# Patient Record
Sex: Female | Born: 1975 | Race: Black or African American | Hispanic: No | Marital: Single | Smoking: Never smoker
Health system: Southern US, Community
[De-identification: ages and names within clinical notes are randomized; demographics above are authoritative.]

## PROBLEM LIST (undated history)

## (undated) DIAGNOSIS — A6009 Herpesviral infection of other urogenital tract: Secondary | ICD-10-CM

## (undated) DIAGNOSIS — E785 Hyperlipidemia, unspecified: Secondary | ICD-10-CM

## (undated) DIAGNOSIS — J0100 Acute maxillary sinusitis, unspecified: Secondary | ICD-10-CM

## (undated) DIAGNOSIS — IMO0002 Reserved for concepts with insufficient information to code with codable children: Secondary | ICD-10-CM

## (undated) DIAGNOSIS — D649 Anemia, unspecified: Secondary | ICD-10-CM

## (undated) DIAGNOSIS — A5901 Trichomonal vulvovaginitis: Secondary | ICD-10-CM

## (undated) DIAGNOSIS — L309 Dermatitis, unspecified: Secondary | ICD-10-CM

## (undated) DIAGNOSIS — J209 Acute bronchitis, unspecified: Secondary | ICD-10-CM

## (undated) DIAGNOSIS — R112 Nausea with vomiting, unspecified: Secondary | ICD-10-CM

## (undated) DIAGNOSIS — R229 Localized swelling, mass and lump, unspecified: Secondary | ICD-10-CM

## (undated) DIAGNOSIS — Z9889 Other specified postprocedural states: Secondary | ICD-10-CM

## (undated) DIAGNOSIS — I10 Essential (primary) hypertension: Secondary | ICD-10-CM

## (undated) DIAGNOSIS — R7303 Prediabetes: Secondary | ICD-10-CM

## (undated) DIAGNOSIS — N390 Urinary tract infection, site not specified: Secondary | ICD-10-CM

## (undated) HISTORY — DX: Essential (primary) hypertension: I10

## (undated) HISTORY — DX: Reserved for concepts with insufficient information to code with codable children: IMO0002

## (undated) HISTORY — DX: Hyperlipidemia, unspecified: E78.5

## (undated) HISTORY — PX: BREAST SURGERY: SHX581

## (undated) HISTORY — DX: Localized swelling, mass and lump, unspecified: R22.9

## (undated) HISTORY — PX: WISDOM TOOTH EXTRACTION: SHX21

---

## 1998-12-24 ENCOUNTER — Encounter: Payer: Self-pay | Admitting: Emergency Medicine

## 1998-12-24 ENCOUNTER — Emergency Department (HOSPITAL_COMMUNITY): Admission: EM | Admit: 1998-12-24 | Discharge: 1998-12-24 | Payer: Self-pay | Admitting: Emergency Medicine

## 1999-03-02 ENCOUNTER — Inpatient Hospital Stay (HOSPITAL_COMMUNITY): Admission: AD | Admit: 1999-03-02 | Discharge: 1999-03-02 | Payer: Self-pay | Admitting: *Deleted

## 2000-04-02 ENCOUNTER — Emergency Department (HOSPITAL_COMMUNITY): Admission: EM | Admit: 2000-04-02 | Discharge: 2000-04-02 | Payer: Self-pay | Admitting: Emergency Medicine

## 2000-04-03 ENCOUNTER — Encounter: Payer: Self-pay | Admitting: Emergency Medicine

## 2000-12-05 ENCOUNTER — Inpatient Hospital Stay (HOSPITAL_COMMUNITY): Admission: AD | Admit: 2000-12-05 | Discharge: 2000-12-05 | Payer: Self-pay | Admitting: Obstetrics & Gynecology

## 2001-08-05 HISTORY — PX: BREAST EXCISIONAL BIOPSY: SUR124

## 2001-08-24 ENCOUNTER — Inpatient Hospital Stay (HOSPITAL_COMMUNITY): Admission: AD | Admit: 2001-08-24 | Discharge: 2001-08-24 | Payer: Self-pay | Admitting: Obstetrics & Gynecology

## 2002-05-18 ENCOUNTER — Inpatient Hospital Stay (HOSPITAL_COMMUNITY): Admission: AD | Admit: 2002-05-18 | Discharge: 2002-05-18 | Payer: Self-pay | Admitting: *Deleted

## 2002-05-20 ENCOUNTER — Encounter: Admission: RE | Admit: 2002-05-20 | Discharge: 2002-05-20 | Payer: Self-pay | Admitting: Obstetrics and Gynecology

## 2002-05-21 ENCOUNTER — Encounter: Admission: RE | Admit: 2002-05-21 | Discharge: 2002-05-21 | Payer: Self-pay | Admitting: Family Medicine

## 2002-05-21 ENCOUNTER — Encounter: Payer: Self-pay | Admitting: Family Medicine

## 2002-06-15 ENCOUNTER — Encounter (HOSPITAL_BASED_OUTPATIENT_CLINIC_OR_DEPARTMENT_OTHER): Payer: Self-pay | Admitting: General Surgery

## 2002-06-21 ENCOUNTER — Encounter (HOSPITAL_BASED_OUTPATIENT_CLINIC_OR_DEPARTMENT_OTHER): Payer: Self-pay | Admitting: General Surgery

## 2002-06-21 ENCOUNTER — Encounter (INDEPENDENT_AMBULATORY_CARE_PROVIDER_SITE_OTHER): Payer: Self-pay | Admitting: Specialist

## 2002-06-21 ENCOUNTER — Ambulatory Visit (HOSPITAL_COMMUNITY): Admission: RE | Admit: 2002-06-21 | Discharge: 2002-06-21 | Payer: Self-pay | Admitting: General Surgery

## 2002-06-21 ENCOUNTER — Encounter: Admission: RE | Admit: 2002-06-21 | Discharge: 2002-06-21 | Payer: Self-pay | Admitting: General Surgery

## 2002-11-26 ENCOUNTER — Emergency Department (HOSPITAL_COMMUNITY): Admission: EM | Admit: 2002-11-26 | Discharge: 2002-11-26 | Payer: Self-pay | Admitting: Emergency Medicine

## 2002-12-28 ENCOUNTER — Other Ambulatory Visit: Admission: RE | Admit: 2002-12-28 | Discharge: 2002-12-28 | Payer: Self-pay | Admitting: Obstetrics and Gynecology

## 2003-02-28 ENCOUNTER — Encounter: Payer: Self-pay | Admitting: Obstetrics and Gynecology

## 2003-02-28 ENCOUNTER — Ambulatory Visit (HOSPITAL_COMMUNITY): Admission: RE | Admit: 2003-02-28 | Discharge: 2003-02-28 | Payer: Self-pay | Admitting: Obstetrics and Gynecology

## 2003-03-24 ENCOUNTER — Emergency Department (HOSPITAL_COMMUNITY): Admission: EM | Admit: 2003-03-24 | Discharge: 2003-03-24 | Payer: Self-pay | Admitting: Emergency Medicine

## 2003-03-24 ENCOUNTER — Encounter: Payer: Self-pay | Admitting: General Surgery

## 2003-05-10 ENCOUNTER — Inpatient Hospital Stay (HOSPITAL_COMMUNITY): Admission: AD | Admit: 2003-05-10 | Discharge: 2003-05-10 | Payer: Self-pay | Admitting: Internal Medicine

## 2003-06-29 ENCOUNTER — Inpatient Hospital Stay (HOSPITAL_COMMUNITY): Admission: AD | Admit: 2003-06-29 | Discharge: 2003-06-29 | Payer: Self-pay | Admitting: Obstetrics and Gynecology

## 2003-07-09 ENCOUNTER — Inpatient Hospital Stay (HOSPITAL_COMMUNITY): Admission: AD | Admit: 2003-07-09 | Discharge: 2003-07-14 | Payer: Self-pay | Admitting: Obstetrics and Gynecology

## 2003-07-11 ENCOUNTER — Encounter (INDEPENDENT_AMBULATORY_CARE_PROVIDER_SITE_OTHER): Payer: Self-pay | Admitting: Specialist

## 2004-01-13 ENCOUNTER — Other Ambulatory Visit: Admission: RE | Admit: 2004-01-13 | Discharge: 2004-01-13 | Payer: Self-pay | Admitting: Obstetrics and Gynecology

## 2004-03-23 ENCOUNTER — Emergency Department (HOSPITAL_COMMUNITY): Admission: EM | Admit: 2004-03-23 | Discharge: 2004-03-23 | Payer: Self-pay | Admitting: Emergency Medicine

## 2004-03-24 ENCOUNTER — Emergency Department (HOSPITAL_COMMUNITY): Admission: EM | Admit: 2004-03-24 | Discharge: 2004-03-25 | Payer: Self-pay | Admitting: Emergency Medicine

## 2005-03-27 ENCOUNTER — Other Ambulatory Visit: Admission: RE | Admit: 2005-03-27 | Discharge: 2005-03-27 | Payer: Self-pay | Admitting: Obstetrics and Gynecology

## 2005-04-30 ENCOUNTER — Emergency Department (HOSPITAL_COMMUNITY): Admission: EM | Admit: 2005-04-30 | Discharge: 2005-04-30 | Payer: Self-pay | Admitting: Emergency Medicine

## 2005-05-21 ENCOUNTER — Ambulatory Visit (HOSPITAL_COMMUNITY): Admission: RE | Admit: 2005-05-21 | Discharge: 2005-05-21 | Payer: Self-pay | Admitting: General Surgery

## 2005-05-21 HISTORY — PX: MASS EXCISION: SHX2000

## 2005-05-22 ENCOUNTER — Ambulatory Visit (HOSPITAL_COMMUNITY): Admission: RE | Admit: 2005-05-22 | Discharge: 2005-05-22 | Payer: Self-pay | Admitting: General Surgery

## 2005-08-04 ENCOUNTER — Emergency Department (HOSPITAL_COMMUNITY): Admission: EM | Admit: 2005-08-04 | Discharge: 2005-08-04 | Payer: Self-pay | Admitting: Emergency Medicine

## 2005-08-05 ENCOUNTER — Emergency Department (HOSPITAL_COMMUNITY): Admission: EM | Admit: 2005-08-05 | Discharge: 2005-08-05 | Payer: Self-pay | Admitting: Emergency Medicine

## 2007-05-15 ENCOUNTER — Emergency Department (HOSPITAL_COMMUNITY): Admission: EM | Admit: 2007-05-15 | Discharge: 2007-05-16 | Payer: Self-pay | Admitting: Emergency Medicine

## 2008-03-17 ENCOUNTER — Emergency Department (HOSPITAL_COMMUNITY): Admission: EM | Admit: 2008-03-17 | Discharge: 2008-03-17 | Payer: Self-pay | Admitting: Emergency Medicine

## 2009-02-21 ENCOUNTER — Emergency Department (HOSPITAL_COMMUNITY): Admission: EM | Admit: 2009-02-21 | Discharge: 2009-02-21 | Payer: Self-pay | Admitting: Emergency Medicine

## 2009-07-16 ENCOUNTER — Inpatient Hospital Stay (HOSPITAL_COMMUNITY): Admission: AD | Admit: 2009-07-16 | Discharge: 2009-07-16 | Payer: Self-pay | Admitting: Obstetrics and Gynecology

## 2010-08-26 ENCOUNTER — Encounter: Payer: Self-pay | Admitting: Obstetrics and Gynecology

## 2010-11-06 LAB — CBC
HCT: 34.8 % — ABNORMAL LOW (ref 36.0–46.0)
Hemoglobin: 11.5 g/dL — ABNORMAL LOW (ref 12.0–15.0)
MCHC: 33 g/dL (ref 30.0–36.0)
RDW: 14.4 % (ref 11.5–15.5)

## 2010-11-06 LAB — URINE CULTURE
Colony Count: NO GROWTH
Culture: NO GROWTH

## 2010-11-06 LAB — URINE MICROSCOPIC-ADD ON

## 2010-11-06 LAB — WET PREP, GENITAL

## 2010-11-06 LAB — URINALYSIS, ROUTINE W REFLEX MICROSCOPIC
Nitrite: POSITIVE — AB
Protein, ur: 100 mg/dL — AB
Urobilinogen, UA: 0.2 mg/dL (ref 0.0–1.0)

## 2010-11-11 LAB — DIFFERENTIAL
Basophils Absolute: 0 10*3/uL (ref 0.0–0.1)
Basophils Relative: 0 % (ref 0–1)
Eosinophils Relative: 1 % (ref 0–5)
Lymphocytes Relative: 34 % (ref 12–46)

## 2010-11-11 LAB — BASIC METABOLIC PANEL
BUN: 12 mg/dL (ref 6–23)
CO2: 28 mEq/L (ref 19–32)
Calcium: 9.3 mg/dL (ref 8.4–10.5)
GFR calc non Af Amer: >60 mL/min (ref >60)
Glucose, Bld: 94 mg/dL (ref 70–99)
Potassium: 3.8 mEq/L (ref 3.5–5.1)

## 2010-11-11 LAB — CBC
HCT: 39.2 % (ref 36.0–46.0)
MCHC: 32.9 g/dL (ref 30.0–36.0)
Platelets: 266 10*3/uL (ref 150–400)
RDW: 14.5 % (ref 11.5–15.5)

## 2010-12-21 NOTE — Consult Note (Signed)
NAME:  [name], [name]                        ACCOUNT NO.:  [id number]   MEDICAL RECORD NO.:  [id number]                   PATIENT TYPE:  EMS   LOCATION:  ED                                   FACILITY:  [facility]   PHYSICIAN:  Angelia Mould. Derrell Lolling, M.D.             DATE OF BIRTH:  1975-11-28   DATE OF CONSULTATION:  03/24/2003  DATE OF DISCHARGE:                                   CONSULTATION   CHIEF COMPLAINT:  Right-sided abdominal pain.   HISTORY OF PRESENT ILLNESS:  This is a 35 year old black female who is  gravida 1, para 0, abortus 0.  She is [redacted] weeks gestation, and is being  followed by Dr. Tracey Harries.  She complains of a 48-hour history of right  lower quadrant abdominal pain.  She states the pain began somewhat suddenly  48 hours ago, and then later moved all around the abdomen.  The pain has  been progressive.  Denies vaginal bleeding.  Denies prior similar problems.  She has been a little bit constipated.  Denies any diarrhea or cramps.   Yesterday, she ate well, despite the pain.  She had hot dogs for supper, and  had no problem.  Late last night she awoke, felt like she was choking on  some of her saliva, and vomited one time.  She has been somewhat anorexic  since, but denies any nausea or vomiting since that time.  She denies fever  or chills.  She has not had any trouble urinating.  She saw Dr. Ambrose Mantle in  the office today.  He was concerned that she might have either a  cholecystitis or appendicitis.  He sent her to the [facility] ER and asked  me to evaluate her.   PAST HISTORY:  This is her first pregnancy.  She has had a left breast  biopsy for papilloma in the past.  She has had surgery on her right eye when  she was younger.   CURRENT MEDICATIONS:  1. She takes penicillin because of group B streptococcus on vaginal smear.  2. Prenatal vitamins.   DRUG ALLERGIES:  None.   SOCIAL HISTORY:  She is not married.  She lives in [place].  She denies  the use  of alcohol or tobacco.  She works in the call center for [employer].   FAMILY HISTORY:  Mother living and has hypertension.  Father living and  well.  One sister and two brothers living and well.   REVIEW OF SYSTEMS:  All systems are reviewed.  They are noncontributory,  except as described above.   PHYSICAL EXAMINATION:  GENERAL:  Pleasant, young, somewhat overweight black  female in minimal distress.  VITAL SIGNS:  Temperature 98.8, heart rate 120 and regular, blood pressure  144/86, respiratory rate 22.  HEENT:  Eyes - sclerae clear.  Extraocular movements intact.  Ears, nose,  mouth, and throat - the nose, lips,  tongue, and oropharynx are without gross  lesions.  NECK:  Supple, nontender.  No thyroid mass, no adenopathy.  Trachea midline.  RESPIRATORY:  Lungs clear to auscultation.  No CVA tenderness.  No chest  wall tenderness.  HEART:  Regular rate and rhythm.  Somewhat tachycardic.  No murmur.  BREASTS:  Not examined.  ABDOMEN:  Gravid.  Hypoactive bowel sounds.  She is tender everywhere on the  right side, and lesser on the epigastrium, and not at all on the left side.  I really cannot tell if she is more tender in the right lower quadrant than  the right upper quadrant, but she does guard.  GENITOURINARY:  There is no vaginal bleeding.  There is no inguinal hernia  or inguinal adenopathy.  EXTREMITIES:  She moves all four extremities well.  No deformity.  Peripheral pulses are intact.  No edema.  NEUROLOGIC:  No gross motor or sensory deficits.   DATA:  White blood cell count is 14,100 with a normal differential.  Urinalysis is unremarkable.  Complete metabolic panel is unremarkable.   Abdominal and pelvic ultrasound shows a viable pregnancy.  The gallbladder  looks normal.  There is a large right-sided uterine fibroid that is probably  at least 10 cm in size.   CT scan with contrast shows no evidence for acute inflammatory process, but  does also show the very  large right-sided uterine fibroid.  There is no free  fluid.  No evidence of any soft tissue stranding or thickening of small  bowel or large bowel.  The liver and gallbladder and stomach look fine.   ASSESSMENT:  1. Right-sided abdominal pain secondary to large uterine fibroid.  Question     whether this may be infarcting or having intra-fibroid hemorrhage.  2. Viable pregnancy.  3. No evidence for acute inflammatory process of the alimentary tract.   PLAN:  1. The patient's care was discussed with Dr. Tracey Harries.  2. She will be discharged home.  3. Prescription for Percocet given to patient.  4. She will follow up with Dr. Ambrose Mantle tomorrow.                                               Angelia Mould. Derrell Lolling, M.D.    HMI/MEDQ  D:  03/24/2003  T:  03/24/2003  Job:  [id number]   cc:   [name], M.D.  [address]  [address]  [place]  [place] [address]  Fax: [phone]

## 2010-12-21 NOTE — Discharge Summary (Signed)
[name], [name]                        ACCOUNT NO.:  [id number]   MEDICAL RECORD NO.:  [id number]                   PATIENT TYPE:  INP   LOCATION:  9136                                 FACILITY:  [facility]   PHYSICIAN:  Zenaida Niece, M.D.             DATE OF BIRTH:  [birth year]   DATE OF ADMISSION:  07/09/2003  DATE OF DISCHARGE:  07/14/2003                                 DISCHARGE SUMMARY   ADMISSION DIAGNOSES:  1. Intrauterine pregnancy at 37 weeks.  2. Premature rupture of membranes.  3. Group B Streptococcus carrier.   DISCHARGE DIAGNOSES:  1. Intrauterine pregnancy at 37 weeks.  2. Premature rupture of membranes.  3. Group B Streptococcus carrier.  4. Chorioamnionitis.  5. Arrest of descent.  6. Leiomyomatous uterus.   PROCEDURE:  On July 11, 2003, she had a primary low-transverse cesarean  section.   HISTORY:  This is a 35 year old black female, gravida 2, para 0-0-0-1, with  an EGA of 37+ weeks who presents with a complaint of a gush of fluid at  13:00 on December 4. She had a small leak on December 3 at 18:00, but that  was thicker and that fluid stopped. However, the leak on the day of  admission kept coming. Evaluation in triage revealed positive amniotic fluid  with positive nitrazine and the cervix was 1, thick, and high with a vertex  presentation by ultrasound.   Prenatal care complicated by group B Streptococcus in her urine and  abdominal pain at 22 weeks which was probably felt to be due to a  degenerating fibroid. She also had several yeast infections, but no  significant major complications.   PRENATAL LABORATORY:  Blood type is O positive with negative antibody  screen. Sickle screen is negative.  RPR nonreactive. Rubella immune.  Hepatitis B surface antigen negative. HIV negative. Gonorrhea and Chlamydia  negative. Triple screen normal. One hour Glucola 30 and group B  Streptococcus was positive.   PAST OBSTETRIC HISTORY:  One elective  termination.   GYNECOLOGIC HISTORY:  History of Chlamydia and history of cryotherapy in  college.   PAST MEDICAL HISTORY:  Negative.   PAST SURGICAL HISTORY:  Left breast tumor removed in November 2003.   ALLERGIES:  None known.   MEDICATIONS:  None.   FAMILY HISTORY:  Brother has sickle cell disease.   PHYSICAL EXAMINATION:  VITAL SIGNS:  She is afebrile with stable vital  signs. Fetal heart tracing reactive with occasional contractions.  ABDOMEN:  Gravid, nontender with fundal height of 39 cm and an estimated  fetal weight of 8 pounds, but this is difficult due to her uterine myomas.  PELVIC:  My vaginal examination was initially deferred.   HOSPITAL COURSE:  The patient was admitted and started on Pitocin for  induction due to rupture of membranes without contractions. She was also put  on penicillin for group B Streptococcus prophylaxis.  On the morning  of  December 5, I was able to perform amniotomy of a full bag and at that time  she was 2, complete, and -1 to -2.  She had a slightly protracted course,  but did progress throughout the day to complete and pushed for approximately  two hours, but was not able to bring the baby past a +1 station. She did  also develop a fever towards the end of labor and her penicillin was changed  to Unasyn.   Early on the morning of December 6 she underwent a primary low-transverse  cesarean section under epidural anesthesia with an estimated blood loss of  800 mL. There was a large right fundal pedunculated myoma and a smaller left  fundal myoma. She had bilateral adnexal adhesions and possible endometriosis  with normal ovaries. She delivered a viable female infant with APGARS 7/9  and weight 7 pounds 2 ounces.   Postoperatively she had no complications. She as continued on Unasyn for 24  hours, but remained afebrile. Preoperative hemoglobin was 11.0 and  postoperative was 10.5.  On the morning of postoperative day #3, the patient   requested discharge. At that time her incision was healing well, staples  were removed, and Steri-Strips applied.   DISCHARGE INSTRUCTIONS:  Regular diet, pelvic rest, no strenuous activity.  Followup is in 10-14 days for an incision check. Medications are Percocet  #30 1-2 p.o. q.4-6 h. p.r.n. pain, ibuprofen as needed for pain,  spironolactone 100 mg #10 one p.o. daily p.r.n. swelling, and she is given  our discharge pamphlet.                                               Zenaida Niece, M.D.    TDM/MEDQ  D:  07/14/2003  T:  07/14/2003  Job:  [id number]

## 2010-12-21 NOTE — Op Note (Signed)
NAME:  [name], [name]                        ACCOUNT NO.:  [id number]   MEDICAL RECORD NO.:  [id number]                   PATIENT TYPE:  INP   LOCATION:  9136                                 FACILITY:  [facility]   PHYSICIAN:  Zenaida Niece, M.D.             DATE OF BIRTH:  1975-09-17   DATE OF PROCEDURE:  07/11/2003  DATE OF DISCHARGE:                                 OPERATIVE REPORT   PREOPERATIVE DIAGNOSES:  1. Intrauterine pregnancy at 37+ weeks.  2. Arrest of descent.  3. Chorioamnionitis.  4. Leiomyomatous uterus.   POSTOPERATIVE DIAGNOSES:  1. Intrauterine pregnancy at 37+ weeks.  2. Arrest of descent.  3. Chorioamnionitis.  4. Leiomyomatous uterus.   PROCEDURE:  Primary low transverse cesarean section without extensions.   SURGEON:  Zenaida Niece, M.D.   ANESTHESIA:  Epidural.   ESTIMATED BLOOD LOSS:  800 mL.   FINDINGS:  The patient had a large right fundal pedunculated myoma and a  smaller left fundal myoma.  There were bilateral adnexal adhesions and  lesions on the uterus consistent with endometriosis.  She had normal-  appearing tubes and ovaries except for the adhesions.  She delivered a  viable female infant with Apgars of 7 and 9 and weight 7 pounds 2 ounces,  and there was a nuchal cord x1.   PROCEDURE IN DETAIL:  The patient was taken to the operating room and placed  in the dorsal supine position.  Her previously-placed epidural was dosed  appropriately.  Abdomen was prepped and draped in the usual sterile fashion.  The level of her anesthesia was found to be adequate and her abdomen was  entered via standard Pfannenstiel incision.  The bladder blade was placed  and a transverse incision was made in the lower uterine segment.  Once the  uterine cavity was entered, the incision was extended bilaterally digitally.  The fetal vertex was then grasped and delivered through the incision  atraumatically.  Mouth and nares were suctioned and the remainder of  the  infant delivered atraumatically after a loose nuchal cord x1 was reduced.  Cord was doubly clamped and cut and the infant handed to the awaiting  pediatric team.  Cord blood and a segment of cord for gas were obtained and  the placenta delivered spontaneously.  The uterus was wiped dry with a clean  lap pad and all clots and debris removed.  The uterine incision was  inspected and found to be free of extension.  The uterine incision was then  closed in one layer, being a running locking layer, with #1 chromic, with  adequate hemostasis.  Bleeding from serosal edges and several friable  lesions on the anterior uterine surface were controlled with electrocautery.  A large myoma was palpated right fundus and a smaller one left fundus.  The  uterine incision was again inspected and found to be hemostatic.  Both tubes  and ovaries were  inspected and found to be normal except for some filmy  adhesions.  The subfascial space was then irrigated and made hemostatic with  electrocautery.  The fascia was closed in a running fashion starting at both  ends and meeting in the middle with 0 Vicryl.  The subcutaneous tissue was  then irrigated and made hemostatic with electrocautery.  Subcutaneous tissue  was closed with running 3-0 plain gut, which did break at the end of the  suture.  The skin incision was then closed with staples and a sterile  dressing.  The patient tolerated the procedure well and was taken to the  recovery room in stable condition.  Counts were correct x2, and she received  Unasyn as a scheduled dose approximately three hours postop and will be  continued on this postoperatively.                                               Zenaida Niece, M.D.    TDM/MEDQ  D:  07/11/2003  T:  07/11/2003  Job:  [id number]

## 2010-12-21 NOTE — Op Note (Signed)
   NAME:  [name], [name]                        ACCOUNT NO.:  [id number]   MEDICAL RECORD NO.:  [id number]                   PATIENT TYPE:  OIB   LOCATION:  2899                                 FACILITY:  [facility]   PHYSICIAN:  Leonie Man, M.D.                DATE OF BIRTH:  August 28, 1975   DATE OF PROCEDURE:  06/21/2002  DATE OF DISCHARGE:                                 OPERATIVE REPORT   PREOPERATIVE DIAGNOSIS:  Duct papilloma of left breast.   POSTOPERATIVE DIAGNOSIS:  Duct papilloma of left breast, pathology pending.   PROCEDURE:  Duct excision of left breast.   SURGEON:  Leonie Man, M.D.   ASSISTANT:  Nurse.   ANESTHESIA:  General.   INDICATIONS FOR PROCEDURE:  The patient is a 35 year old who presents with a  bloody nipple discharge, who on ductography is noted to have a papilloma  within the duct, from which the discharge is coming.  She comes to the  operating room now after the risks and potential benefits of surgery have  been fully discussed and a consent obtained.  She also had installation of  methylene blue dye into the effected duct, so as to mark the duct's course.   DESCRIPTION OF PROCEDURE:  Following the induction of satisfactory general  anesthesia with the patient positioned supinely, the left breast is prepped  and draped to be included in the sterile operative field.  An elliptical  incision is made around the region of the effected duct, after a lacrimal  duct probe was placed down into the duct itself.  This incision was taken  through the skin and the subcutaneous tissue, deepening it into the  substance of the nipple and the subareolar tissues, and dissecting free the  entire effected duct as well as surrounding tissues, those inspissated with  blue dye.  This entire specimen was removed and forwarded for pathologic  evaluation.  Hemostasis was obtained with electrocautery.  The sponge,  instrument and sharps were verified.  The breast tissues  were reapproximated  with interrupted #3-0 Vicryl sutures, and the skin and nipple reconstructed  with running #5-0 Monocryl suture.  Sterile dressings were then applied.  The anesthetic was reversed.  The patient was removed from the operating  room to the recovery room in stable condition.  She tolerated the procedure  well.                                                Leonie Man, M.D.    PB/MEDQ  D:  06/21/2002  T:  06/21/2002  Job:  [id number]

## 2010-12-21 NOTE — Op Note (Signed)
NAMEEVER, HALBERG              ACCOUNT NO.:  [id number]   MEDICAL RECORD NO.:  [id number]          PATIENT TYPE:  OIB   LOCATION:  0098                         FACILITY:  [facility]   PHYSICIAN:  Sharlet Salina T. Hoxworth, M.D.DATE OF BIRTH:  [birth year]   DATE OF PROCEDURE:  05/22/2005  DATE OF DISCHARGE:                                 OPERATIVE REPORT   PREOPERATIVE DIAGNOSIS:  Abscess, left thigh.   POSTOPERATIVE DIAGNOSIS:  Abscess, left thigh.   SURGICAL PROCEDURE:  Irrigation and drainage of abscess, left thigh.   HISTORY OF PRESENT ILLNESS:  [name] is a 35 year old black female  who presented with a 2-3 day history of increasing pain, swelling and  tenderness in the medial upper left thigh. Examination reveals about a 5-6  cm area of induration, erythema and tenderness consistent with a deep  subcutaneous abscess.  I&D has been recommended.  She is taken to the  operating room for this procedure.  She also has a tiny pustule in a mirror  image site on the right thigh that we will unroof.   DESCRIPTION OF PROCEDURE:  The patient was brought to the operating room and  placed supine on the operating table.  General endotracheal anesthesia was  induced.  She received broad-spectrum antibiotics preoperatively. She was  positioned supine frog leg and both inner thighs sterilely prepped and  draped.  The incision was made into the deep subcu directly over the area of  induration the right thigh.  A moderate amount of purulent material drained.  This was cultured.  A few loculations were broken up and the area completely  opened to drain with the abscess cavity measuring about 3 cm.  Hemostasis  was obtained with cautery.  Soft tissue was infiltrated with Marcaine.  The  wound was packed with moist saline gauze. The small pustule on the right  thigh was unroofed.  Dry sterile dressings were applied. Taken recovery room  in good condition.      Lorne Skeens. Hoxworth, M.D.  Electronically Signed     BTH/MEDQ  D:  05/22/2005  T:  05/22/2005  Job:  [id number]

## 2011-05-03 LAB — URINALYSIS, ROUTINE W REFLEX MICROSCOPIC
Ketones, ur: NEGATIVE
Nitrite: NEGATIVE
Protein, ur: NEGATIVE
Urobilinogen, UA: 1
pH: 7

## 2011-05-03 LAB — POCT I-STAT, CHEM 8
BUN: 7
Calcium, Ion: 1.19
Creatinine, Ser: 0.9
Glucose, Bld: 120 — ABNORMAL HIGH
Hemoglobin: 12.9
Sodium: 141
TCO2: 25

## 2011-05-09 ENCOUNTER — Encounter (INDEPENDENT_AMBULATORY_CARE_PROVIDER_SITE_OTHER): Payer: Self-pay | Admitting: General Surgery

## 2011-05-09 ENCOUNTER — Ambulatory Visit (INDEPENDENT_AMBULATORY_CARE_PROVIDER_SITE_OTHER): Payer: 59 | Admitting: General Surgery

## 2011-05-09 VITALS — BP 136/88 | HR 64 | Temp 97.7°F | Resp 16 | Ht 64.5 in | Wt 238.6 lb

## 2011-05-09 DIAGNOSIS — D1779 Benign lipomatous neoplasm of other sites: Secondary | ICD-10-CM

## 2011-05-09 DIAGNOSIS — D1720 Benign lipomatous neoplasm of skin and subcutaneous tissue of unspecified limb: Secondary | ICD-10-CM

## 2011-05-09 NOTE — Progress Notes (Signed)
Subjective:   Lump right thigh  Patient ID: [name], female   DOB: [birth year], 35 y.o.   MRN: [id number]  HPI Patient is a 35 year old female who for several years has had a gradually enlarging visible and palpable lump in her medial right thigh. It gives her some mild discomfort rubbing against her upper leg.   She also has a history of an abscess drained from a similar area on the left leg by me in 2006  Past Medical History  Diagnosis Date  . Hyperlipidemia   . Hypertension   . Mass     on rt inner thigh   Past Surgical History  Procedure Date  . Mass excision 05/21/05    inner lt thigh  . Cesarean section 07/11/03  . Breast surgery     left breast papilloma   Current Outpatient Prescriptions  Medication Sig Dispense Refill  . olmesartan (BENICAR) 40 MG tablet Take 40 mg by mouth daily.         No Known Allergies History  Substance Use Topics  . Smoking status: Never Smoker   . Smokeless tobacco: Not on file  . Alcohol Use: No    lReview of Systems  Respiratory: Negative.   Cardiovascular: Negative.        Objective:   Physical Exam General: Morbidly obese African female in no distress Skin: Warm and dry no rash infection Lungs: Clear without wheezing or increased work of breathing Cardiovascular: Regular rate and rhythm without murmur Extremities: There is a healed incision in the medial left thigh. In the proximal medial right thigh is an approximately 5 cm subcutaneous soft tissue mass fleshy and freely movable with some stretching of the overlying skin.   Assessment:     Enlarging symptomatic subcutaneous mass of the right thigh consistent with a lipoma. I think this should be removed due to enlargement symptoms. This was discussed with the patient including risks of bleeding and infection. She is in agreement. We'll plan general anesthesia.    Plan:     Excision of lipoma of the right thigh under general anesthesia. Due to stretching of the skin  will plan a limited skin excision as well.

## 2011-05-09 NOTE — Patient Instructions (Signed)

## 2011-08-07 ENCOUNTER — Telehealth (INDEPENDENT_AMBULATORY_CARE_PROVIDER_SITE_OTHER): Payer: Self-pay | Admitting: General Surgery

## 2011-08-08 ENCOUNTER — Other Ambulatory Visit (INDEPENDENT_AMBULATORY_CARE_PROVIDER_SITE_OTHER): Payer: Self-pay | Admitting: General Surgery

## 2011-08-14 ENCOUNTER — Telehealth (INDEPENDENT_AMBULATORY_CARE_PROVIDER_SITE_OTHER): Payer: Self-pay

## 2011-08-14 NOTE — Telephone Encounter (Signed)
Left voice message to call our office, patient has been scheduled for pre-op consult/follow up on 08/22/11 @ 10:00 w/Dr. Johna Sheriff.

## 2011-08-15 ENCOUNTER — Telehealth (INDEPENDENT_AMBULATORY_CARE_PROVIDER_SITE_OTHER): Payer: Self-pay

## 2011-08-15 NOTE — Telephone Encounter (Signed)
Called both home and mobile phone numbers on file to discuss scheduling surgery, patient not available, left voicemail on both numbers.  Patient scheduled for appointment w/Dr. Johna Sheriff on 08/22/11.

## 2011-08-15 NOTE — Telephone Encounter (Signed)
Called Ms. Rayson at both phone numbers we have on file, left voice message for her to give [place] a call back.  I have her scheduled to discuss surgery/pre-op appt w/Dr. Johna Sheriff on 08/22/11.

## 2011-08-22 ENCOUNTER — Encounter (INDEPENDENT_AMBULATORY_CARE_PROVIDER_SITE_OTHER): Payer: 59 | Admitting: General Surgery

## 2011-09-20 ENCOUNTER — Telehealth (INDEPENDENT_AMBULATORY_CARE_PROVIDER_SITE_OTHER): Payer: Self-pay | Admitting: General Surgery

## 2011-11-18 ENCOUNTER — Telehealth (INDEPENDENT_AMBULATORY_CARE_PROVIDER_SITE_OTHER): Payer: Self-pay | Admitting: General Surgery

## 2011-11-21 ENCOUNTER — Telehealth (INDEPENDENT_AMBULATORY_CARE_PROVIDER_SITE_OTHER): Payer: Self-pay

## 2011-11-21 NOTE — Telephone Encounter (Signed)
Numerous attempts to contact patient for pre-op appointment since January 2013.  Patient spoke to our surgery scheduling stating she did not feel she needed a pre-op appointment.  Patient was scheduled for an appointment which she later cxl'd.

## 2011-11-21 NOTE — Telephone Encounter (Signed)
Return [name] call -- called [name], no answer, left message to call our office.  [name] would like appointment w/Dr. Johna Sheriff to discuss scheduling her for surgery.  Scheduled for 01/03/12 1st available.  (please see previous telephone encounters)

## 2011-11-21 NOTE — Telephone Encounter (Signed)
Called patient, no answer, left voice message to call our office.

## 2012-01-03 ENCOUNTER — Encounter (INDEPENDENT_AMBULATORY_CARE_PROVIDER_SITE_OTHER): Payer: Self-pay | Admitting: General Surgery

## 2012-01-03 ENCOUNTER — Other Ambulatory Visit: Payer: Self-pay | Admitting: Obstetrics and Gynecology

## 2012-01-03 ENCOUNTER — Ambulatory Visit (INDEPENDENT_AMBULATORY_CARE_PROVIDER_SITE_OTHER): Payer: 59 | Admitting: General Surgery

## 2012-01-03 VITALS — BP 130/80 | HR 80 | Temp 98.1°F | Ht 64.5 in | Wt 214.8 lb

## 2012-01-03 DIAGNOSIS — Z1231 Encounter for screening mammogram for malignant neoplasm of breast: Secondary | ICD-10-CM

## 2012-01-03 DIAGNOSIS — D1779 Benign lipomatous neoplasm of other sites: Secondary | ICD-10-CM

## 2012-01-03 DIAGNOSIS — D1720 Benign lipomatous neoplasm of skin and subcutaneous tissue of unspecified limb: Secondary | ICD-10-CM

## 2012-01-03 NOTE — Progress Notes (Signed)
Chief complaint: Lipoma right thigh  History: Patient returns with a history of a symptomatic lipoma on her right thigh. We had previously planned surgery but she did not follow through with this. She states she's having some increasing irritation and discomfort with it rubbing against her other leg.  Exam: BP 130/80  Pulse 80  Temp(Src) 98.1 F (36.7 C) (Temporal)  Ht 5' 4.5" (1.638 m)  Wt 214 lb 12.8 oz (97.433 kg)  BMI 36.30 kg/m2  SpO2 98%  Gen.: Significant only for moderate obesity Extremities: In the medial right thigh is again noted an approximate 5 cm fleshy subcutaneous mass with some overlying skin stretching and enlargement consistent with a lipoma.  Assessment and plan: Symptomatic subcutaneous mass right thigh consistent with a lipoma. This needs to be removed to relieve symptoms and confirm the diagnosis. I discussed the procedure in detail including his bleeding and infection and wound healing problems. We discussed the length of the incision the fact I would remove some of the redundant skin. All her questions were answered is reflected up as scheduled.

## 2012-03-19 ENCOUNTER — Ambulatory Visit
Admission: RE | Admit: 2012-03-19 | Discharge: 2012-03-19 | Disposition: A | Discharge status: Home / Self Care | Payer: 59 | Source: Ambulatory Visit | Attending: Obstetrics and Gynecology | Admitting: Obstetrics and Gynecology

## 2012-03-19 ENCOUNTER — Encounter (INDEPENDENT_AMBULATORY_CARE_PROVIDER_SITE_OTHER): Payer: 59 | Admitting: General Surgery

## 2012-03-19 DIAGNOSIS — Z1231 Encounter for screening mammogram for malignant neoplasm of breast: Secondary | ICD-10-CM

## 2012-03-20 ENCOUNTER — Other Ambulatory Visit: Payer: Self-pay | Admitting: Obstetrics and Gynecology

## 2012-03-20 DIAGNOSIS — R928 Other abnormal and inconclusive findings on diagnostic imaging of breast: Secondary | ICD-10-CM

## 2012-03-27 ENCOUNTER — Ambulatory Visit
Admission: RE | Admit: 2012-03-27 | Discharge: 2012-03-27 | Disposition: A | Discharge status: Home / Self Care | Payer: 59 | Source: Ambulatory Visit | Attending: Obstetrics and Gynecology | Admitting: Obstetrics and Gynecology

## 2012-03-27 ENCOUNTER — Other Ambulatory Visit: Payer: Self-pay | Admitting: Obstetrics and Gynecology

## 2012-03-27 DIAGNOSIS — R928 Other abnormal and inconclusive findings on diagnostic imaging of breast: Secondary | ICD-10-CM

## 2012-11-25 ENCOUNTER — Other Ambulatory Visit: Payer: Self-pay | Admitting: Obstetrics and Gynecology

## 2012-11-25 DIAGNOSIS — D241 Benign neoplasm of right breast: Secondary | ICD-10-CM

## 2012-12-01 ENCOUNTER — Ambulatory Visit
Admission: RE | Admit: 2012-12-01 | Discharge: 2012-12-01 | Disposition: A | Discharge status: Home / Self Care | Payer: 59 | Source: Ambulatory Visit | Attending: Obstetrics and Gynecology | Admitting: Obstetrics and Gynecology

## 2012-12-01 DIAGNOSIS — D241 Benign neoplasm of right breast: Secondary | ICD-10-CM

## 2013-06-24 ENCOUNTER — Other Ambulatory Visit: Payer: Self-pay | Admitting: Obstetrics and Gynecology

## 2013-06-24 DIAGNOSIS — D241 Benign neoplasm of right breast: Secondary | ICD-10-CM

## 2013-07-09 ENCOUNTER — Ambulatory Visit
Admission: RE | Admit: 2013-07-09 | Discharge: 2013-07-09 | Disposition: A | Discharge status: Home / Self Care | Payer: 59 | Source: Ambulatory Visit | Attending: Obstetrics and Gynecology | Admitting: Obstetrics and Gynecology

## 2013-07-09 DIAGNOSIS — D241 Benign neoplasm of right breast: Secondary | ICD-10-CM

## 2014-02-16 ENCOUNTER — Other Ambulatory Visit: Payer: Self-pay | Admitting: Obstetrics and Gynecology

## 2014-02-16 DIAGNOSIS — N6310 Unspecified lump in the right breast, unspecified quadrant: Secondary | ICD-10-CM

## 2014-03-11 ENCOUNTER — Encounter (INDEPENDENT_AMBULATORY_CARE_PROVIDER_SITE_OTHER): Payer: Self-pay

## 2014-03-11 ENCOUNTER — Ambulatory Visit
Admission: RE | Admit: 2014-03-11 | Discharge: 2014-03-11 | Disposition: A | Discharge status: Home / Self Care | Payer: 59 | Source: Ambulatory Visit | Attending: Obstetrics and Gynecology | Admitting: Obstetrics and Gynecology

## 2014-03-11 DIAGNOSIS — N6310 Unspecified lump in the right breast, unspecified quadrant: Secondary | ICD-10-CM

## 2014-10-17 ENCOUNTER — Other Ambulatory Visit: Payer: Self-pay | Admitting: Obstetrics and Gynecology

## 2014-10-18 LAB — CYTOLOGY - PAP

## 2015-03-02 ENCOUNTER — Emergency Department (HOSPITAL_COMMUNITY)
Admission: EM | Admit: 2015-03-02 | Discharge: 2015-03-02 | Disposition: A | Discharge status: Home / Self Care | Payer: 59 | Attending: Emergency Medicine | Admitting: Emergency Medicine

## 2015-03-02 ENCOUNTER — Encounter (HOSPITAL_COMMUNITY): Payer: Self-pay | Admitting: *Deleted

## 2015-03-02 ENCOUNTER — Emergency Department (HOSPITAL_COMMUNITY): Payer: 59

## 2015-03-02 DIAGNOSIS — Z3202 Encounter for pregnancy test, result negative: Secondary | ICD-10-CM | POA: Insufficient documentation

## 2015-03-02 DIAGNOSIS — R079 Chest pain, unspecified: Secondary | ICD-10-CM | POA: Diagnosis present

## 2015-03-02 DIAGNOSIS — Z79899 Other long term (current) drug therapy: Secondary | ICD-10-CM | POA: Insufficient documentation

## 2015-03-02 DIAGNOSIS — I10 Essential (primary) hypertension: Secondary | ICD-10-CM | POA: Diagnosis not present

## 2015-03-02 DIAGNOSIS — Z8639 Personal history of other endocrine, nutritional and metabolic disease: Secondary | ICD-10-CM | POA: Diagnosis not present

## 2015-03-02 DIAGNOSIS — M940 Chondrocostal junction syndrome [Tietze]: Secondary | ICD-10-CM | POA: Insufficient documentation

## 2015-03-02 LAB — I-STAT TROPONIN, ED: TROPONIN I, POC: 0 ng/mL (ref 0.00–0.08)

## 2015-03-02 LAB — CBC WITH DIFFERENTIAL/PLATELET
BASOS ABS: 0 10*3/uL (ref 0.0–0.1)
BASOS PCT: 0 % (ref 0–1)
Eosinophils Absolute: 0 10*3/uL (ref 0.0–0.7)
Eosinophils Relative: 0 % (ref 0–5)
HEMATOCRIT: 35.2 % — AB (ref 36.0–46.0)
HEMOGLOBIN: 11.8 g/dL — AB (ref 12.0–15.0)
LYMPHS ABS: 2.3 10*3/uL (ref 0.7–4.0)
LYMPHS PCT: 31 % (ref 12–46)
MCH: 27.1 pg (ref 26.0–34.0)
MCHC: 33.5 g/dL (ref 30.0–36.0)
MCV: 80.9 fL (ref 78.0–100.0)
MONOS PCT: 5 % (ref 3–12)
Monocytes Absolute: 0.4 10*3/uL (ref 0.1–1.0)
Neutro Abs: 4.9 10*3/uL (ref 1.7–7.7)
Neutrophils Relative %: 64 % (ref 43–77)
Platelets: 275 10*3/uL (ref 150–400)
RBC: 4.35 MIL/uL (ref 3.87–5.11)
RDW: 13.7 % (ref 11.5–15.5)
WBC: 7.6 10*3/uL (ref 4.0–10.5)

## 2015-03-02 LAB — COMPREHENSIVE METABOLIC PANEL
ALBUMIN: 3.1 g/dL — AB (ref 3.5–5.0)
ALK PHOS: 74 U/L (ref 38–126)
ALT: 14 U/L (ref 14–54)
AST: 15 U/L (ref 15–41)
Anion gap: 5 (ref 5–15)
CO2: 28 mmol/L (ref 22–32)
CREATININE: 0.69 mg/dL (ref 0.44–1.00)
Calcium: 9 mg/dL (ref 8.9–10.3)
Chloride: 108 mmol/L (ref 101–111)
GFR calc Af Amer: >60 mL/min (ref >60)
GFR calc non Af Amer: >60 mL/min (ref >60)
GLUCOSE: 119 mg/dL — AB (ref 65–99)
POTASSIUM: 3.5 mmol/L (ref 3.5–5.1)
SODIUM: 141 mmol/L (ref 135–145)
TOTAL PROTEIN: 6.3 g/dL — AB (ref 6.5–8.1)
Total Bilirubin: 0.3 mg/dL (ref 0.3–1.2)

## 2015-03-02 LAB — I-STAT BETA HCG BLOOD, ED (MC, WL, AP ONLY)

## 2015-03-02 LAB — LIPASE, BLOOD: Lipase: 21 U/L — ABNORMAL LOW (ref 22–51)

## 2015-03-02 MED ORDER — KETOROLAC TROMETHAMINE 30 MG/ML IJ SOLN
15.0000 mg | Freq: Once | INTRAMUSCULAR | Status: AC
  Administered 2015-03-02: 15 mg via INTRAVENOUS
  Filled 2015-03-02: qty 1

## 2015-03-02 MED ORDER — IBUPROFEN 600 MG PO TABS: 600.0000 mg | ORAL_TABLET | Freq: Four times a day (QID) | ORAL | Status: DC | PRN

## 2015-03-02 MED ORDER — FENTANYL CITRATE (PF) 100 MCG/2ML IJ SOLN
50.0000 ug | Freq: Once | INTRAMUSCULAR | Status: AC
  Administered 2015-03-02: 50 ug via INTRAVENOUS
  Filled 2015-03-02: qty 2

## 2015-03-02 MED ORDER — FLUCONAZOLE 100 MG PO TABS
150.0000 mg | ORAL_TABLET | Freq: Once | ORAL | Status: AC
  Administered 2015-03-02: 150 mg via ORAL
  Filled 2015-03-02: qty 2

## 2015-03-02 MED ORDER — ONDANSETRON HCL 4 MG/2ML IJ SOLN
4.0000 mg | Freq: Once | INTRAMUSCULAR | Status: AC
  Administered 2015-03-02: 4 mg via INTRAVENOUS
  Filled 2015-03-02: qty 2

## 2015-03-02 MED ORDER — OXYCODONE-ACETAMINOPHEN 5-325 MG PO TABS: 2.0000 | ORAL_TABLET | ORAL | Status: DC | PRN

## 2015-03-02 NOTE — Discharge Instructions (Signed)

## 2015-03-02 NOTE — ED Notes (Addendum)
Pt with acute onset sternal chest pressure that woke her up out of her sleep. Pt tried tums and baking soda with no relief.  Pt has been taking flagyl for bacterial vaginosis.

## 2015-03-02 NOTE — ED Provider Notes (Signed)
CSN: [id number]     Arrival date & time 03/02/15  4098 History   First MD Initiated Contact with Patient 03/02/15 [phone]     Chief Complaint  Patient presents with  . Chest Pain      HPI  Expand All Collapse All   Pt with acute onset sternal chest pressure that woke her up out of her sleep at around 11 PM last night.  Pain is been constant since that time.. Pt tried tums and baking soda with no relief. Pt has been taking flagyl for bacterial vaginosis.        Past Medical History  Diagnosis Date  . Hyperlipidemia   . Hypertension   . Mass     on rt inner thigh   Past Surgical History  Procedure Laterality Date  . Mass excision  05/21/05    inner lt thigh  . Cesarean section  07/11/03  . Breast surgery      left breast papilloma   No family history on file. History  Substance Use Topics  . Smoking status: Never Smoker   . Smokeless tobacco: Not on file  . Alcohol Use: No   OB History    No data available     Review of Systems  All other systems reviewed and are negative  Allergies  Review of patient's allergies indicates no known allergies.  Home Medications   Prior to Admission medications   Medication Sig Start Date End Date Taking? Authorizing Provider  calcium carbonate (TUMS - DOSED IN MG ELEMENTAL CALCIUM) 500 MG chewable tablet Chew 1 tablet by mouth daily as needed for indigestion or heartburn.   Yes Historical Provider, MD  NUVARING 0.12-0.015 MG/24HR vaginal ring Place 1 each vaginally every 28 (twenty-eight) days.  12/27/11  Yes Historical Provider, MD  olmesartan (BENICAR) 40 MG tablet Take 40 mg by mouth daily.     Yes Historical Provider, MD   BP 126/76 mmHg  Pulse 71  Temp(Src) 98.5 F (36.9 C) (Oral)  Resp 20  Ht 5' 4.5" (1.638 m)  Wt 215 lb (97.523 kg)  BMI 36.35 kg/m2  SpO2 100%  LMP 02/17/2015 Physical Exam  Constitutional: She is oriented to person, place, and time. She appears well-developed and well-nourished. No distress.  HENT:    Head: Normocephalic and atraumatic.  Eyes: Pupils are equal, round, and reactive to light.  Neck: Normal range of motion.  Cardiovascular: Normal rate and intact distal pulses.   Pulmonary/Chest: No respiratory distress.    Pain reproducible palpation of the sternum and costochondral margins  Abdominal: Normal appearance. She exhibits no distension.  Musculoskeletal: Normal range of motion.  Neurological: She is alert and oriented to person, place, and time. No cranial nerve deficit.  Skin: Skin is warm and dry. No rash noted.  Psychiatric: She has a normal mood and affect. Her behavior is normal.  Nursing note and vitals reviewed.   ED Course  Procedures (including critical care time) Labs Review Labs Reviewed  CBC WITH DIFFERENTIAL/PLATELET - Abnormal; Notable for the following:    Hemoglobin 11.8 (*)    HCT 35.2 (*)    All other components within normal limits  COMPREHENSIVE METABOLIC PANEL - Abnormal; Notable for the following:    Glucose, Bld 119 (*)    BUN <5 (*)    Total Protein 6.3 (*)    Albumin 3.1 (*)    All other components within normal limits  LIPASE, BLOOD - Abnormal; Notable for the following:    Lipase  21 (*)    All other components within normal limits  CBC  I-STAT TROPOININ, ED    Imaging Review Dg Chest 2 View  03/02/2015   CLINICAL DATA:  Mid chest pain for 1 day  EXAM: CHEST - 2 VIEW  COMPARISON:  02/21/2009  FINDINGS: The heart size and mediastinal contours are within normal limits. Both lungs are clear. The visualized skeletal structures are unremarkable.  IMPRESSION: No active disease.   Electronically Signed   By: Inez Catalina M.D.   On: 03/02/2015 09:22     EKG Interpretation   Date/Time:  Thursday March 02 2015 08:42:21 EDT Ventricular Rate:  70 PR Interval:  165 QRS Duration: 87 QT Interval:  401 QTC Calculation: 433 R Axis:   5 Text Interpretation:  Sinus rhythm Atrial premature complexes Baseline  wander in lead(s) V6 ECG OTHERWISE  WITHIN NORMAL LIMITS Confirmed by  Joshuajames Moehring  MD, Jailene Cupit ([id number]) on 03/02/2015 9:03:13 AM     Symptoms and exam most consistent with costochondritis.  Will treat accordingly.  Patient stable for discharge.  HEART SCORE=1 MDM   Final diagnoses:  Chest pain        Leonard Schwartz, MD 03/02/15 [phone]

## 2015-07-07 NOTE — H&P (Signed)
  Patient name  [name], [name] DICTATION# [id number] CSN# [id number]  [facility], MD 07/07/2015 11:02 AM

## 2015-07-07 NOTE — H&P (Signed)
[name], [name]              ACCOUNT NO.:  [id number]  MEDICAL RECORD NO.:  [id number]  LOCATION:                                 FACILITY:  PHYSICIAN:  Darlyn Chamber, M.D.   DATE OF BIRTH:  [birth year]  DATE OF ADMISSION: DATE OF DISCHARGE:                             HISTORY & PHYSICAL   DATE OF SURGERY:  December 15 at [place]:  The patient is a 39 year old, gravida 1, para 1 female, comes in for myomectomy. The patient has enlarging uterine fibroids.  On an ultrasound, she had multiple intrauterine fibroids and 2 large pedunculated fibroids. Ovaries were unremarkable.  It appears that it become more prolonged and heavier.  She is not really having pain, has been using the NuvaRing for controlling of the abnormal bleeding.  We had discussed various options, including the __________ birth control pills, Depo-Provera, or Mirena IUD.  Because of the pedunculated fibroid, we did not think radiological embolization would be indicated.  The patient did want to proceed, but pretends as conception, therefore now presents for myomectomy.  ALLERGIES:  She is allergic to Vicodin and Percocet.  MEDICATIONS:  At present time are Benicar 40/25 and NuvaRing.  PAST MEDICAL HISTORY:  She has been managed for hypertension, past history of abnormal Pap smears.  PAST SURGICAL HISTORY:  Cesarean section and removal of a papilloma from the breast.  SOCIAL HISTORY:  Reveals no tobacco or alcohol use.  FAMILY HISTORY:  Noncontributory.  REVIEW OF SYSTEMS:  Noncontributory.  PHYSICAL EXAMINATION:  VITAL SIGNS:  The patient is afebrile, stable vital signs. HEENT:  The patient is normocephalic.  Pupils are equal, round, and reactive to light and accommodation.  Oropharynx is clear. NECK: Clear. BREASTS:  Not examined. LUNGS:  Clear. CARDIOVASCULAR SYSTEM:  Regular rhythm and rate.  There are no murmurs or gallops. ABDOMEN:  Benign.  No masses  organomegaly or tenderness.  Does have uterine fibroids above the symphysis. PELVIC:  Normal external genitalia.  Vaginal mucosa is clear.  Cervix unremarkable.  Uterus approximately 12 weeks in size.  Adnexa unremarkable. EXTREMITIES:  Trace edema. NEUROLOGIC:  Grossly within normal limits.  IMPRESSION: 1. Large uterine fibroids with associated menorrhagia. 2. Hypertension.  PLAN:  The patient undergo myomectomy.  The nature of the procedure have been discussed.  The risk had been explained.  This includes the risk of infection.  The risk of hemorrhage that could require transfusion with the risks of AIDS or hepatitis.  There is a risk of injury to adjacent organs such as bowel, bladder, or ureters that could require further exploratory surgery.  Excessive bleeding or complications could require hysterectomy that eliminate future fertility.  There is a risk of deep venous thrombosis and pulmonary embolus.  There is also the risk of developing scars that could lead to infertility as she is also.  The patient does understand the risks, indications, and alternatives.     Darlyn Chamber, M.D.     JSM/MEDQ  D:  07/07/2015  T:  07/07/2015  Job:  [id number]

## 2015-07-12 ENCOUNTER — Encounter (HOSPITAL_COMMUNITY)
Admission: RE | Admit: 2015-07-12 | Discharge: 2015-07-12 | Disposition: A | Discharge status: Home / Self Care | Payer: 59 | Source: Ambulatory Visit | Attending: Obstetrics and Gynecology | Admitting: Obstetrics and Gynecology

## 2015-07-12 ENCOUNTER — Encounter (HOSPITAL_COMMUNITY): Payer: Self-pay

## 2015-07-12 DIAGNOSIS — Z01818 Encounter for other preprocedural examination: Secondary | ICD-10-CM | POA: Insufficient documentation

## 2015-07-12 DIAGNOSIS — D259 Leiomyoma of uterus, unspecified: Secondary | ICD-10-CM | POA: Insufficient documentation

## 2015-07-12 HISTORY — DX: Anemia, unspecified: D64.9

## 2015-07-12 LAB — CBC
HEMATOCRIT: 35 % — AB (ref 36.0–46.0)
HEMOGLOBIN: 11.7 g/dL — AB (ref 12.0–15.0)
MCH: 26.6 pg (ref 26.0–34.0)
MCHC: 33.4 g/dL (ref 30.0–36.0)
MCV: 79.5 fL (ref 78.0–100.0)
Platelets: 325 10*3/uL (ref 150–400)
RBC: 4.4 MIL/uL (ref 3.87–5.11)
RDW: 14.3 % (ref 11.5–15.5)
WBC: 8.9 10*3/uL (ref 4.0–10.5)

## 2015-07-12 LAB — BASIC METABOLIC PANEL
Anion gap: 8 (ref 5–15)
BUN: 17 mg/dL (ref 6–20)
CALCIUM: 9.4 mg/dL (ref 8.9–10.3)
CHLORIDE: 97 mmol/L — AB (ref 101–111)
CO2: 31 mmol/L (ref 22–32)
CREATININE: 0.72 mg/dL (ref 0.44–1.00)
GFR calc non Af Amer: >60 mL/min (ref >60)
Glucose, Bld: 126 mg/dL — ABNORMAL HIGH (ref 65–99)
Potassium: 3.4 mmol/L — ABNORMAL LOW (ref 3.5–5.1)
SODIUM: 136 mmol/L (ref 135–145)

## 2015-07-12 LAB — TYPE AND SCREEN
ABO/RH(D): O POS
ANTIBODY SCREEN: NEGATIVE

## 2015-07-12 LAB — ABO/RH: ABO/RH(D): O POS

## 2015-07-12 NOTE — Patient Instructions (Addendum)
Your procedure is scheduled on:  Thursday, Dec. 15, 2016  Enter through the [employer] of [facility] at:  6:00 A.M.  Pick up the phone at the desk and dial [phone].  Call this number if you have problems the morning of surgery: [phone].  Remember: Do NOT eat food or drink after:  Midnight Wednesday, Dec. 14, 2016 Take these medicines the morning of surgery with a SIP OF WATER:  Edarbyclor  Do NOT wear jewelry (body piercing), metal hair clips/bobby pins, make-up, or nail polish. Do NOT wear lotions, powders, or perfumes.  You may wear deoderant. Do NOT shave for 48 hours prior to surgery. Do NOT bring valuables to the hospital. Contacts, dentures, or bridgework may not be worn into surgery. Leave suitcase in car.  After surgery it may be brought to your room.  For patients admitted to the hospital, checkout time is 11:00 AM the day of discharge.

## 2015-07-14 ENCOUNTER — Other Ambulatory Visit (HOSPITAL_COMMUNITY): Payer: 59

## 2015-07-19 ENCOUNTER — Encounter (HOSPITAL_COMMUNITY): Payer: Self-pay | Admitting: Anesthesiology

## 2015-07-19 NOTE — Anesthesia Preprocedure Evaluation (Addendum)
Anesthesia Evaluation  Patient identified by MRN, date of birth, ID band Patient awake    Reviewed: Allergy & Precautions, NPO status , Patient's Chart, lab work & pertinent test results, reviewed documented beta blocker date and time   Airway Mallampati: III       Dental  (+) Partial Upper, Caps   Pulmonary neg pulmonary ROS,    Pulmonary exam normal breath sounds clear to auscultation       Cardiovascular hypertension, Pt. on medications Normal cardiovascular exam Rhythm:Regular Rate:Normal     Neuro/Psych negative neurological ROS  negative psych ROS   GI/Hepatic negative GI ROS, Neg liver ROS,   Endo/Other  Hyperlipidemia Obesity  Renal/GU negative Renal ROS  negative genitourinary   Musculoskeletal negative musculoskeletal ROS (+)   Abdominal (+) + obese,   Peds  Hematology  (+) anemia ,   Anesthesia Other Findings   Reproductive/Obstetrics Uterine fibroid                            Anesthesia Physical Anesthesia Plan  ASA: II  Anesthesia Plan: General   Post-op Pain Management:    Induction: Intravenous  Airway Management Planned: Oral ETT  Additional Equipment:   Intra-op Plan:   Post-operative Plan: Extubation in OR  Informed Consent: I have reviewed the patients History and Physical, chart, labs and discussed the procedure including the risks, benefits and alternatives for the proposed anesthesia with the patient or authorized representative who has indicated his/her understanding and acceptance.   Dental advisory given  Plan Discussed with: Anesthesiologist, CRNA and Surgeon  Anesthesia Plan Comments:         Anesthesia Quick Evaluation

## 2015-07-20 ENCOUNTER — Encounter (HOSPITAL_COMMUNITY): Payer: Self-pay | Admitting: *Deleted

## 2015-07-20 ENCOUNTER — Inpatient Hospital Stay (HOSPITAL_COMMUNITY): Payer: 59 | Admitting: Anesthesiology

## 2015-07-20 ENCOUNTER — Encounter (HOSPITAL_COMMUNITY)
Admission: RE | Disposition: A | Discharge status: Home / Self Care | Payer: Self-pay | Source: Ambulatory Visit | Attending: Obstetrics and Gynecology

## 2015-07-20 ENCOUNTER — Observation Stay (HOSPITAL_COMMUNITY)
Admission: RE | Admit: 2015-07-20 | Discharge: 2015-07-22 | Disposition: A | Discharge status: Home / Self Care | Payer: 59 | Source: Ambulatory Visit | Attending: Obstetrics and Gynecology | Admitting: Obstetrics and Gynecology

## 2015-07-20 DIAGNOSIS — Z79899 Other long term (current) drug therapy: Secondary | ICD-10-CM | POA: Insufficient documentation

## 2015-07-20 DIAGNOSIS — D259 Leiomyoma of uterus, unspecified: Secondary | ICD-10-CM | POA: Diagnosis not present

## 2015-07-20 DIAGNOSIS — Z885 Allergy status to narcotic agent status: Secondary | ICD-10-CM | POA: Insufficient documentation

## 2015-07-20 DIAGNOSIS — I10 Essential (primary) hypertension: Secondary | ICD-10-CM | POA: Diagnosis not present

## 2015-07-20 DIAGNOSIS — Z9889 Other specified postprocedural states: Secondary | ICD-10-CM

## 2015-07-20 HISTORY — PX: MYOMECTOMY: SHX85

## 2015-07-20 HISTORY — DX: Other specified postprocedural states: Z98.890

## 2015-07-20 LAB — HCG, SERUM, QUALITATIVE: Preg, Serum: NEGATIVE

## 2015-07-20 SURGERY — MYOMECTOMY, ABDOMINAL APPROACH
Anesthesia: General

## 2015-07-20 MED ORDER — GLYCOPYRROLATE 0.2 MG/ML IJ SOLN
INTRAMUSCULAR | Status: AC
  Filled 2015-07-20: qty 3

## 2015-07-20 MED ORDER — CEFAZOLIN SODIUM-DEXTROSE 2-3 GM-% IV SOLR
2.0000 g | INTRAVENOUS | Status: AC
  Administered 2015-07-20: 2 g via INTRAVENOUS

## 2015-07-20 MED ORDER — ROCURONIUM BROMIDE 100 MG/10ML IV SOLN
INTRAVENOUS | Status: AC
  Filled 2015-07-20: qty 1

## 2015-07-20 MED ORDER — MENTHOL 3 MG MT LOZG: 1.0000 | LOZENGE | OROMUCOSAL | Status: DC | PRN

## 2015-07-20 MED ORDER — EPHEDRINE 5 MG/ML INJ
INTRAVENOUS | Status: AC
  Filled 2015-07-20: qty 20

## 2015-07-20 MED ORDER — KETOROLAC TROMETHAMINE 30 MG/ML IJ SOLN
INTRAMUSCULAR | Status: AC
  Filled 2015-07-20: qty 1

## 2015-07-20 MED ORDER — ROCURONIUM BROMIDE 100 MG/10ML IV SOLN
INTRAVENOUS | Status: DC | PRN
  Administered 2015-07-20: 60 mg via INTRAVENOUS

## 2015-07-20 MED ORDER — FENTANYL CITRATE (PF) 100 MCG/2ML IJ SOLN
INTRAMUSCULAR | Status: DC | PRN
  Administered 2015-07-20: 150 ug via INTRAVENOUS
  Administered 2015-07-20: 50 ug via INTRAVENOUS

## 2015-07-20 MED ORDER — VASOPRESSIN 20 UNIT/ML IV SOLN
INTRAVENOUS | Status: AC
  Filled 2015-07-20: qty 1

## 2015-07-20 MED ORDER — LACTATED RINGERS IV SOLN
INTRAVENOUS | Status: DC
  Administered 2015-07-20 – 2015-07-21 (×2): via INTRAVENOUS

## 2015-07-20 MED ORDER — METOCLOPRAMIDE HCL 5 MG/ML IJ SOLN
INTRAMUSCULAR | Status: AC
  Filled 2015-07-20: qty 2

## 2015-07-20 MED ORDER — HYDROMORPHONE HCL 1 MG/ML IJ SOLN
INTRAMUSCULAR | Status: AC
  Filled 2015-07-20: qty 1

## 2015-07-20 MED ORDER — HYDROMORPHONE 1 MG/ML IV SOLN
INTRAVENOUS | Status: DC
  Administered 2015-07-20: 11:00:00 via INTRAVENOUS
  Administered 2015-07-20: 0 mg via INTRAVENOUS
  Administered 2015-07-20: 0.4 mg via INTRAVENOUS
  Administered 2015-07-20: 0.6 mg via INTRAVENOUS
  Administered 2015-07-21: 0.4 mg via INTRAVENOUS
  Filled 2015-07-20: qty 25

## 2015-07-20 MED ORDER — METOCLOPRAMIDE HCL 5 MG/ML IJ SOLN
10.0000 mg | Freq: Once | INTRAMUSCULAR | Status: AC | PRN
  Administered 2015-07-20: 10 mg via INTRAVENOUS

## 2015-07-20 MED ORDER — PHENYLEPHRINE 40 MCG/ML (10ML) SYRINGE FOR IV PUSH (FOR BLOOD PRESSURE SUPPORT)
PREFILLED_SYRINGE | INTRAVENOUS | Status: AC
  Filled 2015-07-20: qty 20

## 2015-07-20 MED ORDER — CEFAZOLIN SODIUM-DEXTROSE 2-3 GM-% IV SOLR
INTRAVENOUS | Status: AC
  Filled 2015-07-20: qty 50

## 2015-07-20 MED ORDER — SODIUM CHLORIDE 0.9 % IJ SOLN
INTRAMUSCULAR | Status: AC
Start: 2015-07-20 — End: 2015-07-20
  Filled 2015-07-20: qty 100

## 2015-07-20 MED ORDER — ONDANSETRON HCL 4 MG/2ML IJ SOLN
INTRAMUSCULAR | Status: AC
  Filled 2015-07-20: qty 2

## 2015-07-20 MED ORDER — ONDANSETRON HCL 4 MG/2ML IJ SOLN: 4.0000 mg | Freq: Four times a day (QID) | INTRAMUSCULAR | Status: DC | PRN

## 2015-07-20 MED ORDER — KETOROLAC TROMETHAMINE 30 MG/ML IJ SOLN
INTRAMUSCULAR | Status: DC | PRN
  Administered 2015-07-20: 30 mg via INTRAVENOUS

## 2015-07-20 MED ORDER — ONDANSETRON HCL 4 MG PO TABS: 4.0000 mg | ORAL_TABLET | Freq: Four times a day (QID) | ORAL | Status: DC | PRN

## 2015-07-20 MED ORDER — HYDROMORPHONE HCL 1 MG/ML IJ SOLN
INTRAMUSCULAR | Status: AC
  Administered 2015-07-20: 0.25 mg via INTRAVENOUS
  Filled 2015-07-20: qty 1

## 2015-07-20 MED ORDER — PROPOFOL 10 MG/ML IV BOLUS
INTRAVENOUS | Status: AC
  Filled 2015-07-20: qty 20

## 2015-07-20 MED ORDER — LIDOCAINE HCL (CARDIAC) 20 MG/ML IV SOLN
INTRAVENOUS | Status: AC
  Filled 2015-07-20: qty 5

## 2015-07-20 MED ORDER — ACETAMINOPHEN 325 MG PO TABS: 650.0000 mg | ORAL_TABLET | ORAL | Status: DC | PRN

## 2015-07-20 MED ORDER — LACTATED RINGERS IV SOLN
INTRAVENOUS | Status: DC
  Administered 2015-07-20 (×3): via INTRAVENOUS

## 2015-07-20 MED ORDER — MIDAZOLAM HCL 5 MG/5ML IJ SOLN
INTRAMUSCULAR | Status: DC | PRN
  Administered 2015-07-20: 2 mg via INTRAVENOUS

## 2015-07-20 MED ORDER — GLYCOPYRROLATE 0.2 MG/ML IJ SOLN
INTRAMUSCULAR | Status: DC | PRN
  Administered 2015-07-20: .8 mg via INTRAVENOUS

## 2015-07-20 MED ORDER — SCOPOLAMINE 1 MG/3DAYS TD PT72
MEDICATED_PATCH | TRANSDERMAL | Status: AC
  Administered 2015-07-20: 1.5 mg via TRANSDERMAL
  Filled 2015-07-20: qty 1

## 2015-07-20 MED ORDER — HYDROMORPHONE HCL 1 MG/ML IJ SOLN
0.2500 mg | INTRAMUSCULAR | Status: DC | PRN
  Administered 2015-07-20 (×3): 0.5 mg via INTRAVENOUS
  Administered 2015-07-20 (×2): 0.25 mg via INTRAVENOUS

## 2015-07-20 MED ORDER — SCOPOLAMINE 1 MG/3DAYS TD PT72
1.0000 | MEDICATED_PATCH | Freq: Once | TRANSDERMAL | Status: DC
  Administered 2015-07-20: 1.5 mg via TRANSDERMAL

## 2015-07-20 MED ORDER — HYDROMORPHONE HCL 1 MG/ML IJ SOLN
INTRAMUSCULAR | Status: DC | PRN
  Administered 2015-07-20: 1 mg via INTRAVENOUS

## 2015-07-20 MED ORDER — DEXAMETHASONE SODIUM PHOSPHATE 4 MG/ML IJ SOLN
INTRAMUSCULAR | Status: AC
  Filled 2015-07-20: qty 1

## 2015-07-20 MED ORDER — NEOSTIGMINE METHYLSULFATE 10 MG/10ML IV SOLN
INTRAVENOUS | Status: AC
  Filled 2015-07-20: qty 1

## 2015-07-20 MED ORDER — SODIUM CHLORIDE 0.9 % IJ SOLN
INTRAMUSCULAR | Status: DC | PRN
  Administered 2015-07-20: 50 mL via INTRAVENOUS

## 2015-07-20 MED ORDER — ONDANSETRON HCL 4 MG/2ML IJ SOLN
INTRAMUSCULAR | Status: DC | PRN
  Administered 2015-07-20: 4 mg via INTRAVENOUS

## 2015-07-20 MED ORDER — LIDOCAINE HCL (CARDIAC) 20 MG/ML IV SOLN
INTRAVENOUS | Status: DC | PRN
  Administered 2015-07-20: 100 mg via INTRAVENOUS

## 2015-07-20 MED ORDER — SODIUM CHLORIDE 0.9 % IJ SOLN: 9.0000 mL | INTRAMUSCULAR | Status: DC | PRN

## 2015-07-20 MED ORDER — MIDAZOLAM HCL 2 MG/2ML IJ SOLN
INTRAMUSCULAR | Status: AC
  Filled 2015-07-20: qty 2

## 2015-07-20 MED ORDER — DIPHENHYDRAMINE HCL 12.5 MG/5ML PO ELIX: 12.5000 mg | ORAL_SOLUTION | Freq: Four times a day (QID) | ORAL | Status: DC | PRN

## 2015-07-20 MED ORDER — MEPERIDINE HCL 25 MG/ML IJ SOLN: 6.2500 mg | INTRAMUSCULAR | Status: DC | PRN

## 2015-07-20 MED ORDER — PROPOFOL 10 MG/ML IV BOLUS
INTRAVENOUS | Status: DC | PRN
  Administered 2015-07-20: 200 mg via INTRAVENOUS

## 2015-07-20 MED ORDER — OXYCODONE-ACETAMINOPHEN 5-325 MG PO TABS
1.0000 | ORAL_TABLET | ORAL | Status: DC | PRN
  Administered 2015-07-21 (×3): 1 via ORAL
  Filled 2015-07-20 (×4): qty 1

## 2015-07-20 MED ORDER — DEXAMETHASONE SODIUM PHOSPHATE 4 MG/ML IJ SOLN
INTRAMUSCULAR | Status: DC | PRN
  Administered 2015-07-20: 4 mg via INTRAVENOUS

## 2015-07-20 MED ORDER — FENTANYL CITRATE (PF) 250 MCG/5ML IJ SOLN
INTRAMUSCULAR | Status: AC
  Filled 2015-07-20: qty 5

## 2015-07-20 MED ORDER — SODIUM CHLORIDE 0.9 % IJ SOLN
INTRAMUSCULAR | Status: AC
  Filled 2015-07-20: qty 100

## 2015-07-20 MED ORDER — VASOPRESSIN 20 UNIT/ML IV SOLN
INTRAVENOUS | Status: DC | PRN
  Administered 2015-07-20: 5 mL via INTRAMUSCULAR
  Administered 2015-07-20: 4 mL via INTRAMUSCULAR
  Administered 2015-07-20: 6 mL via INTRAMUSCULAR
  Administered 2015-07-20 (×2): 4 mL via INTRAMUSCULAR

## 2015-07-20 MED ORDER — HYDROMORPHONE HCL 1 MG/ML IJ SOLN
INTRAMUSCULAR | Status: AC
  Administered 2015-07-20: 0.5 mg via INTRAVENOUS
  Filled 2015-07-20: qty 1

## 2015-07-20 MED ORDER — NALOXONE HCL 0.4 MG/ML IJ SOLN: 0.4000 mg | INTRAMUSCULAR | Status: DC | PRN

## 2015-07-20 MED ORDER — DIPHENHYDRAMINE HCL 50 MG/ML IJ SOLN: 12.5000 mg | Freq: Four times a day (QID) | INTRAMUSCULAR | Status: DC | PRN

## 2015-07-20 MED ORDER — NEOSTIGMINE METHYLSULFATE 10 MG/10ML IV SOLN
INTRAVENOUS | Status: DC | PRN
  Administered 2015-07-20: 5 mg via INTRAVENOUS

## 2015-07-20 MED ORDER — BUPIVACAINE LIPOSOME 1.3 % IJ SUSP
20.0000 mL | Freq: Once | INTRAMUSCULAR | Status: AC
  Administered 2015-07-20: 20 mL
  Filled 2015-07-20: qty 20

## 2015-07-20 SURGICAL SUPPLY — 36 items
BARRIER ADHS 3X4 INTERCEED (GAUZE/BANDAGES/DRESSINGS) ×4 IMPLANT
BENZOIN TINCTURE PRP APPL 2/3 (GAUZE/BANDAGES/DRESSINGS) ×2 IMPLANT
CANISTER SUCT 3000ML (MISCELLANEOUS) ×2 IMPLANT
CLOTH BEACON ORANGE TIMEOUT ST (SAFETY) ×2 IMPLANT
CONT PATH 16OZ SNAP LID 3702 (MISCELLANEOUS) ×2 IMPLANT
DECANTER SPIKE VIAL GLASS SM (MISCELLANEOUS) ×8 IMPLANT
DRAPE CESAREAN BIRTH W POUCH (DRAPES) ×2 IMPLANT
DRAPE WARM FLUID 44X44 (DRAPE) ×2 IMPLANT
DRSG OPSITE POSTOP 4X10 (GAUZE/BANDAGES/DRESSINGS) ×2 IMPLANT
GAUZE SPONGE 4X4 16PLY XRAY LF (GAUZE/BANDAGES/DRESSINGS) ×2 IMPLANT
GLOVE BIO SURGEON STRL SZ7 (GLOVE) ×2 IMPLANT
GLOVE BIOGEL PI IND STRL 7.0 (GLOVE) ×1 IMPLANT
GLOVE BIOGEL PI INDICATOR 7.0 (GLOVE) ×1
GOWN STRL REUS W/TWL LRG LVL3 (GOWN DISPOSABLE) ×4 IMPLANT
NEEDLE HYPO 22GX1.5 SAFETY (NEEDLE) ×4 IMPLANT
NS IRRIG 1000ML POUR BTL (IV SOLUTION) ×4 IMPLANT
PACK ABDOMINAL GYN (CUSTOM PROCEDURE TRAY) ×2 IMPLANT
PAD ABD 8X7 1/2 STERILE (GAUZE/BANDAGES/DRESSINGS) ×2 IMPLANT
PAD OB MATERNITY 4.3X12.25 (PERSONAL CARE ITEMS) ×2 IMPLANT
PENCIL SMOKE EVAC W/HOLSTER (ELECTROSURGICAL) ×2 IMPLANT
RINGERS IRRIG 1000ML POUR BTL (IV SOLUTION) IMPLANT
SPONGE LAP 18X18 X RAY DECT (DISPOSABLE) ×2 IMPLANT
STAPLER VISISTAT 35W (STAPLE) ×2 IMPLANT
STRIP CLOSURE SKIN 1/2X4 (GAUZE/BANDAGES/DRESSINGS) ×2 IMPLANT
SUT CHROMIC 0 CTX 36 (SUTURE) ×8 IMPLANT
SUT MNCRL AB 3-0 PS2 27 (SUTURE) ×2 IMPLANT
SUT MNCRL AB 4-0 PS2 18 (SUTURE) ×2 IMPLANT
SUT PDS AB 0 CT 36 (SUTURE) ×4 IMPLANT
SUT PDS AB 2-0 CT1 27 (SUTURE) ×10 IMPLANT
SUT PLAIN 2 0 XLH (SUTURE) ×2 IMPLANT
SUT VIC AB 2-0 CT1 27 (SUTURE) ×1
SUT VIC AB 2-0 CT1 TAPERPNT 27 (SUTURE) ×1 IMPLANT
SYR CONTROL 10ML LL (SYRINGE) ×4 IMPLANT
TOWEL OR 17X24 6PK STRL BLUE (TOWEL DISPOSABLE) ×4 IMPLANT
TRAY FOLEY CATH SILVER 14FR (SET/KITS/TRAYS/PACK) ×2 IMPLANT
WATER STERILE IRR 1000ML POUR (IV SOLUTION) IMPLANT

## 2015-07-20 NOTE — Anesthesia Procedure Notes (Signed)
Procedure Name: Intubation Date/Time: 07/20/2015 7:24 AM Performed by: Talbot Grumbling Pre-anesthesia Checklist: Patient identified, Emergency Drugs available, Suction available and Patient being monitored Patient Re-evaluated:Patient Re-evaluated prior to inductionOxygen Delivery Method: Circle system utilized Preoxygenation: Pre-oxygenation with 100% oxygen Intubation Type: IV induction Ventilation: Mask ventilation without difficulty Laryngoscope Size: Miller and 2 Grade View: Grade I Tube type: Oral Tube size: 7.0 mm Number of attempts: 1 Airway Equipment and Method: Stylet Placement Confirmation: ETT inserted through vocal cords under direct vision,  positive ETCO2 and breath sounds checked- equal and bilateral Secured at: 21 cm Tube secured with: Tape Dental Injury: Teeth and Oropharynx as per pre-operative assessment

## 2015-07-20 NOTE — Anesthesia Postprocedure Evaluation (Signed)
Anesthesia Post Note  Patient: [name]  Procedure(s) Performed: Procedure(s) (LRB): MYOMECTOMY, ABDOMINAL (N/A)  Patient location during evaluation: [facility] Anesthesia Type: General Level of consciousness: awake Pain management: pain level controlled Vital Signs Assessment: post-procedure vital signs reviewed and stable Respiratory status: spontaneous breathing Cardiovascular status: stable Postop Assessment: no signs of nausea or vomiting and adequate PO intake Anesthetic complications: no    Last Vitals:  Filed Vitals:   07/20/15 1250 07/20/15 1350  BP: 111/60 108/52  Pulse: 91 77  Temp: 36.5 C 36.4 C  Resp: 21 21    Last Pain:  Filed Vitals:   07/20/15 1410  PainSc: 4                  Darlisa Spruiell

## 2015-07-20 NOTE — Op Note (Signed)
[name], [name]              ACCOUNT NO.:  [id number]  MEDICAL RECORD NO.:  [id number]  LOCATION:  [id number]                          FACILITY:  [facility]  PHYSICIAN:  Darlyn Chamber, M.D.   DATE OF BIRTH:  1976-01-28  DATE OF PROCEDURE:  07/20/2015 DATE OF DISCHARGE:                              OPERATIVE REPORT   PREOPERATIVE DIAGNOSIS:  Uterine fibroids.  POSTOPERATIVE DIAGNOSIS:  Uterine fibroids.  OPERATIVE PROCEDURE:  Multiple myomectomies.  SURGEON:  Darlyn Chamber, MD  ASSISTANT:  Ralene Bathe. Matthew Saras, MD  ANESTHESIA:  General endotracheal.  ESTIMATED BLOOD LOSS:  400 mL.  PACKS:  None.  DRAINS:  Included urethral Foley.  INTRAOPERATIVE BLOOD PLACED:  None.  COMPLICATIONS:  None.  INDICATION:  Dictated in history and physical.  PROCEDURE IN DETAIL:  The patient was taken to the OR, placed in supine position.  After satisfactory level of general endotracheal anesthesia obtained, the abdomen was prepped out with Betadine and draped as sterile field.  A prior low transverse skin incision was identified and excised.  The incision was extended through the subcutaneous tissue. Anterior rectus fascia was entered sharply.  Incision was fashioned laterally.  The fascia was taken off the muscle superiorly inferiorly. Rectus muscles were separated in the midline.  Perineum was entered sharply.  Incision of perineum extended both superiorly and inferiorly. We were able to deliver the uterus through the incision.  She had multiple pedunculated fibroids and multiple intramural fibroids.  She had some adhesions to 1 fibroid.  There was an omental adhesion also appendix, but appendix was really not involved.  We were able to take this down bluntly.  The appendix was not damaged.  Both ovaries appeared to be normal.  We started with some of the larger fibroids in front.  We injected with Pitressin diluted in saline.  We made an incision in the uterus over the fibroids, multiple  fibroids removed.  Deep tissue was closed with figure-of-eight of 0 Vicryl.  The peritoneum was closed with running suture of 2-0 PDS.  We then went to several fibroids in the back.  The area was injected with Pitressin and saline.  Incision was made.  Two large fibroids removed through this incision.  Deep tissue was closed with figure-of-eights of 0 Vicryl.  The serosa was closed in running suture of 2-0 PDS.  She had several large pedunculated fibroids anteriorly and cluster of smaller ones.  We removed on the right side, 2 large pedunculated fibroids.  We then went deeper and found more fibroids deeper, these were removed through the same incision.  Total of 2 more were removed.  Deep myometrium closed with figure-of-eights of 0 chromic and the serosa closed with a running suture of 2-0 PDS.  Then on the left side, one large pedunculated, several small pedunculated fibroids were removed.  We were able to close that incision with a running serosal suture of 2-0 PDS.  At this point in time, all fibroids had been adequately removed.  We had good hemostasis.  We irrigated the pelvis.  No active bleeding was noted.  Urine output was clear and adequate.  EnSeal was placed over the uterus and returned to  the abdominal cavity.  We closed the peritoneum with a running suture of 2-0 Vicryl.  Fascia closed with running suture of 0 PDS.  We injected Exparel into the fascia and into the subcutaneous tissue.  Subcutaneous tissue was then closed with a running suture of 0 plain catgut.  Skin was closed in running subcuticular of 3-0 Monocryl and Steri-Strips. Sponge, instrument, needle count was correct by circulating nurse x2. Foley catheter remained clear at the time of closure.  Patient tolerated procedure well, was returned to recovery room in good condition.     Darlyn Chamber, M.D.     JSM/MEDQ  D:  07/20/2015  T:  07/20/2015  Job:  [id number]

## 2015-07-20 NOTE — Addendum Note (Signed)
Addendum  created 07/20/15 1530 by Ignacia Bayley, CRNA   Modules edited: Clinical Notes   Clinical Notes:  File: [id number]

## 2015-07-20 NOTE — Anesthesia Postprocedure Evaluation (Signed)
Anesthesia Post Note  Patient: [name]  Procedure(s) Performed: Procedure(s) (LRB): MYOMECTOMY, ABDOMINAL (N/A)  Patient location during evaluation: PACU Anesthesia Type: General Level of consciousness: awake and alert and oriented Pain management: pain level controlled Vital Signs Assessment: post-procedure vital signs reviewed and stable Respiratory status: spontaneous breathing, nonlabored ventilation, respiratory function stable and patient connected to nasal cannula oxygen Cardiovascular status: blood pressure returned to baseline and stable Postop Assessment: no signs of nausea or vomiting Anesthetic complications: no    Last Vitals:  Filed Vitals:   07/20/15 0915 07/20/15 0930  BP: 120/75 117/75  Pulse: 78 79  Temp:    Resp: 20 20    Last Pain:  Filed Vitals:   07/20/15 0942  PainSc: 8                  Jenisha Faison A.

## 2015-07-20 NOTE — Transfer of Care (Signed)
Immediate Anesthesia Transfer of Care Note  Patient: [name]  Procedure(s) Performed: Procedure(s): MYOMECTOMY, ABDOMINAL (N/A)  Patient Location: PACU  Anesthesia Type:General  Level of Consciousness: sedated  Airway & Oxygen Therapy: Patient Spontanous Breathing and Patient connected to nasal cannula oxygen  Post-op Assessment: Report given to RN  Post vital signs: Reviewed  Last Vitals:  Filed Vitals:   07/20/15 0603  BP: 124/70  Pulse: 85  Temp: 37.1 C  Resp: 18    Complications: No apparent anesthesia complications

## 2015-07-20 NOTE — Progress Notes (Signed)
Patient ID: [name], female   DOB: 15-Aug-1975, 39 y.o.   MRN: [id number] AF VSS INC DRY GOOD UO MINIMAL SPOTTING

## 2015-07-20 NOTE — Brief Op Note (Signed)
07/20/2015  8:39 AM  PATIENT:  [name]  39 y.o. female  PRE-OPERATIVE DIAGNOSIS:  FIBROIDS  POST-OPERATIVE DIAGNOSIS:  FIBROIDS  PROCEDURE:  Procedure(s): MYOMECTOMY, ABDOMINAL (N/A)  SURGEON:  Surgeon(s) and Role:    * Arvella Nigh, MD - Primary    * Molli Posey, MD - Assisting  PHYSICIAN ASSISTANT:   ASSISTANTS: holland   ANESTHESIA:   local and general  EBL:  Total I/O In: 1700 [I.V.:1700] Out: 300 [Urine:100; Blood:200]  BLOOD ADMINISTERED:none  DRAINS: Urinary Catheter (Foley)   LOCAL MEDICATIONS USED:  OTHER exprell  SPECIMEN:uterine fibroids  DISPOSITION OF SPECIMEN:  PATHOLOGY  COUNTS:  YES  TOURNIQUET:  * No tourniquets in log *  DICTATION: .Other Dictation: Dictation Number [id number]  PLAN OF CARE: Admit for overnight observation  PATIENT DISPOSITION:  PACU - hemodynamically stable.   Delay start of Pharmacological VTE agent (>24hrs) due to surgical blood loss or risk of bleeding: no

## 2015-07-20 NOTE — H&P (Signed)
  History and physical exam unchanged 

## 2015-07-21 ENCOUNTER — Encounter (HOSPITAL_COMMUNITY): Payer: Self-pay | Admitting: Obstetrics and Gynecology

## 2015-07-21 DIAGNOSIS — D259 Leiomyoma of uterus, unspecified: Secondary | ICD-10-CM | POA: Diagnosis not present

## 2015-07-21 LAB — CBC
HCT: 25.5 % — ABNORMAL LOW (ref 36.0–46.0)
Hemoglobin: 8.3 g/dL — ABNORMAL LOW (ref 12.0–15.0)
MCH: 26.2 pg (ref 26.0–34.0)
MCHC: 32.5 g/dL (ref 30.0–36.0)
MCV: 80.4 fL (ref 78.0–100.0)
PLATELETS: 287 10*3/uL (ref 150–400)
RBC: 3.17 MIL/uL — AB (ref 3.87–5.11)
RDW: 14.4 % (ref 11.5–15.5)
WBC: 12.1 10*3/uL — ABNORMAL HIGH (ref 4.0–10.5)

## 2015-07-21 MED ORDER — IRBESARTAN 300 MG PO TABS
300.0000 mg | ORAL_TABLET | Freq: Every day | ORAL | Status: DC
  Filled 2015-07-21: qty 1

## 2015-07-21 MED ORDER — IBUPROFEN 800 MG PO TABS
800.0000 mg | ORAL_TABLET | Freq: Three times a day (TID) | ORAL | Status: DC | PRN
  Administered 2015-07-21 – 2015-07-22 (×2): 800 mg via ORAL
  Filled 2015-07-21 (×2): qty 1

## 2015-07-21 MED ORDER — HYDROCHLOROTHIAZIDE 25 MG PO TABS: 25.0000 mg | ORAL_TABLET | Freq: Every day | ORAL | Status: DC

## 2015-07-21 NOTE — Progress Notes (Signed)
1 Day Post-Op Procedure(s) (LRB): MYOMECTOMY, ABDOMINAL (N/A)  Subjective: Patient reports tolerating PO.    Objective: I have reviewed patient's vital signs and labs.  General: alert GI: soft, non-tender; bowel sounds normal; no masses,  no organomegaly and incision: dry Vaginal Bleeding: minimal  Assessment: s/p Procedure(s): MYOMECTOMY, ABDOMINAL (N/A): stable  Plan: Advance diet  LOS: 1 day    Miryah Ralls S 07/21/2015, 8:20 AM

## 2015-07-22 DIAGNOSIS — D259 Leiomyoma of uterus, unspecified: Secondary | ICD-10-CM | POA: Diagnosis not present

## 2015-07-22 LAB — CBC
HCT: 23.2 % — ABNORMAL LOW (ref 36.0–46.0)
Hemoglobin: 7.7 g/dL — ABNORMAL LOW (ref 12.0–15.0)
MCH: 27 pg (ref 26.0–34.0)
MCHC: 33.2 g/dL (ref 30.0–36.0)
MCV: 81.4 fL (ref 78.0–100.0)
PLATELETS: 258 10*3/uL (ref 150–400)
RBC: 2.85 MIL/uL — AB (ref 3.87–5.11)
RDW: 14.5 % (ref 11.5–15.5)
WBC: 8.3 10*3/uL (ref 4.0–10.5)

## 2015-07-22 MED ORDER — IBUPROFEN 800 MG PO TABS: 800.0000 mg | ORAL_TABLET | Freq: Three times a day (TID) | ORAL | Status: DC | PRN

## 2015-07-22 MED ORDER — OXYCODONE-ACETAMINOPHEN 7.5-325 MG PO TABS: 1.0000 | ORAL_TABLET | ORAL | Status: DC | PRN

## 2015-07-22 NOTE — Progress Notes (Signed)
D/c instructions and prescriptions reviewed with patient. Pt has a f/u on 12/20 with MD. D/c stable with family via wc to private car.

## 2015-07-22 NOTE — Discharge Summary (Signed)
NAMEMIRIAM, [name]              ACCOUNT NO.:  [id number]  MEDICAL RECORD NO.:  [id number]  LOCATION:  [id number]                          [facility]  PHYSICIAN:  Darlyn Chamber, M.D.   DATE OF BIRTH:  08-29-1975  DATE OF ADMISSION:  07/20/2015 DATE OF DISCHARGE:  07/22/2015                              DISCHARGE SUMMARY   ADMITTING DIAGNOSIS:  Uterine fibroids.  DISCHARGE DIAGNOSIS:  Uterine fibroids.  OPERATIVE PROCEDURE:  Myomectomy.  For complete history and physical, please see dictated note.  COURSE IN THE HOSPITAL:  The patient underwent above-noted surgery. Postop bleed, she did extremely well.  Her postop hemoglobin was initially 8.3, the following day 7.7.  She was very stable with this. She was at that point, tolerating regular diet, voiding without difficulty, ambulating without difficulty.  On exam, she was afebrile, stable vital signs.  All incisions were clear.  She was having minimal vaginal spotting.  In terms of complications, none were encountered during her stay in the hospital.  The patient is discharged home in stable condition.  DISPOSITION:  The patient is to avoid heavy lifting or driving of car. She was instructed of signs and symptoms to watch for this could include fever, which would be a sign of infection.  She should call for nausea, vomiting, or excessive pain.  Instructed of signs and symptoms of deep venous thrombosis and pulmonary embolus.  The patient expressed understanding of potential risks and complications.     Darlyn Chamber, M.D.     JSM/MEDQ  D:  07/22/2015  T:  07/22/2015  Job:  [id number]

## 2015-07-22 NOTE — Progress Notes (Signed)
2 Days Post-Op Procedure(s) (LRB): MYOMECTOMY, ABDOMINAL (N/A)  Subjective: Patient reports tolerating PO and no problems voiding.    Objective: I have reviewed patient's vital signs.  General: alert GI: soft, non-tender; bowel sounds normal; no masses,  no organomegaly  Assessment: s/p Procedure(s): MYOMECTOMY, ABDOMINAL (N/A): stable  Plan: Discharge home  LOS: 2 days    [name] S 07/22/2015, 7:54 AM

## 2015-07-22 NOTE — Discharge Summary (Signed)
  Patient name [name], [name] DICTATION# [id number] CSN# [id number]  [facility], MD 07/22/2015 7:55 AM

## 2017-04-16 ENCOUNTER — Other Ambulatory Visit: Payer: Self-pay | Admitting: Obstetrics and Gynecology

## 2017-04-16 DIAGNOSIS — N6310 Unspecified lump in the right breast, unspecified quadrant: Secondary | ICD-10-CM

## 2017-04-18 ENCOUNTER — Ambulatory Visit
Admission: RE | Admit: 2017-04-18 | Discharge: 2017-04-18 | Disposition: A | Discharge status: Home / Self Care | Payer: 59 | Source: Ambulatory Visit | Attending: Obstetrics and Gynecology | Admitting: Obstetrics and Gynecology

## 2017-04-18 ENCOUNTER — Other Ambulatory Visit: Payer: 59

## 2017-04-18 ENCOUNTER — Inpatient Hospital Stay
Admission: RE | Admit: 2017-04-18 | Discharge: 2017-04-18 | Disposition: A | Discharge status: Home / Self Care | Payer: 59 | Source: Ambulatory Visit | Attending: Obstetrics and Gynecology | Admitting: Obstetrics and Gynecology

## 2017-04-18 DIAGNOSIS — N6310 Unspecified lump in the right breast, unspecified quadrant: Secondary | ICD-10-CM

## 2017-08-13 ENCOUNTER — Other Ambulatory Visit: Payer: Self-pay | Admitting: Nurse Practitioner

## 2017-08-13 DIAGNOSIS — R42 Dizziness and giddiness: Secondary | ICD-10-CM

## 2018-03-24 ENCOUNTER — Other Ambulatory Visit: Payer: Self-pay | Admitting: Obstetrics and Gynecology

## 2018-03-24 DIAGNOSIS — Z1231 Encounter for screening mammogram for malignant neoplasm of breast: Secondary | ICD-10-CM

## 2018-04-21 ENCOUNTER — Ambulatory Visit: Payer: 59

## 2018-05-06 ENCOUNTER — Other Ambulatory Visit: Payer: Self-pay | Admitting: Nurse Practitioner

## 2018-05-14 ENCOUNTER — Emergency Department (HOSPITAL_COMMUNITY)
Admission: EM | Admit: 2018-05-14 | Discharge: 2018-05-14 | Disposition: A | Discharge status: Home / Self Care | Payer: 59 | Attending: Emergency Medicine | Admitting: Emergency Medicine

## 2018-05-14 ENCOUNTER — Encounter (HOSPITAL_COMMUNITY): Payer: Self-pay | Admitting: Emergency Medicine

## 2018-05-14 DIAGNOSIS — E860 Dehydration: Secondary | ICD-10-CM | POA: Diagnosis not present

## 2018-05-14 DIAGNOSIS — I10 Essential (primary) hypertension: Secondary | ICD-10-CM | POA: Diagnosis not present

## 2018-05-14 DIAGNOSIS — Z79899 Other long term (current) drug therapy: Secondary | ICD-10-CM | POA: Diagnosis not present

## 2018-05-14 DIAGNOSIS — R42 Dizziness and giddiness: Secondary | ICD-10-CM | POA: Diagnosis not present

## 2018-05-14 LAB — I-STAT BETA HCG BLOOD, ED (MC, WL, AP ONLY)

## 2018-05-14 LAB — URINALYSIS, ROUTINE W REFLEX MICROSCOPIC
Bilirubin Urine: NEGATIVE
Glucose, UA: NEGATIVE mg/dL
Ketones, ur: NEGATIVE mg/dL
Leukocytes, UA: NEGATIVE
NITRITE: NEGATIVE
PROTEIN: NEGATIVE mg/dL
SPECIFIC GRAVITY, URINE: 1.025 (ref 1.005–1.030)
pH: 7 (ref 5.0–8.0)

## 2018-05-14 LAB — BASIC METABOLIC PANEL
Anion gap: 6 (ref 5–15)
BUN: 12 mg/dL (ref 6–20)
CO2: 30 mmol/L (ref 22–32)
CREATININE: 0.76 mg/dL (ref 0.44–1.00)
Calcium: 9.2 mg/dL (ref 8.9–10.3)
Chloride: 105 mmol/L (ref 98–111)
GFR calc Af Amer: >60 mL/min (ref >60)
GLUCOSE: 94 mg/dL (ref 70–99)
Potassium: 3.5 mmol/L (ref 3.5–5.1)
SODIUM: 141 mmol/L (ref 135–145)

## 2018-05-14 LAB — CBC
HEMATOCRIT: 34.9 % — AB (ref 36.0–46.0)
Hemoglobin: 11 g/dL — ABNORMAL LOW (ref 12.0–15.0)
MCH: 25.7 pg — AB (ref 26.0–34.0)
MCHC: 31.5 g/dL (ref 30.0–36.0)
MCV: 81.5 fL (ref 80.0–100.0)
Platelets: 298 10*3/uL (ref 150–400)
RBC: 4.28 MIL/uL (ref 3.87–5.11)
RDW: 14.3 % (ref 11.5–15.5)
WBC: 5.8 10*3/uL (ref 4.0–10.5)
nRBC: 0 % (ref 0.0–0.2)

## 2018-05-14 MED ORDER — SODIUM CHLORIDE 0.9 % IV BOLUS: 1000.0000 mL | Freq: Once | INTRAVENOUS | Status: DC

## 2018-05-14 MED ORDER — SODIUM CHLORIDE 0.9 % IV BOLUS
1000.0000 mL | Freq: Once | INTRAVENOUS | Status: AC
  Administered 2018-05-14: 1000 mL via INTRAVENOUS

## 2018-05-14 NOTE — ED Triage Notes (Signed)
Pt reports that her menstrual cycle has been very heavy and had lots of bleeding.

## 2018-05-14 NOTE — ED Notes (Signed)
Patient given discharge teaching and verbalized understanding. Patient ambulated out of ED with a steady gait. 

## 2018-05-14 NOTE — ED Notes (Signed)
ED Provider at bedside. 

## 2018-05-14 NOTE — ED Triage Notes (Signed)
Pt reports since her menstrual cycle started Saturday night she has been dizzy, fatigued, nauseated at work. Reports at mall last night felt SOB and heart racing when walked from one end to the other.

## 2018-05-14 NOTE — Discharge Instructions (Addendum)
Return here as needed.  Increase your fluid intake.  Rest as much as possible.  Follow-up with your primary doctor soon as possible.

## 2018-05-14 NOTE — ED Triage Notes (Signed)
Pt reports that since December last year had ear feeling of being clogged.

## 2018-05-14 NOTE — ED Provider Notes (Signed)
[facility] DEPT Provider Note   CSN: [id number] Arrival date & time: 05/14/18  1032     History   Chief Complaint Chief Complaint  Patient presents with  . Dizziness  . Fatigue    HPI [name] is a 42 y.o. female.  HPI Patient presents to the emergency department with generalized weakness and lightheadedness is been ongoing for quite a while but seems to get worse over the last few months.  The patient states that she was seen by her doctor recently who referred her to ENT because of chronic left ear fullness.  The patient states that she has had vertigo type symptoms intermittently since 2018.  Patient states that she is not taking any medication for this.  The patient denies chest pain, shortness of breath, headache,blurred vision, neck pain, fever, cough, numbness,  anorexia, edema, abdominal pain, nausea, vomiting, diarrhea, rash, back pain, dysuria, hematemesis, bloody stool, near syncope, or syncope. Past Medical History:  Diagnosis Date  . Anemia   . Hyperlipidemia    patient denies  . Hypertension   . Mass    on rt inner thigh    Patient Active Problem List   Diagnosis Date Noted  . S/P myomectomy 07/20/2015    Class: Status post    Past Surgical History:  Procedure Laterality Date  . BREAST EXCISIONAL BIOPSY Left 2003   papiloma removed  . BREAST SURGERY     left breast papilloma  . CESAREAN SECTION  07/11/03  . MASS EXCISION  05/21/05   inner lt thigh  . MYOMECTOMY N/A 07/20/2015   Procedure: MYOMECTOMY, ABDOMINAL;  Surgeon: Arvella Nigh, MD;  Location: [facility];  Service: Gynecology;  Laterality: N/A;  . WISDOM TOOTH EXTRACTION       OB History   None      Home Medications    Prior to Admission medications   Medication Sig Start Date End Date Taking? Authorizing Provider  [place] 0.12-0.015 MG/24HR vaginal ring Place 1 application vaginally every 30 (thirty) days. 03/23/18  Yes [provider]    olmesartan-hydrochlorothiazide (BENICAR HCT) 40-25 MG tablet Take 1 tablet by mouth daily. 03/31/18  Yes [provider]  phentermine (ADIPEX-P) 37.5 MG tablet Take 37.5 mg by mouth daily. 03/26/18  Yes [provider]  oxyCODONE-acetaminophen (PERCOCET/ROXICET) 5-325 MG per tablet Take 2 tablets by mouth every 4 (four) hours as needed for severe pain. Patient not taking: Reported on 07/07/2015 03/02/15   Leonard Schwartz, MD    Family History No family history on file.  Social History Social History   Tobacco Use  . Smoking status: Never Smoker  . Smokeless tobacco: Never Used  Substance Use Topics  . Alcohol use: No  . Drug use: No     Allergies   Hydrocodone-acetaminophen   Review of Systems Review of Systems All other systems negative except as documented in the HPI. All pertinent positives and negatives as reviewed in the HPI.  Physical Exam Updated Vital Signs BP 137/84   Pulse 92   Temp 97.9 F (36.6 C) (Oral)   Resp 18   LMP 05/09/2018   SpO2 100%   Physical Exam  Constitutional: She is oriented to person, place, and time. She appears well-developed and well-nourished. No distress.  HENT:  Head: Normocephalic and atraumatic.  Mouth/Throat: Oropharynx is clear and moist.  Eyes: Pupils are equal, round, and reactive to light.  Neck: Normal range of motion. Neck supple.  Cardiovascular: Normal rate, regular rhythm and  normal heart sounds. Exam reveals no gallop and no friction rub.  No murmur heard. Pulmonary/Chest: Effort normal and breath sounds normal. No respiratory distress. She has no wheezes.  Abdominal: Soft. Bowel sounds are normal. She exhibits no distension. There is no tenderness.  Neurological: She is alert and oriented to person, place, and time. She exhibits normal muscle tone. Coordination normal.  Skin: Skin is warm and dry. Capillary refill takes less than 2 seconds. No rash noted. No erythema.  Psychiatric: She has a normal  mood and affect. Her behavior is normal.  Nursing note and vitals reviewed.    ED Treatments / Results  Labs (all labs ordered are listed, but only abnormal results are displayed) Labs Reviewed  CBC - Abnormal; Notable for the following components:      Result Value   Hemoglobin 11.0 (*)    HCT 34.9 (*)    MCH 25.7 (*)    All other components within normal limits  URINALYSIS, ROUTINE W REFLEX MICROSCOPIC - Abnormal; Notable for the following components:   Hgb urine dipstick MODERATE (*)    Bacteria, UA RARE (*)    All other components within normal limits  BASIC METABOLIC PANEL  CBG MONITORING, ED  I-STAT BETA HCG BLOOD, ED ([facility], [facility], [facility] ONLY)    EKG None  Radiology No results found.  Procedures Procedures (including critical care time)  Medications Ordered in ED Medications  sodium chloride 0.9 % bolus 1,000 mL (has no administration in time range)  sodium chloride 0.9 % bolus 1,000 mL (1,000 mLs Intravenous New Bag/Given 05/14/18 1354)     Initial Impression / Assessment and Plan / ED Course  I have reviewed the triage vital signs and the nursing notes.  Pertinent labs & imaging results that were available during my care of the patient were reviewed by me and considered in my medical decision making (see chart for details).     Patient also states that the symptoms seem to worsen around her menstrual cycle.  The patient states that she had anemia in the past and was checked by her doctor several days ago and the 11.1.  The patient states that she is supposed to be taking iron but does not do so.  Patient will be given IV fluids here in the emergency department.  She was rechecked and she is had 500 mL's of the liter bolus and she is feeling better at this time.  Patient will most likely will be discharged home and told to return here as needed.  I have advised her to follow-up with her primary care doctor.  Feel that this is a combination effect of her.  Along with some  mild dehydration as the patient states she does not drink a lot of water.  Final Clinical Impressions(s) / ED Diagnoses   Final diagnoses:  None    ED Discharge Orders    None       Dalia Heading, PA-C 05/14/18 1528    Davonna Belling, MD 05/14/18 1530

## 2018-05-19 DIAGNOSIS — H9192 Unspecified hearing loss, left ear: Secondary | ICD-10-CM | POA: Insufficient documentation

## 2018-05-19 DIAGNOSIS — R42 Dizziness and giddiness: Secondary | ICD-10-CM | POA: Insufficient documentation

## 2018-05-21 ENCOUNTER — Telehealth: Payer: Self-pay

## 2018-05-21 NOTE — Telephone Encounter (Signed)
Left pt v/m to call office back Anita Brine FNP-BC wanted to see how she was doing and if she has seen ENT for her Vertigo. YRL,RMA

## 2018-06-01 ENCOUNTER — Other Ambulatory Visit: Payer: Self-pay | Admitting: Otolaryngology

## 2018-06-01 ENCOUNTER — Ambulatory Visit
Admission: RE | Admit: 2018-06-01 | Discharge: 2018-06-01 | Disposition: A | Discharge status: Home / Self Care | Payer: 59 | Source: Ambulatory Visit | Attending: Obstetrics and Gynecology | Admitting: Obstetrics and Gynecology

## 2018-06-01 DIAGNOSIS — Z1231 Encounter for screening mammogram for malignant neoplasm of breast: Secondary | ICD-10-CM

## 2018-06-01 DIAGNOSIS — H918X2 Other specified hearing loss, left ear: Secondary | ICD-10-CM

## 2018-06-01 DIAGNOSIS — IMO0001 Reserved for inherently not codable concepts without codable children: Secondary | ICD-10-CM

## 2018-06-02 ENCOUNTER — Other Ambulatory Visit: Payer: Self-pay | Admitting: Otolaryngology

## 2018-06-07 ENCOUNTER — Ambulatory Visit
Admission: RE | Admit: 2018-06-07 | Discharge: 2018-06-07 | Disposition: A | Discharge status: Home / Self Care | Payer: 59 | Source: Ambulatory Visit | Attending: Otolaryngology | Admitting: Otolaryngology

## 2018-06-07 DIAGNOSIS — IMO0001 Reserved for inherently not codable concepts without codable children: Secondary | ICD-10-CM

## 2018-06-07 DIAGNOSIS — H918X2 Other specified hearing loss, left ear: Secondary | ICD-10-CM

## 2018-06-07 MED ORDER — GADOBENATE DIMEGLUMINE 529 MG/ML IV SOLN
20.0000 mL | Freq: Once | INTRAVENOUS | Status: AC | PRN
  Administered 2018-06-07: 20 mL via INTRAVENOUS

## 2018-07-23 ENCOUNTER — Ambulatory Visit: Payer: Self-pay | Admitting: Nurse Practitioner

## 2018-08-21 ENCOUNTER — Encounter: Payer: Self-pay | Admitting: Nurse Practitioner

## 2018-08-21 ENCOUNTER — Ambulatory Visit (INDEPENDENT_AMBULATORY_CARE_PROVIDER_SITE_OTHER): Payer: 59 | Admitting: Nurse Practitioner

## 2018-08-21 ENCOUNTER — Other Ambulatory Visit: Payer: Self-pay

## 2018-08-21 VITALS — BP 132/72 | HR 75 | Temp 98.6°F | Ht 64.6 in | Wt 230.8 lb

## 2018-08-21 DIAGNOSIS — Z6835 Body mass index (BMI) 35.0-35.9, adult: Secondary | ICD-10-CM | POA: Diagnosis not present

## 2018-08-21 DIAGNOSIS — E6609 Other obesity due to excess calories: Secondary | ICD-10-CM | POA: Diagnosis not present

## 2018-08-21 DIAGNOSIS — E66812 Obesity, class 2: Secondary | ICD-10-CM | POA: Insufficient documentation

## 2018-08-21 DIAGNOSIS — I10 Essential (primary) hypertension: Secondary | ICD-10-CM | POA: Diagnosis not present

## 2018-08-21 NOTE — Progress Notes (Signed)
Subjective:     Patient ID: [name] , female    DOB: [birth year] , 43 y.o.   MRN: [id number]   Chief Complaint  Patient presents with  . Hypertension    HPI  Hypertension  This is a chronic problem. The current episode started more than 1 year ago. The problem is controlled. Pertinent negatives include no anxiety, chest pain, malaise/fatigue or palpitations. There are no associated agents to hypertension. Risk factors for coronary artery disease include sedentary lifestyle and obesity. Past treatments include diuretics. The current treatment provides no improvement. There are no compliance problems.  There is no history of angina. There is no history of chronic renal disease.     Past Medical History:  Diagnosis Date  . Anemia   . Hyperlipidemia    patient denies  . Hypertension   . Mass    on rt inner thigh     Family History  Problem Relation Age of Onset  . COPD Mother      Current Outpatient Medications:  .  [place] 0.12-0.015 MG/24HR vaginal ring, Place 1 application vaginally every 30 (thirty) days., Disp: , Rfl: 11 .  olmesartan-hydrochlorothiazide (BENICAR HCT) 40-25 MG tablet, Take 1 tablet by mouth daily., Disp: , Rfl: 1 .  oxyCODONE-acetaminophen (PERCOCET/ROXICET) 5-325 MG per tablet, Take 2 tablets by mouth every 4 (four) hours as needed for severe pain. (Patient not taking: Reported on 07/07/2015), Disp: 10 tablet, Rfl: 0 .  phentermine (ADIPEX-P) 37.5 MG tablet, Take 37.5 mg by mouth daily., Disp: , Rfl: 0   Allergies  Allergen Reactions  . Hydrocodone-Acetaminophen Nausea And Vomiting     Review of Systems  Constitutional: Negative for fatigue and malaise/fatigue.  Respiratory: Negative.  Negative for cough.   Cardiovascular: Negative.  Negative for chest pain, palpitations and leg swelling.  Endocrine: Negative for polydipsia, polyphagia and polyuria.     Today's Vitals   08/21/18 0909  BP: 132/72  Pulse: 75  Temp: 98.6 F (37 C)   TempSrc: Oral  SpO2: 97%  Weight: 230 lb 12.8 oz (104.7 kg)  Height: 5' 4.6" (1.641 m)   Body mass index is 38.88 kg/m.   Objective:  Physical Exam Vitals signs reviewed.  Constitutional:      Appearance: She is well-developed.  HENT:     Head: Normocephalic and atraumatic.  Eyes:     Pupils: Pupils are equal, round, and reactive to light.  Cardiovascular:     Rate and Rhythm: Normal rate and regular rhythm.     Pulses: Normal pulses.     Heart sounds: Normal heart sounds. No murmur.  Pulmonary:     Effort: Pulmonary effort is normal.     Breath sounds: Normal breath sounds.  Musculoskeletal:        General: Tenderness: cervical and low back.  Skin:    General: Skin is warm and dry.     Capillary Refill: Capillary refill takes less than 2 seconds.  Neurological:     General: No focal deficit present.     Mental Status: She is alert and oriented to person, place, and time.     Cranial Nerves: No cranial nerve deficit.  Psychiatric:        Mood and Affect: Mood normal.        Behavior: Behavior normal.        Thought Content: Thought content normal.        Judgment: Judgment normal.         Assessment And  Plan:     1. Essential hypertension . B/P is controlled.  . CMP ordered to check renal function.  . The importance of regular exercise and dietary modification was stressed to the patient.  . Stressed importance of losing ten percent of her body weight to help with B/P control.  . The weight loss would help with decreasing cardiac and cancer risk as well.  - BMP8+eGFR - Hemoglobin A1c  2. Class 2 obesity due to excess calories without serious comorbidity with body mass index (BMI) of 35.0 to 35.9 in adult  Chronic  Discussed healthy diet and regular exercise options   Encouraged to exercise at least 150 minutes per week with 2 days of strength training  Given handout for http://[email or web address]-mendoza.[email or web address] 10 tips, CDC exercise for adults and CDC Eat More Weight  Less  Discussed the importance to increase her physical.   - BMP8+eGFR - Hemoglobin A1c     [name], FNP

## 2018-08-27 ENCOUNTER — Ambulatory Visit (INDEPENDENT_AMBULATORY_CARE_PROVIDER_SITE_OTHER): Payer: 59 | Admitting: Nurse Practitioner

## 2018-08-27 ENCOUNTER — Encounter: Payer: Self-pay | Admitting: Nurse Practitioner

## 2018-08-27 VITALS — BP 112/80 | HR 88 | Temp 98.7°F | Ht 64.6 in | Wt 230.0 lb

## 2018-08-27 DIAGNOSIS — R591 Generalized enlarged lymph nodes: Secondary | ICD-10-CM | POA: Diagnosis not present

## 2018-08-27 MED ORDER — FLUCONAZOLE 150 MG PO TABS: ORAL_TABLET | ORAL | 0 refills | Status: DC

## 2018-08-27 MED ORDER — AMOXICILLIN 875 MG PO TABS: 875.0000 mg | ORAL_TABLET | Freq: Two times a day (BID) | ORAL | 0 refills | Status: DC

## 2018-08-27 NOTE — Progress Notes (Signed)
  Subjective:     Patient ID: [name] , female    DOB: [birth year] , 43 y.o.   MRN: [id number]   Chief Complaint  Patient presents with  . Mass    Theres a knot in the side of her neck and it is painful    HPI  Knot on left side of neck - went to dentist and needs her wisdom teeth out.  She felt a knot to the back of her left side of neck.  Drank hot tea and robitussin.  Soreness to left side of neck. Painful when touch.  Around lunch time she felt feverish.      Past Medical History:  Diagnosis Date  . Anemia   . Hyperlipidemia    patient denies  . Hypertension   . Mass    on rt inner thigh     Family History  Problem Relation Age of Onset  . COPD Mother      Current Outpatient Medications:  .  [place] 0.12-0.015 MG/24HR vaginal ring, Place 1 application vaginally every 30 (thirty) days., Disp: , Rfl: 11 .  olmesartan-hydrochlorothiazide (BENICAR HCT) 40-25 MG tablet, Take 1 tablet by mouth daily., Disp: , Rfl: 1   Allergies  Allergen Reactions  . Hydrocodone-Acetaminophen Nausea And Vomiting     Review of Systems  Constitutional: Negative.  Negative for fatigue and fever.  HENT: Negative.  Negative for congestion, sinus pressure and sore throat.   Eyes: Negative.   Respiratory: Negative for cough.   Cardiovascular: Negative for chest pain, palpitations and leg swelling.  Neurological: Negative for dizziness and headaches.  Hematological: Positive for adenopathy (left side).     Today's Vitals   08/27/18 1518  BP: 112/80  Pulse: 88  Temp: 98.7 F (37.1 C)  TempSrc: Oral  SpO2: 97%  Weight: 230 lb (104.3 kg)  Height: 5' 4.6" (1.641 m)  PainSc: 0-No pain   Body mass index is 38.75 kg/m.   Objective:  Physical Exam Neck:     Musculoskeletal: Muscular tenderness (left posterior chain at shoulder ) present.  Cardiovascular:     Rate and Rhythm: Normal rate and regular rhythm.     Pulses: Normal pulses.     Heart sounds: Normal heart sounds.  No murmur.  Pulmonary:     Effort: Pulmonary effort is normal. No respiratory distress.     Breath sounds: Normal breath sounds.  Musculoskeletal: Normal range of motion.        General: No swelling.  Skin:    General: Skin is warm and dry.     Capillary Refill: Capillary refill takes less than 2 seconds.  Neurological:     General: No focal deficit present.         Assessment And Plan:     1. Lymphadenopathy  Tender posterior cervical chain  - CBC with Diff - amoxicillin (AMOXIL) 875 MG tablet; Take 1 tablet (875 mg total) by mouth 2 (two) times daily.  Dispense: 14 tablet; Refill: 0       [name], FNP

## 2018-08-28 LAB — CBC WITH DIFFERENTIAL/PLATELET

## 2018-08-28 LAB — HIV ANTIBODY (ROUTINE TESTING W REFLEX): HIV SCREEN 4TH GENERATION: NONREACTIVE

## 2018-09-01 ENCOUNTER — Encounter: Payer: Self-pay | Admitting: Nurse Practitioner

## 2018-09-11 ENCOUNTER — Observation Stay (HOSPITAL_COMMUNITY): Payer: 59

## 2018-09-11 ENCOUNTER — Observation Stay (HOSPITAL_COMMUNITY): Payer: 59 | Admitting: Certified Registered Nurse Anesthetist

## 2018-09-11 ENCOUNTER — Encounter (HOSPITAL_COMMUNITY): Payer: Self-pay

## 2018-09-11 ENCOUNTER — Observation Stay (HOSPITAL_COMMUNITY)
Admission: EM | Admit: 2018-09-11 | Discharge: 2018-09-12 | Disposition: A | Discharge status: Home / Self Care | Payer: 59 | Attending: Surgery | Admitting: Surgery

## 2018-09-11 ENCOUNTER — Emergency Department (HOSPITAL_COMMUNITY): Payer: 59

## 2018-09-11 ENCOUNTER — Encounter (HOSPITAL_COMMUNITY)
Admission: EM | Disposition: A | Discharge status: Home / Self Care | Payer: Self-pay | Source: Home / Self Care | Attending: Emergency Medicine

## 2018-09-11 ENCOUNTER — Other Ambulatory Visit: Payer: Self-pay

## 2018-09-11 DIAGNOSIS — L309 Dermatitis, unspecified: Secondary | ICD-10-CM

## 2018-09-11 DIAGNOSIS — K8012 Calculus of gallbladder with acute and chronic cholecystitis without obstruction: Principal | ICD-10-CM | POA: Insufficient documentation

## 2018-09-11 DIAGNOSIS — Z79899 Other long term (current) drug therapy: Secondary | ICD-10-CM | POA: Insufficient documentation

## 2018-09-11 DIAGNOSIS — I10 Essential (primary) hypertension: Secondary | ICD-10-CM | POA: Insufficient documentation

## 2018-09-11 DIAGNOSIS — A6009 Herpesviral infection of other urogenital tract: Secondary | ICD-10-CM | POA: Insufficient documentation

## 2018-09-11 DIAGNOSIS — A5901 Trichomonal vulvovaginitis: Secondary | ICD-10-CM

## 2018-09-11 DIAGNOSIS — K8000 Calculus of gallbladder with acute cholecystitis without obstruction: Secondary | ICD-10-CM

## 2018-09-11 DIAGNOSIS — J0100 Acute maxillary sinusitis, unspecified: Secondary | ICD-10-CM

## 2018-09-11 DIAGNOSIS — R1013 Epigastric pain: Secondary | ICD-10-CM

## 2018-09-11 DIAGNOSIS — Z793 Long term (current) use of hormonal contraceptives: Secondary | ICD-10-CM | POA: Diagnosis not present

## 2018-09-11 DIAGNOSIS — K810 Acute cholecystitis: Secondary | ICD-10-CM | POA: Diagnosis present

## 2018-09-11 DIAGNOSIS — B3731 Acute candidiasis of vulva and vagina: Secondary | ICD-10-CM | POA: Insufficient documentation

## 2018-09-11 DIAGNOSIS — Z885 Allergy status to narcotic agent status: Secondary | ICD-10-CM | POA: Insufficient documentation

## 2018-09-11 DIAGNOSIS — L280 Lichen simplex chronicus: Secondary | ICD-10-CM | POA: Insufficient documentation

## 2018-09-11 DIAGNOSIS — J209 Acute bronchitis, unspecified: Secondary | ICD-10-CM

## 2018-09-11 DIAGNOSIS — N390 Urinary tract infection, site not specified: Secondary | ICD-10-CM

## 2018-09-11 DIAGNOSIS — Z6836 Body mass index (BMI) 36.0-36.9, adult: Secondary | ICD-10-CM | POA: Diagnosis not present

## 2018-09-11 DIAGNOSIS — E6609 Other obesity due to excess calories: Secondary | ICD-10-CM | POA: Insufficient documentation

## 2018-09-11 DIAGNOSIS — Z419 Encounter for procedure for purposes other than remedying health state, unspecified: Secondary | ICD-10-CM

## 2018-09-11 DIAGNOSIS — Z6835 Body mass index (BMI) 35.0-35.9, adult: Secondary | ICD-10-CM

## 2018-09-11 DIAGNOSIS — N898 Other specified noninflammatory disorders of vagina: Secondary | ICD-10-CM | POA: Insufficient documentation

## 2018-09-11 DIAGNOSIS — B373 Candidiasis of vulva and vagina: Secondary | ICD-10-CM | POA: Insufficient documentation

## 2018-09-11 HISTORY — DX: Acute bronchitis, unspecified: J20.9

## 2018-09-11 HISTORY — DX: Urinary tract infection, site not specified: N39.0

## 2018-09-11 HISTORY — PX: LAPAROSCOPIC CHOLECYSTECTOMY SINGLE SITE WITH INTRAOPERATIVE CHOLANGIOGRAM: SHX6538

## 2018-09-11 HISTORY — DX: Herpesviral infection of other urogenital tract: A60.09

## 2018-09-11 HISTORY — DX: Calculus of gallbladder with acute cholecystitis without obstruction: K80.00

## 2018-09-11 HISTORY — DX: Acute maxillary sinusitis, unspecified: J01.00

## 2018-09-11 HISTORY — DX: Trichomonal vulvovaginitis: A59.01

## 2018-09-11 HISTORY — DX: Dermatitis, unspecified: L30.9

## 2018-09-11 LAB — CBC WITH DIFFERENTIAL/PLATELET
ABS IMMATURE GRANULOCYTES: 0.03 10*3/uL (ref 0.00–0.07)
Basophils Absolute: 0 10*3/uL (ref 0.0–0.1)
Basophils Relative: 0 %
EOS PCT: 1 %
Eosinophils Absolute: 0.1 10*3/uL (ref 0.0–0.5)
HCT: 36.2 % (ref 36.0–46.0)
HEMOGLOBIN: 11.6 g/dL — AB (ref 12.0–15.0)
IMMATURE GRANULOCYTES: 1 %
LYMPHS ABS: 2.8 10*3/uL (ref 0.7–4.0)
LYMPHS PCT: 46 %
MCH: 26.9 pg (ref 26.0–34.0)
MCHC: 32 g/dL (ref 30.0–36.0)
MCV: 84 fL (ref 80.0–100.0)
MONO ABS: 0.5 10*3/uL (ref 0.1–1.0)
Monocytes Relative: 9 %
NEUTROS ABS: 2.6 10*3/uL (ref 1.7–7.7)
NRBC: 0 % (ref 0.0–0.2)
Neutrophils Relative %: 43 %
PLATELETS: 277 10*3/uL (ref 150–400)
RBC: 4.31 MIL/uL (ref 3.87–5.11)
RDW: 13.6 % (ref 11.5–15.5)
WBC: 6.1 10*3/uL (ref 4.0–10.5)

## 2018-09-11 LAB — URINALYSIS, ROUTINE W REFLEX MICROSCOPIC
Bilirubin Urine: NEGATIVE
GLUCOSE, UA: NEGATIVE mg/dL
HGB URINE DIPSTICK: NEGATIVE
KETONES UR: NEGATIVE mg/dL
NITRITE: NEGATIVE
PROTEIN: 30 mg/dL — AB
Specific Gravity, Urine: 1.027 (ref 1.005–1.030)
pH: 7 (ref 5.0–8.0)

## 2018-09-11 LAB — COMPREHENSIVE METABOLIC PANEL
ALK PHOS: 67 U/L (ref 38–126)
ALT: 25 U/L (ref 0–44)
AST: 18 U/L (ref 15–41)
Albumin: 3.7 g/dL (ref 3.5–5.0)
Anion gap: 5 (ref 5–15)
BUN: 10 mg/dL (ref 6–20)
CHLORIDE: 105 mmol/L (ref 98–111)
CO2: 28 mmol/L (ref 22–32)
Calcium: 8.6 mg/dL — ABNORMAL LOW (ref 8.9–10.3)
Creatinine, Ser: 0.74 mg/dL (ref 0.44–1.00)
GFR calc Af Amer: >60 mL/min (ref >60)
GFR calc non Af Amer: >60 mL/min (ref >60)
Glucose, Bld: 104 mg/dL — ABNORMAL HIGH (ref 70–99)
Potassium: 3.4 mmol/L — ABNORMAL LOW (ref 3.5–5.1)
SODIUM: 138 mmol/L (ref 135–145)
Total Bilirubin: 0.1 mg/dL — ABNORMAL LOW (ref 0.3–1.2)
Total Protein: 7.5 g/dL (ref 6.5–8.1)

## 2018-09-11 LAB — HCG, SERUM, QUALITATIVE: Preg, Serum: NEGATIVE

## 2018-09-11 LAB — SURGICAL PCR SCREEN
MRSA, PCR: NEGATIVE
Staphylococcus aureus: NEGATIVE

## 2018-09-11 LAB — LIPASE, BLOOD: Lipase: 34 U/L (ref 11–51)

## 2018-09-11 SURGERY — LAPAROSCOPIC CHOLECYSTECTOMY SINGLE SITE WITH INTRAOPERATIVE CHOLANGIOGRAM
Anesthesia: General

## 2018-09-11 MED ORDER — PROPOFOL 10 MG/ML IV BOLUS
INTRAVENOUS | Status: AC
  Filled 2018-09-11: qty 60

## 2018-09-11 MED ORDER — HYDROCORTISONE 1 % EX CREA: 1.0000 "application " | TOPICAL_CREAM | Freq: Three times a day (TID) | CUTANEOUS | Status: DC | PRN

## 2018-09-11 MED ORDER — HYDROCORTISONE 2.5 % RE CREA: 1.0000 "application " | TOPICAL_CREAM | Freq: Four times a day (QID) | RECTAL | Status: DC | PRN

## 2018-09-11 MED ORDER — ONDANSETRON HCL 4 MG PO TABS: 4.0000 mg | ORAL_TABLET | Freq: Three times a day (TID) | ORAL | 2 refills | Status: DC | PRN

## 2018-09-11 MED ORDER — ONDANSETRON HCL 4 MG/2ML IJ SOLN
4.0000 mg | Freq: Once | INTRAMUSCULAR | Status: AC
  Administered 2018-09-11: 4 mg via INTRAVENOUS
  Filled 2018-09-11: qty 2

## 2018-09-11 MED ORDER — PROCHLORPERAZINE EDISYLATE 10 MG/2ML IJ SOLN: 5.0000 mg | INTRAMUSCULAR | Status: DC | PRN

## 2018-09-11 MED ORDER — POLYETHYLENE GLYCOL 3350 17 G PO PACK: 17.0000 g | PACK | Freq: Every day | ORAL | Status: DC | PRN

## 2018-09-11 MED ORDER — DIPHENHYDRAMINE HCL 50 MG/ML IJ SOLN: 12.5000 mg | Freq: Four times a day (QID) | INTRAMUSCULAR | Status: DC | PRN

## 2018-09-11 MED ORDER — BUPIVACAINE-EPINEPHRINE 0.25% -1:200000 IJ SOLN
INTRAMUSCULAR | Status: DC | PRN
  Administered 2018-09-11: 60 mL

## 2018-09-11 MED ORDER — ONDANSETRON HCL 4 MG/2ML IJ SOLN: 4.0000 mg | Freq: Four times a day (QID) | INTRAMUSCULAR | Status: DC | PRN

## 2018-09-11 MED ORDER — ROCURONIUM BROMIDE 100 MG/10ML IV SOLN
INTRAVENOUS | Status: AC
  Filled 2018-09-11: qty 1

## 2018-09-11 MED ORDER — BISACODYL 10 MG RE SUPP: 10.0000 mg | Freq: Two times a day (BID) | RECTAL | Status: DC | PRN

## 2018-09-11 MED ORDER — BUPIVACAINE HCL (PF) 0.5 % IJ SOLN
INTRAMUSCULAR | Status: AC
  Filled 2018-09-11: qty 30

## 2018-09-11 MED ORDER — MORPHINE SULFATE (PF) 2 MG/ML IV SOLN: 1.0000 mg | INTRAVENOUS | Status: DC | PRN

## 2018-09-11 MED ORDER — SODIUM CHLORIDE 0.9% FLUSH: 3.0000 mL | INTRAVENOUS | Status: DC | PRN

## 2018-09-11 MED ORDER — GABAPENTIN 300 MG PO CAPS
300.0000 mg | ORAL_CAPSULE | Freq: Every day | ORAL | Status: DC
  Administered 2018-09-11: 300 mg via ORAL
  Filled 2018-09-11: qty 1

## 2018-09-11 MED ORDER — ACETAMINOPHEN 325 MG PO TABS: 650.0000 mg | ORAL_TABLET | Freq: Four times a day (QID) | ORAL | Status: DC | PRN

## 2018-09-11 MED ORDER — ENOXAPARIN SODIUM 40 MG/0.4ML ~~LOC~~ SOLN: 40.0000 mg | SUBCUTANEOUS | Status: DC

## 2018-09-11 MED ORDER — SCOPOLAMINE 1 MG/3DAYS TD PT72
MEDICATED_PATCH | TRANSDERMAL | Status: AC
  Filled 2018-09-11: qty 1

## 2018-09-11 MED ORDER — OXYCODONE HCL 5 MG PO TABS
5.0000 mg | ORAL_TABLET | ORAL | Status: DC | PRN
  Administered 2018-09-11: 5 mg via ORAL
  Administered 2018-09-12 (×2): 10 mg via ORAL
  Filled 2018-09-11: qty 2
  Filled 2018-09-11: qty 1
  Filled 2018-09-11: qty 2

## 2018-09-11 MED ORDER — ACETAMINOPHEN 650 MG RE SUPP: 650.0000 mg | Freq: Four times a day (QID) | RECTAL | Status: DC | PRN

## 2018-09-11 MED ORDER — IBUPROFEN 200 MG PO TABS: 400.0000 mg | ORAL_TABLET | Freq: Four times a day (QID) | ORAL | Status: DC | PRN

## 2018-09-11 MED ORDER — GUAIFENESIN-DM 100-10 MG/5ML PO SYRP: 10.0000 mL | ORAL_SOLUTION | ORAL | Status: DC | PRN

## 2018-09-11 MED ORDER — SIMETHICONE 80 MG PO CHEW: 40.0000 mg | CHEWABLE_TABLET | Freq: Four times a day (QID) | ORAL | Status: DC | PRN

## 2018-09-11 MED ORDER — HYDROCHLOROTHIAZIDE 25 MG PO TABS
25.0000 mg | ORAL_TABLET | Freq: Every day | ORAL | Status: DC
  Administered 2018-09-12: 25 mg via ORAL
  Filled 2018-09-11: qty 1

## 2018-09-11 MED ORDER — SODIUM CHLORIDE 0.9 % IV SOLN
8.0000 mg | Freq: Four times a day (QID) | INTRAVENOUS | Status: DC | PRN
  Filled 2018-09-11: qty 4

## 2018-09-11 MED ORDER — MAGIC MOUTHWASH
15.0000 mL | Freq: Four times a day (QID) | ORAL | Status: DC | PRN
  Filled 2018-09-11: qty 15

## 2018-09-11 MED ORDER — DEXAMETHASONE SODIUM PHOSPHATE 10 MG/ML IJ SOLN
INTRAMUSCULAR | Status: DC | PRN
  Administered 2018-09-11: 10 mg via INTRAVENOUS

## 2018-09-11 MED ORDER — OXYCODONE HCL 5 MG PO TABS: 5.0000 mg | ORAL_TABLET | ORAL | Status: DC | PRN

## 2018-09-11 MED ORDER — PROPOFOL 10 MG/ML IV BOLUS
INTRAVENOUS | Status: AC
  Filled 2018-09-11: qty 20

## 2018-09-11 MED ORDER — POTASSIUM CHLORIDE IN NACL 20-0.45 MEQ/L-% IV SOLN
INTRAVENOUS | Status: DC
  Filled 2018-09-11: qty 1000

## 2018-09-11 MED ORDER — BUPIVACAINE LIPOSOME 1.3 % IJ SUSP
20.0000 mL | Freq: Once | INTRAMUSCULAR | Status: AC
  Administered 2018-09-11: 20 mL
  Filled 2018-09-11: qty 20

## 2018-09-11 MED ORDER — FENTANYL CITRATE (PF) 250 MCG/5ML IJ SOLN
INTRAMUSCULAR | Status: AC
  Filled 2018-09-11: qty 5

## 2018-09-11 MED ORDER — PSYLLIUM 95 % PO PACK
1.0000 | PACK | Freq: Every day | ORAL | Status: DC
  Filled 2018-09-11: qty 1

## 2018-09-11 MED ORDER — DOCUSATE SODIUM 100 MG PO CAPS: 100.0000 mg | ORAL_CAPSULE | Freq: Two times a day (BID) | ORAL | Status: DC

## 2018-09-11 MED ORDER — ONDANSETRON HCL 4 MG/2ML IJ SOLN
INTRAMUSCULAR | Status: AC
  Filled 2018-09-11: qty 2

## 2018-09-11 MED ORDER — METOCLOPRAMIDE HCL 5 MG/ML IJ SOLN: 5.0000 mg | Freq: Three times a day (TID) | INTRAMUSCULAR | Status: DC | PRN

## 2018-09-11 MED ORDER — LIP MEDEX EX OINT
1.0000 "application " | TOPICAL_OINTMENT | Freq: Two times a day (BID) | CUTANEOUS | Status: DC
  Administered 2018-09-11 – 2018-09-12 (×2): 1 via TOPICAL
  Filled 2018-09-11 (×2): qty 7

## 2018-09-11 MED ORDER — ACETAMINOPHEN 500 MG PO TABS
1000.0000 mg | ORAL_TABLET | Freq: Three times a day (TID) | ORAL | Status: DC
  Administered 2018-09-11 – 2018-09-12 (×2): 1000 mg via ORAL
  Filled 2018-09-11 (×2): qty 2

## 2018-09-11 MED ORDER — SCOPOLAMINE 1 MG/3DAYS TD PT72
MEDICATED_PATCH | TRANSDERMAL | Status: DC | PRN
  Administered 2018-09-11: 1 via TRANSDERMAL

## 2018-09-11 MED ORDER — DEXAMETHASONE SODIUM PHOSPHATE 10 MG/ML IJ SOLN
INTRAMUSCULAR | Status: AC
  Filled 2018-09-11: qty 1

## 2018-09-11 MED ORDER — FENTANYL CITRATE (PF) 250 MCG/5ML IJ SOLN
INTRAMUSCULAR | Status: DC | PRN
  Administered 2018-09-11 (×2): 100 ug via INTRAVENOUS
  Administered 2018-09-11: 50 ug via INTRAVENOUS
  Administered 2018-09-11: 100 ug via INTRAVENOUS

## 2018-09-11 MED ORDER — CALCIUM CARBONATE ANTACID 500 MG PO CHEW: 1.0000 | CHEWABLE_TABLET | Freq: Three times a day (TID) | ORAL | Status: DC | PRN

## 2018-09-11 MED ORDER — PROPOFOL 10 MG/ML IV BOLUS
INTRAVENOUS | Status: DC | PRN
  Administered 2018-09-11: 200 mg via INTRAVENOUS

## 2018-09-11 MED ORDER — FENTANYL CITRATE (PF) 100 MCG/2ML IJ SOLN: 25.0000 ug | INTRAMUSCULAR | Status: DC | PRN

## 2018-09-11 MED ORDER — LACTATED RINGERS IV SOLN
INTRAVENOUS | Status: DC
  Administered 2018-09-11: 16:00:00 via INTRAVENOUS

## 2018-09-11 MED ORDER — METHOCARBAMOL 1000 MG/10ML IJ SOLN
1000.0000 mg | Freq: Four times a day (QID) | INTRAVENOUS | Status: DC | PRN
  Filled 2018-09-11: qty 10

## 2018-09-11 MED ORDER — PHENOL 1.4 % MT LIQD: 1.0000 | OROMUCOSAL | Status: DC | PRN

## 2018-09-11 MED ORDER — ROCURONIUM BROMIDE 10 MG/ML (PF) SYRINGE
PREFILLED_SYRINGE | INTRAVENOUS | Status: DC | PRN
  Administered 2018-09-11: 50 mg via INTRAVENOUS
  Administered 2018-09-11: 20 mg via INTRAVENOUS

## 2018-09-11 MED ORDER — DIPHENHYDRAMINE HCL 25 MG PO CAPS: 25.0000 mg | ORAL_CAPSULE | Freq: Four times a day (QID) | ORAL | Status: DC | PRN

## 2018-09-11 MED ORDER — LIDOCAINE VISCOUS HCL 2 % MT SOLN
15.0000 mL | Freq: Once | OROMUCOSAL | Status: AC
  Administered 2018-09-11: 15 mL via ORAL
  Filled 2018-09-11: qty 15

## 2018-09-11 MED ORDER — SUGAMMADEX SODIUM 200 MG/2ML IV SOLN
INTRAVENOUS | Status: DC | PRN
  Administered 2018-09-11: 200 mg via INTRAVENOUS

## 2018-09-11 MED ORDER — MIDAZOLAM HCL 2 MG/2ML IJ SOLN
INTRAMUSCULAR | Status: AC
  Filled 2018-09-11: qty 2

## 2018-09-11 MED ORDER — SODIUM CHLORIDE 0.9 % IV SOLN
2.0000 g | INTRAVENOUS | Status: DC
  Administered 2018-09-11: 2 g via INTRAVENOUS
  Filled 2018-09-11: qty 20

## 2018-09-11 MED ORDER — OXYCODONE HCL 5 MG PO TABS: 5.0000 mg | ORAL_TABLET | ORAL | 0 refills | Status: DC | PRN

## 2018-09-11 MED ORDER — SUGAMMADEX SODIUM 200 MG/2ML IV SOLN
INTRAVENOUS | Status: AC
  Filled 2018-09-11: qty 2

## 2018-09-11 MED ORDER — LIDOCAINE 2% (20 MG/ML) 5 ML SYRINGE
INTRAMUSCULAR | Status: DC | PRN
  Administered 2018-09-11: 100 mg via INTRAVENOUS

## 2018-09-11 MED ORDER — MENTHOL 3 MG MT LOZG
1.0000 | LOZENGE | OROMUCOSAL | Status: DC | PRN
  Administered 2018-09-12: 3 mg via ORAL
  Filled 2018-09-11: qty 9

## 2018-09-11 MED ORDER — TAB-A-VITE/IRON PO TABS
1.0000 | ORAL_TABLET | Freq: Every day | ORAL | Status: DC
  Administered 2018-09-12: 1 via ORAL
  Filled 2018-09-11 (×2): qty 1

## 2018-09-11 MED ORDER — PROPOFOL 500 MG/50ML IV EMUL
INTRAVENOUS | Status: DC | PRN
  Administered 2018-09-11: 200 ug/kg/min via INTRAVENOUS

## 2018-09-11 MED ORDER — ALUM & MAG HYDROXIDE-SIMETH 200-200-20 MG/5ML PO SUSP: 30.0000 mL | Freq: Four times a day (QID) | ORAL | Status: DC | PRN

## 2018-09-11 MED ORDER — METRONIDAZOLE IN NACL 5-0.79 MG/ML-% IV SOLN
500.0000 mg | Freq: Once | INTRAVENOUS | Status: AC
  Administered 2018-09-11: 500 mg via INTRAVENOUS
  Filled 2018-09-11: qty 100

## 2018-09-11 MED ORDER — METRONIDAZOLE IN NACL 5-0.79 MG/ML-% IV SOLN
INTRAVENOUS | Status: AC
  Filled 2018-09-11: qty 100

## 2018-09-11 MED ORDER — IOPAMIDOL (ISOVUE-300) INJECTION 61%
INTRAVENOUS | Status: AC
  Filled 2018-09-11: qty 50

## 2018-09-11 MED ORDER — LACTATED RINGERS IV BOLUS: 1000.0000 mL | Freq: Three times a day (TID) | INTRAVENOUS | Status: DC | PRN

## 2018-09-11 MED ORDER — OLMESARTAN MEDOXOMIL-HCTZ 40-25 MG PO TABS: 1.0000 | ORAL_TABLET | Freq: Every day | ORAL | Status: DC

## 2018-09-11 MED ORDER — ONDANSETRON 4 MG PO TBDP: 4.0000 mg | ORAL_TABLET | Freq: Four times a day (QID) | ORAL | Status: DC | PRN

## 2018-09-11 MED ORDER — IOPAMIDOL (ISOVUE-300) INJECTION 61%
INTRAVENOUS | Status: DC | PRN
  Administered 2018-09-11: 50 mL via INTRAVENOUS

## 2018-09-11 MED ORDER — SODIUM CHLORIDE 0.9 % IV SOLN: 250.0000 mL | INTRAVENOUS | Status: DC | PRN

## 2018-09-11 MED ORDER — METHOCARBAMOL 500 MG PO TABS: 500.0000 mg | ORAL_TABLET | Freq: Four times a day (QID) | ORAL | Status: DC | PRN

## 2018-09-11 MED ORDER — LIDOCAINE 2% (20 MG/ML) 5 ML SYRINGE
INTRAMUSCULAR | Status: AC
  Filled 2018-09-11: qty 5

## 2018-09-11 MED ORDER — MIDAZOLAM HCL 5 MG/5ML IJ SOLN
INTRAMUSCULAR | Status: DC | PRN
  Administered 2018-09-11: 2 mg via INTRAVENOUS

## 2018-09-11 MED ORDER — METOPROLOL TARTRATE 5 MG/5ML IV SOLN: 5.0000 mg | Freq: Four times a day (QID) | INTRAVENOUS | Status: DC | PRN

## 2018-09-11 MED ORDER — IRBESARTAN 300 MG PO TABS
300.0000 mg | ORAL_TABLET | Freq: Every day | ORAL | Status: DC
  Administered 2018-09-12: 300 mg via ORAL
  Filled 2018-09-11: qty 1

## 2018-09-11 MED ORDER — FENTANYL CITRATE (PF) 100 MCG/2ML IJ SOLN
INTRAMUSCULAR | Status: AC
  Filled 2018-09-11: qty 2

## 2018-09-11 MED ORDER — HYDRALAZINE HCL 20 MG/ML IJ SOLN: 5.0000 mg | INTRAMUSCULAR | Status: DC | PRN

## 2018-09-11 MED ORDER — SODIUM CHLORIDE 0.9% FLUSH: 3.0000 mL | Freq: Two times a day (BID) | INTRAVENOUS | Status: DC

## 2018-09-11 MED ORDER — SACCHAROMYCES BOULARDII 250 MG PO CAPS
250.0000 mg | ORAL_CAPSULE | Freq: Two times a day (BID) | ORAL | Status: DC
  Administered 2018-09-11 – 2018-09-12 (×2): 250 mg via ORAL
  Filled 2018-09-11 (×2): qty 1

## 2018-09-11 MED ORDER — PROMETHAZINE HCL 25 MG/ML IJ SOLN
INTRAMUSCULAR | Status: AC
  Filled 2018-09-11: qty 1

## 2018-09-11 MED ORDER — HYDROMORPHONE HCL 1 MG/ML IJ SOLN
0.5000 mg | INTRAMUSCULAR | Status: DC | PRN
  Administered 2018-09-11: 1 mg via INTRAVENOUS
  Filled 2018-09-11 (×2): qty 1

## 2018-09-11 MED ORDER — MORPHINE SULFATE (PF) 4 MG/ML IV SOLN
4.0000 mg | Freq: Once | INTRAVENOUS | Status: AC
  Administered 2018-09-11: 4 mg via INTRAVENOUS
  Filled 2018-09-11: qty 1

## 2018-09-11 MED ORDER — METHOCARBAMOL 500 MG PO TABS: 1000.0000 mg | ORAL_TABLET | Freq: Four times a day (QID) | ORAL | Status: DC | PRN

## 2018-09-11 MED ORDER — DIPHENHYDRAMINE HCL 50 MG/ML IJ SOLN: 25.0000 mg | Freq: Four times a day (QID) | INTRAMUSCULAR | Status: DC | PRN

## 2018-09-11 MED ORDER — ONDANSETRON HCL 4 MG/2ML IJ SOLN
INTRAMUSCULAR | Status: DC | PRN
  Administered 2018-09-11: 4 mg via INTRAVENOUS

## 2018-09-11 MED ORDER — PROMETHAZINE HCL 25 MG/ML IJ SOLN
6.2500 mg | INTRAMUSCULAR | Status: DC | PRN
  Administered 2018-09-11: 12.5 mg via INTRAVENOUS

## 2018-09-11 SURGICAL SUPPLY — 40 items
APPLIER CLIP 5 13 M/L LIGAMAX5 (MISCELLANEOUS) ×2
CABLE HIGH FREQUENCY MONO STRZ (ELECTRODE) ×2 IMPLANT
CHLORAPREP W/TINT 26ML (MISCELLANEOUS) ×2 IMPLANT
CLIP APPLIE 5 13 M/L LIGAMAX5 (MISCELLANEOUS) ×1 IMPLANT
COVER MAYO STAND STRL (DRAPES) ×2 IMPLANT
COVER SURGICAL LIGHT HANDLE (MISCELLANEOUS) ×2 IMPLANT
COVER WAND RF STERILE (DRAPES) IMPLANT
DECANTER SPIKE VIAL GLASS SM (MISCELLANEOUS) ×2 IMPLANT
DRAIN CHANNEL 19F RND (DRAIN) IMPLANT
DRAPE C-ARM 42X120 X-RAY (DRAPES) ×2 IMPLANT
DRAPE WARM FLUID 44X44 (DRAPE) ×2 IMPLANT
DRSG TEGADERM 4X4.75 (GAUZE/BANDAGES/DRESSINGS) ×2 IMPLANT
ELECT REM PT RETURN 15FT ADLT (MISCELLANEOUS) ×2 IMPLANT
ENDOLOOP SUT PDS II  0 18 (SUTURE)
ENDOLOOP SUT PDS II 0 18 (SUTURE) IMPLANT
EVACUATOR SILICONE 100CC (DRAIN) IMPLANT
GAUZE SPONGE 2X2 8PLY STRL LF (GAUZE/BANDAGES/DRESSINGS) ×1 IMPLANT
GLOVE ECLIPSE 8.0 STRL XLNG CF (GLOVE) ×2 IMPLANT
GLOVE INDICATOR 8.0 STRL GRN (GLOVE) ×2 IMPLANT
GOWN STRL REUS W/TWL XL LVL3 (GOWN DISPOSABLE) ×4 IMPLANT
IRRIG SUCT STRYKERFLOW 2 WTIP (MISCELLANEOUS) ×2
IRRIGATION SUCT STRKRFLW 2 WTP (MISCELLANEOUS) ×1 IMPLANT
KIT BASIN OR (CUSTOM PROCEDURE TRAY) ×2 IMPLANT
PAD POSITIONING PINK XL (MISCELLANEOUS) ×2 IMPLANT
POUCH RETRIEVAL ECOSAC 10 (ENDOMECHANICALS) IMPLANT
POUCH RETRIEVAL ECOSAC 10MM (ENDOMECHANICALS) ×1
PROTECTOR NERVE ULNAR (MISCELLANEOUS) ×1 IMPLANT
SCISSORS LAP 5X35 DISP (ENDOMECHANICALS) ×2 IMPLANT
SET CHOLANGIOGRAPH MIX (MISCELLANEOUS) ×2 IMPLANT
SET TUBE SMOKE EVAC HIGH FLOW (TUBING) ×2 IMPLANT
SHEARS HARMONIC ACE PLUS 36CM (ENDOMECHANICALS) ×2 IMPLANT
SPONGE GAUZE 2X2 STER 10/PKG (GAUZE/BANDAGES/DRESSINGS) ×1
SUT MNCRL AB 4-0 PS2 18 (SUTURE) ×2 IMPLANT
SUT PDS AB 1 CT1 27 (SUTURE) ×6 IMPLANT
SYR 20CC LL (SYRINGE) ×2 IMPLANT
TOWEL OR 17X26 10 PK STRL BLUE (TOWEL DISPOSABLE) ×2 IMPLANT
TOWEL OR NON WOVEN STRL DISP B (DISPOSABLE) ×2 IMPLANT
TRAY LAPAROSCOPIC (CUSTOM PROCEDURE TRAY) ×2 IMPLANT
TROCAR BLADELESS OPT 5 100 (ENDOMECHANICALS) ×2 IMPLANT
TROCAR BLADELESS OPT 5 150 (ENDOMECHANICALS) ×2 IMPLANT

## 2018-09-11 NOTE — Anesthesia Postprocedure Evaluation (Signed)
Anesthesia Post Note  Patient: [name]  Procedure(s) Performed: LAPAROSCOPIC CHOLECYSTECTOMY SINGLE SITE WITH INTRAOPERATIVE CHOLANGIOGRAM (N/A )     Patient location during evaluation: PACU Anesthesia Type: General Level of consciousness: awake and alert Pain management: pain level controlled Vital Signs Assessment: post-procedure vital signs reviewed and stable Respiratory status: spontaneous breathing, nonlabored ventilation, respiratory function stable and patient connected to nasal cannula oxygen Cardiovascular status: blood pressure returned to baseline and stable Postop Assessment: no apparent nausea or vomiting Anesthetic complications: no    Last Vitals:  Vitals:   09/11/18 1830 09/11/18 1840  BP: (!) 155/87 (!) 147/85  Pulse: (!) 109 (!) 107  Resp: (!) 22 (!) 23  Temp:  36.9 C  SpO2: 95% 97%    Last Pain:  Vitals:   09/11/18 1840  TempSrc:   PainSc: 0-No pain                 [name]

## 2018-09-11 NOTE — Anesthesia Procedure Notes (Signed)
Procedure Name: Intubation Date/Time: 09/11/2018 4:45 PM Performed by: Lollie Sails, CRNA Pre-anesthesia Checklist: Patient identified, Emergency Drugs available, Suction available, Patient being monitored and Timeout performed Patient Re-evaluated:Patient Re-evaluated prior to induction Oxygen Delivery Method: Circle system utilized Preoxygenation: Pre-oxygenation with 100% oxygen Induction Type: IV induction Ventilation: Mask ventilation without difficulty Laryngoscope Size: Miller and 2 Grade View: Grade I Tube type: Oral Tube size: 7.5 mm Number of attempts: 1 Airway Equipment and Method: Stylet Placement Confirmation: ETT inserted through vocal cords under direct vision,  positive ETCO2 and breath sounds checked- equal and bilateral Secured at: 22 cm Tube secured with: Tape Dental Injury: Teeth and Oropharynx as per pre-operative assessment

## 2018-09-11 NOTE — ED Provider Notes (Signed)
[name]   CSN: [id number] Arrival date & time: 09/11/18  0532     History   Chief Complaint Chief Complaint  Patient presents with  . Abdominal Pain  . Bloated    HPI [name] is a 43 y.o. female with history of hypertension is here for evaluation of abdominal pain.  Pain is in the epigastrium.  It began on Monday.  It is described as a "fullness".  Initially was intermittent but now it is constant, worsening last night.  Nonradiating.  It is worse after meals and laying flat.  This morning she woke up with the pain and had to sit up on her recliner which made it better. Associated symptoms include upper abdominal bloating, early satiety, nausea, increased belching and flatus.  She has had one loose BM in the last 5 days.  Typically she has 1 BM daily.  She has been passing gas.  She feels like if she were to have a bowel movement she would feel much better.  She had chills, body aches and fever up to 101 on Monday night but this resolved after 24 hours.  Has been eating and drinking less because putting food in her stomach makes the pain worse.  Has tried baking soda water, Tums without relief.   No previous history of acid reflux, PUD, gastritis.  No abdominal surgeries.  No frequent use of NSAID or EtOH.  She denies vomiting, throat regurgitation, cough, chest pain, shortness of breath.   HPI  Past Medical History:  Diagnosis Date  . Acute bronchitis 09/11/2018  . Acute dermatitis 09/11/2018  . Acute maxillary sinusitis 09/11/2018  . Acute urinary tract infection 09/11/2018  . Anemia   . 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 Hyperlipidemia    patient denies  . Hypertension   . Mass    on rt inner thigh  . Trichomonal vaginitis 09/11/2018    Patient Active Problem List   Diagnosis Date Noted  . Candidal vulvovaginitis 09/11/2018  . Lichenification [id number]/4967  . Acute calculous cholecystitis 09/11/2018  . Acute  cholecystitis 09/11/2018  . Essential hypertension 08/21/2018  . Class 2 obesity due to excess calories without serious comorbidity with body mass index (BMI) of 35.0 to 35.9 in adult 08/21/2018  . 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  . Vertigo 05/19/2018  . S/P myomectomy 07/20/2015    Class: Status post    Past Surgical History:  Procedure Laterality Date  . BREAST EXCISIONAL BIOPSY Left 2003   papiloma removed  . BREAST SURGERY     left breast papilloma  . CESAREAN SECTION  07/11/03  . MASS EXCISION  05/21/05   inner lt thigh  . MYOMECTOMY N/A 07/20/2015   Procedure: MYOMECTOMY, ABDOMINAL;  Surgeon: Arvella Nigh, MD;  Location: [facility];  Service: Gynecology;  Laterality: N/A;  . WISDOM TOOTH EXTRACTION       OB History   No obstetric history on file.      Home Medications    Prior to Admission medications   Medication Sig Start Date End Date Taking? Authorizing Provider  Multiple Vitamins-Iron (MULTIVITAMINS WITH IRON) TABS tablet Take 1 tablet by mouth daily.   Yes [provider]  NUVARING 0.12-0.015 MG/24HR vaginal ring Place 1 application vaginally every 30 (thirty) days. 03/23/18  Yes [provider]  olmesartan-hydrochlorothiazide (BENICAR HCT) 40-25 MG tablet Take 1 tablet by mouth daily. 03/31/18  Yes [provider]  amoxicillin (AMOXIL) 875 MG  tablet Take 1 tablet (875 mg total) by mouth 2 (two) times daily. Patient not taking: Reported on 09/11/2018 08/27/18   [name], FNP  fluconazole (DIFLUCAN) 150 MG tablet Take at onset of symptoms may repeat in 5 days. Patient not taking: Reported on 09/11/2018 08/27/18   [name], FNP    Family History Family History  Problem Relation Age of Onset  . COPD Mother     Social History Social History   Tobacco Use  . Smoking status: Never Smoker  . Smokeless tobacco: Never Used  Substance Use Topics  . Alcohol use: No  . Drug use: No     Allergies    Hydrocodone-acetaminophen   Review of Systems Review of Systems  Constitutional: Positive for chills (Resolved) and fever (Resolved).  Gastrointestinal: Positive for abdominal pain, constipation and nausea.       Increased belching.  Early satiety.  Abdominal bloating  All other systems reviewed and are negative.    Physical Exam Updated Vital Signs BP 123/72   Pulse 71   Temp 99 F (37.2 C) (Oral)   Resp 18   Ht 5\' 4"  (1.626 m)   Wt 97.5 kg   SpO2 97%   BMI 36.90 kg/m   Physical Exam Vitals signs and nursing note reviewed.  Constitutional:      Appearance: She is well-developed.     Comments: Non toxic  HENT:     Head: Normocephalic and atraumatic.     Nose: Nose normal.  Eyes:     Conjunctiva/sclera: Conjunctivae normal.     Pupils: Pupils are equal, round, and reactive to light.  Neck:     Musculoskeletal: Normal range of motion.  Cardiovascular:     Rate and Rhythm: Normal rate and regular rhythm.     Pulses:          Radial pulses are 1+ on the right side and 1+ on the left side.       Dorsalis pedis pulses are 1+ on the right side and 1+ on the left side.  Pulmonary:     Effort: Pulmonary effort is normal.     Breath sounds: Normal breath sounds.  Abdominal:     General: Bowel sounds are normal.     Palpations: Abdomen is soft.     Tenderness: There is abdominal tenderness in the epigastric area.     Comments: No G/R/R. No suprapubic or CVA tenderness. Negative Murphy's and McBurney's. Obese abdomen  Musculoskeletal: Normal range of motion.  Skin:    General: Skin is warm and dry.     Capillary Refill: Capillary refill takes less than 2 seconds.  Neurological:     Mental Status: She is alert and oriented to person, place, and time.  Psychiatric:        Behavior: Behavior normal.      ED Treatments / Results  Labs (all labs ordered are listed, but only abnormal results are displayed) Labs Reviewed  CBC WITH DIFFERENTIAL/PLATELET - Abnormal;  Notable for the following components:      Result Value   Hemoglobin 11.6 (*)    All other components within normal limits  COMPREHENSIVE METABOLIC PANEL - Abnormal; Notable for the following components:   Potassium 3.4 (*)    Glucose, Bld 104 (*)    Calcium 8.6 (*)    Total Bilirubin 0.1 (*)    All other components within normal limits  URINALYSIS, ROUTINE W REFLEX MICROSCOPIC - Abnormal; Notable for the following components:  APPearance HAZY (*)    Protein, ur 30 (*)    Leukocytes, UA MODERATE (*)    Bacteria, UA RARE (*)    All other components within normal limits  URINE CULTURE  LIPASE, BLOOD    EKG None  Radiology US Abdomen Limited Ruq  Result Date: 09/11/2018 CLINICAL DATA:  Epigastric pain for several days EXAM: ULTRASOUND ABDOMEN LIMITED RIGHT UPPER QUADRANT COMPARISON:  None. FINDINGS: Gallbladder: Within the gallbladder, there are echogenic foci which move and shadow consistent with cholelithiasis. Largest gallstone measures 8 mm. There is also sludge in the gallbladder. There is a suspected calculus adherent in the neck of the gallbladder. The gallbladder wall is upper normal in thickness with questionable subtle wall edema. No pericholecystic fluid. No sonographic Murphy sign noted by sonographer. Common bile duct: Diameter: 6 mm. No intrahepatic or extrahepatic biliary duct dilatation. Liver: No focal lesion identified. Within normal limits in parenchymal echogenicity. Portal vein is patent on color Doppler imaging with normal direction of blood flow towards the liver. IMPRESSION: Cholelithiasis with sludge in the gallbladder. Suspect gallstone adherent in the neck of the gallbladder. The gallbladder wall is upper normal in thickness with subtle gallbladder wall edema. These are findings that may be indicative of early cholecystitis. In this regard, it may be prudent to consider nuclear medicine hepatobiliary imaging study to assess for cystic duct patency. Study otherwise  unremarkable. Electronically Signed   By: Lowella Grip III M.D.   On: 09/11/2018 09:58    Procedures Procedures (including critical care time)  Medications Ordered in ED Medications  multivitamins with iron tablet 1 tablet (has no administration in time range)  olmesartan-hydrochlorothiazide (BENICAR HCT) 40-25 MG per tablet 1 tablet (has no administration in time range)  enoxaparin (LOVENOX) injection 40 mg (has no administration in time range)  0.45 % NaCl with KCl 20 mEq / L infusion (has no administration in time range)  cefTRIAXone (ROCEPHIN) 2 g in sodium chloride 0.9 % 100 mL IVPB (has no administration in time range)  metoprolol tartrate (LOPRESSOR) injection 5 mg (has no administration in time range)  calcium carbonate (TUMS - dosed in mg elemental calcium) chewable tablet 200 mg of elemental calcium (has no administration in time range)  simethicone (MYLICON) chewable tablet 40 mg (has no administration in time range)  ondansetron (ZOFRAN-ODT) disintegrating tablet 4 mg (has no administration in time range)    Or  ondansetron (ZOFRAN) injection 4 mg (has no administration in time range)  docusate sodium (COLACE) capsule 100 mg (has no administration in time range)  polyethylene glycol (MIRALAX / GLYCOLAX) packet 17 g (has no administration in time range)  diphenhydrAMINE (BENADRYL) capsule 25 mg (has no administration in time range)    Or  diphenhydrAMINE (BENADRYL) injection 25 mg (has no administration in time range)  methocarbamol (ROBAXIN) tablet 500 mg (has no administration in time range)  acetaminophen (TYLENOL) tablet 650 mg (has no administration in time range)    Or  acetaminophen (TYLENOL) suppository 650 mg (has no administration in time range)  morphine 2 MG/ML injection 1 mg (has no administration in time range)  oxyCODONE (Oxy IR/ROXICODONE) immediate release tablet 5 mg (has no administration in time range)  morphine 4 MG/ML injection 4 mg (4 mg  Intravenous Given 09/11/18 0700)  ondansetron (ZOFRAN) injection 4 mg (4 mg Intravenous Given 09/11/18 0656)  lidocaine (XYLOCAINE) 2 % viscous mouth solution 15 mL (15 mLs Oral Given 09/11/18 0701)     Initial Impression / Assessment and Plan /  ED Course  I have reviewed the triage vital signs and the nursing notes.  Pertinent labs & imaging results that were available during my care of the patient were reviewed by me and considered in my medical decision making (see chart for details).  Clinical Course as of Sep 11 1126  Fri Sep 11, 2018  0725 Leukocytes, UA(!): MODERATE [CG]  0725 WBC, UA: 6-10 [CG]  0725 Bacteria, UA(!): RARE [CG]  1024 IMPRESSION: Cholelithiasis with sludge in the gallbladder. Suspect gallstone adherent in the neck of the gallbladder. The gallbladder wall is upper normal in thickness with subtle gallbladder wall edema. These are findings that may be indicative of early cholecystitis. In this regard, it may be prudent to consider nuclear medicine hepatobiliary imaging study to assess for cystic duct patency.  US Abdomen Limited RUQ [CG]  8016 Spoke to [facility] surgery PA who will see patient.    [CG]    Clinical Course User Index [CG] Kinnie Feil, PA-C    Highest on ddx is biliary colic vs gastritis vs PUD vs GERD.  She has no constitutional symptoms, VS are normal and less likely acute cholecystitis, pancreatitis, viral process.  She has had decreased BMs but also decreased PO intake, still passing gas. No signs of peritonitis.  I have lower suspicion for SBO, diverticulitis, appendicitis, perforated viscus, dissection, ischemia. Will obtain symptom control, labs, reassess.   5537: Labs unremarkable. Symptoms significantly improved. Discussed results with patient.  Likelihood of acute cholecystitis and pancreatitis is unlikely, could be chololithiasis.  RUQ [place] pending.     1023: [place] confirms cholelithiasis with sludge, stone in neck of gallbladder with gallbladder  wall edema possible early cholecystitis.  Pt's symptoms have improved. Had 1/2 cup of ginger ale in ER.    1127: [place] team admitted pt.  Final Clinical Impressions(s) / ED Diagnoses   Final diagnoses:  Epigastric abdominal pain    ED Discharge Orders    None       Arlean Hopping 09/11/18 1128    Mesner, Corene Cornea, MD 09/11/18 1444

## 2018-09-11 NOTE — Anesthesia Preprocedure Evaluation (Addendum)
Anesthesia Evaluation  Patient identified by MRN, date of birth, ID band Patient awake    Reviewed: Allergy & Precautions, NPO status , Patient's Chart, lab work & pertinent test results  History of Anesthesia Complications (+) PONVNegative for: history of anesthetic complications  Airway Mallampati: II  TM Distance: >3 FB Neck ROM: Full    Dental  (+) Teeth Intact, Dental Advisory Given   Pulmonary neg pulmonary ROS,    breath sounds clear to auscultation       Cardiovascular hypertension, Pt. on medications  Rhythm:Regular Rate:Normal     Neuro/Psych negative neurological ROS  negative psych ROS   GI/Hepatic negative GI ROS, Neg liver ROS,   Endo/Other   Obesity   Renal/GU negative Renal ROS     Musculoskeletal negative musculoskeletal ROS (+)   Abdominal (+) + obese,  Abdomen: tender.    Peds  Hematology negative hematology ROS (+)   Anesthesia Other Findings HSV  Reproductive/Obstetrics                           Anesthesia Physical Anesthesia Plan  ASA: II  Anesthesia Plan: General   Post-op Pain Management:    Induction: Intravenous  PONV Risk Score and Plan: 4 or greater and Treatment may vary due to age or medical condition, Ondansetron, Scopolamine patch - Pre-op, Midazolam, Dexamethasone and TIVA  Airway Management Planned: Oral ETT  Additional Equipment: None  Intra-op Plan:   Post-operative Plan: Extubation in OR  Informed Consent: I have reviewed the patients History and Physical, chart, labs and discussed the procedure including the risks, benefits and alternatives for the proposed anesthesia with the patient or authorized representative who has indicated his/her understanding and acceptance.     Dental advisory given  Plan Discussed with: CRNA and Anesthesiologist  Anesthesia Plan Comments:       Anesthesia Quick Evaluation

## 2018-09-11 NOTE — ED Notes (Signed)
Urine and culture sent to lab  

## 2018-09-11 NOTE — Discharge Instructions (Signed)
LAPAROSCOPIC SURGERY: POST OP INSTRUCTIONS ° °###################################################################### ° °EAT °Gradually transition to a high fiber diet with a fiber supplement over the next few weeks after discharge.  Start with a pureed / full liquid diet (see below) ° °WALK °Walk an hour a day.  Control your pain to do that.   ° °CONTROL PAIN °Control pain so that you can walk, sleep, tolerate sneezing/coughing, go up/down stairs. ° °HAVE A BOWEL MOVEMENT DAILY °Keep your bowels regular to avoid problems.  OK to try a laxative to override constipation.  OK to use an antidairrheal to slow down diarrhea.  Call if not better after 2 tries ° °CALL IF YOU HAVE PROBLEMS/CONCERNS °Call if you are still struggling despite following these instructions. °Call if you have concerns not answered by these instructions ° °###################################################################### ° ° ° °1. DIET: Follow a light bland diet the first 24 hours after arrival home, such as soup, liquids, crackers, etc.  Be sure to include lots of fluids daily.  Avoid fast food or heavy meals as your are more likely to get nauseated.  Eat a low fat the next few days after surgery.   ° °2. Take your usually prescribed home medications unless otherwise directed. ° °3. PAIN CONTROL: °a. Pain is best controlled by a usual combination of three different methods TOGETHER: °i. Ice/Heat °ii. Over the counter pain medication °iii. Prescription pain medication °b. Most patients will experience some swelling and bruising around the incisions.  Ice packs or heating pads (30-60 minutes up to 6 times a day) will help. Use ice for the first few days to help decrease swelling and bruising, then switch to heat to help relax tight/sore spots and speed recovery.  Some people prefer to use ice alone, heat alone, alternating between ice & heat.  Experiment to what works for you.  Swelling and bruising can take several weeks to resolve.   °c. It  is helpful to take an over-the-counter pain medication regularly for the first few weeks.  Choose one of the following that works best for you: °i. Naproxen (Aleve, etc)  Two 220mg tabs twice a day °ii. Ibuprofen (Advil, etc) Three 200mg tabs four times a day (every meal & bedtime) °iii. Acetaminophen (Tylenol, etc) 500-650mg four times a day (every meal & bedtime) °d. A  prescription for pain medication (such as oxycodone, hydrocodone, tramadol, gabapentin, methocarbamol, etc) should be given to you upon discharge.  Take your pain medication as prescribed.  °i. If you are having problems/concerns with the prescription medicine (does not control pain, nausea, vomiting, rash, itching, etc), please call us [phone] to see if we need to switch you to a different pain medicine that will work better for you and/or control your side effect better. °ii. If you need a refill on your pain medication, please give us 48 hour notice.  contact your pharmacy.  They will contact our office to request authorization. Prescriptions will not be filled after 5 pm or on week-ends ° °4. Avoid getting constipated.   °a. Between the surgery and the pain medications, it is common to experience some constipation.   °b. Increasing fluid intake and taking a fiber supplement (such as Metamucil, Citrucel, FiberCon, MiraLax, etc) 1-2 times a day regularly will usually help prevent this problem from occurring.   °c. A mild laxative (prune juice, Milk of Magnesia, MiraLax, etc) should be taken according to package directions if there are no bowel movements after 48 hours.   °5. Watch out for   diarrhea.   °a. If you have many loose bowel movements, simplify your diet to bland foods & liquids for a few days.   °b. Stop any stool softeners and decrease your fiber supplement.   °c. Switching to mild anti-diarrheal medications (Kayopectate, Pepto Bismol) can help.   °d. If this worsens or does not improve, please call us. ° °6. Wash / shower every  day.  You may shower over the dressings as they are waterproof.  Continue to shower over incision(s) after the dressing is off. ° °7. Remove your waterproof bandages 5 days after surgery.  You may leave the incision open to air.  You may replace a dressing/Band-Aid to cover the incision for comfort if you wish.  ° °8. ACTIVITIES as tolerated:   °a. You may resume regular (light) daily activities beginning the next day--such as daily self-care, walking, climbing stairs--gradually increasing activities as tolerated.  If you can walk 30 minutes without difficulty, it is safe to try more intense activity such as jogging, treadmill, bicycling, low-impact aerobics, swimming, etc. °b. Save the most intensive and strenuous activity for last such as sit-ups, heavy lifting, contact sports, etc  Refrain from any heavy lifting or straining until you are off narcotics for pain control.   °c. DO NOT PUSH THROUGH PAIN.  Let pain be your guide: If it hurts to do something, don't do it.  Pain is your body warning you to avoid that activity for another week until the pain goes down. °d. You may drive when you are no longer taking prescription pain medication, you can comfortably wear a seatbelt, and you can safely maneuver your car and apply brakes. °e. You may have sexual intercourse when it is comfortable. ° °9. FOLLOW UP in our office °a. Please call [facility] at [phone] to set up an appointment to see your surgeon in the office for a follow-up appointment approximately 2-3 weeks after your surgery. °b. Make sure that you call for this appointment the day you arrive home to insure a convenient appointment time. ° °10. IF YOU HAVE DISABILITY OR FAMILY LEAVE FORMS, BRING THEM TO THE OFFICE FOR PROCESSING.  DO NOT GIVE THEM TO YOUR DOCTOR. ° ° °WHEN TO CALL US [phone]: °1. Poor pain control °2. Reactions / problems with new medications (rash/itching, nausea, etc)  °3. Fever over 101.5 F (38.5 C) °4. Inability to  urinate °5. Nausea and/or vomiting °6. Worsening swelling or bruising °7. Continued bleeding from incision. °8. Increased pain, redness, or drainage from the incision ° ° The clinic staff is available to answer your questions during regular business hours (8:30am-5pm).  Please don’t hesitate to call and ask to speak to one of our nurses for clinical concerns.  ° If you have a medical emergency, go to the nearest emergency room or call 911. ° A surgeon from [facility] is always on call at the hospitals ° ° °[facility] °[address], [address], [place], [place]  [address] ? °MAIN: [phone] ? TOLL FREE: [phone] ?  °FAX [phone] °[email or web address] ° ° ° ° ° °Cholecystitis ° °Cholecystitis is inflammation of the gallbladder. It is often called a gallbladder attack. The gallbladder is a pear-shaped organ that lies beneath the liver on the right side of the body. The gallbladder stores bile, which is a fluid that helps the body digest fats. If bile builds up in your gallbladder, your gallbladder becomes inflamed. °This condition may occur suddenly. Cholecystitis is   a serious condition and requires treatment. °What are the causes? °The most common cause of this condition is gallstones. Gallstones can block the tube (duct) that carries bile out of your gallbladder. This causes bile to build up. °Other causes include: °· Damage to the gallbladder due to a decrease in blood flow. °· Infections in the bile ducts. °· Scars or kinks in the bile ducts. °· Tumors in the liver, pancreas, or gallbladder. °What increases the risk? °You are more likely to develop this condition if: °· You have sickle cell disease. °· You take birth control pills or use estrogen. °· You have alcoholic liver disease. °· You have liver cirrhosis. °· You have your nutrition delivered through a vein (parenteral nutrition). °· You are critically ill. °· You do not eat or drink for a long  time. This is also called "fasting." °· You are obese. °· You lose weight too fast. °· You are pregnant. °· You have high levels of fat (triglycerides) in the blood. °· You have pancreatitis. °What are the signs or symptoms? °Symptoms of this condition include: °· Pain in the abdomen, especially in the upper right area of the abdomen. °· Tenderness or bloating in the abdomen. °· Nausea. °· Vomiting. °· Fever. °· Chills. °How is this diagnosed? °This condition is diagnosed with a medical history and physical exam. You may also have other tests, including: °· Imaging tests, such as: °? An ultrasound of the gallbladder. °? A CT scan of the abdomen. °? A gallbladder nuclear scan (HIDA scan). This scan allows your health care provider to see the bile moving from your liver to your gallbladder and on to your small intestine. °? MRI. °· Blood tests, such as: °? A complete blood count. The white blood cell count may be higher than normal. °? Liver function tests. Certain types of gallstones cause some results to be higher than normal. °How is this treated? °Treatment may include: °· Surgery to remove your gallbladder (cholecystectomy). °· Antibiotic medicine, usually through an IV. °· Fasting for a certain amount of time. °· Giving IV fluids. °· Medicine to treat pain or vomiting. °Follow these instructions at home: °· If you had surgery, follow instructions from your health care provider about home care after the procedure. °Medicines ° °· Take over-the-counter and prescription medicines only as told by your health care provider. °· If you were prescribed an antibiotic medicine, take it as told by your health care provider. Do not stop taking the antibiotic even if you start to feel better. °General instructions °· Follow instructions from your health care provider about what to eat or drink. When you are allowed to eat, avoid eating or drinking anything that triggers your symptoms. °· Do not lift anything that is heavier  than 10 lb (4.5 kg), or the limit that you are told, until your health care provider says that it is safe. °· Do not use any products that contain nicotine or tobacco, such as cigarettes and e-cigarettes. If you need help quitting, ask your health care provider. °· Keep all follow-up visits as told by your health care provider. This is important. °Contact a health care provider if: °· Your pain is not controlled with medicine. °· You have a fever. °Get help right away if: °· Your pain moves to another part of your abdomen or to your back. °· You continue to have symptoms or you develop new symptoms even with treatment. °Summary °· Cholecystitis is inflammation of the gallbladder. °· The most   common cause of this condition is gallstones. Gallstones can block the tube (duct) that carries bile out of your gallbladder. °· Common symptoms are pain in the abdomen, nausea, vomiting, fever, and chills. °· This condition is treated with surgery to remove the gallbladder, medicines, fasting, and IV fluids. °· Follow your health care provider's instructions for eating and drinking. Avoid eating anything that triggers your symptoms. °This information is not intended to replace advice given to you by your health care provider. Make sure you discuss any questions you have with your health care provider. °Document Released: 07/22/2005 Document Revised: 11/28/2017 Document Reviewed: 11/28/2017 °Elsevier Interactive Patient Education © 2019 [employer] ° °

## 2018-09-11 NOTE — Transfer of Care (Signed)
Immediate Anesthesia Transfer of Care Note  Patient: [name]  Procedure(s) Performed: LAPAROSCOPIC CHOLECYSTECTOMY SINGLE SITE WITH INTRAOPERATIVE CHOLANGIOGRAM (N/A )  Patient Location: PACU  Anesthesia Type:General  Level of Consciousness: awake, drowsy and responds to stimulation  Airway & Oxygen Therapy: Patient Spontanous Breathing and Patient connected to face mask oxygen  Post-op Assessment: Report given to RN and Post -op Vital signs reviewed and stable  Post vital signs: Reviewed and stable  Last Vitals:  Vitals Value Taken Time  BP 143/75 09/11/2018  6:11 PM  Temp    Pulse 102 09/11/2018  6:13 PM  Resp 25 09/11/2018  6:13 PM  SpO2 100 % 09/11/2018  6:13 PM  Vitals shown include unvalidated device data.  Last Pain:  Vitals:   09/11/18 1300  TempSrc:   PainSc: 0-No pain         Complications: No apparent anesthesia complications

## 2018-09-11 NOTE — Op Note (Signed)
09/11/2018  PATIENT:  [name]  43 y.o. female  Patient Care Team: [name], Sylvester as PCP - General (General Practice)  PRE-OPERATIVE DIAGNOSIS:    Acute on Chronic Calculus Cholecystitis  POST-OPERATIVE DIAGNOSIS:   Acute on Chronic Calculus Cholecystitis  Liver: normal  PROCEDURE:  SINGLE SITE Laparoscopic cholecystectomy with intraoperative cholangiogram  SURGEON:  Adin Hector, MD, FACS.  ASSISTANT: OR Staff   ANESTHESIA:    General with endotracheal intubation Local anesthetic as a field block  EBL:  (See Anesthesia Intraoperative Record) Total I/O In: 0  Out: 10 [Blood:10]  Delay start of Pharmacological VTE agent (>24hrs) due to surgical blood loss or risk of bleeding:  no  DRAINS: None   SPECIMEN: Gallbladder    DISPOSITION OF SPECIMEN:  PATHOLOGY  COUNTS:  YES  PLAN OF CARE: Admit for overnight observation  PATIENT DISPOSITION:  PACU - hemodynamically stable.  INDICATION: Pleasant woman with abdominal pain chills and nausea.  Triggered by eating.  Had recurrent attack.  Ultrasound showed sludge in the gallbladder with suspected stones.  Probable early cholecystitis.  Rest of the differential diagnosis seems unlikely.  We offered cholecystectomy  The anatomy & physiology of hepatobiliary & pancreatic function was discussed.  The pathophysiology of gallbladder dysfunction was discussed.  Natural history risks without surgery was discussed.   I feel the risks of no intervention will lead to serious problems that outweigh the operative risks; therefore, I recommended cholecystectomy to remove the pathology.  I explained laparoscopic techniques with possible need for an open approach.  Probable cholangiogram to evaluate the bilary tract was explained as well.    Risks such as bleeding, infection, abscess, leak, injury to other organs, need for further treatment, heart attack, death, and other risks were discussed.  I noted a good likelihood this will  help address the problem.  Possibility that this will not correct all abdominal symptoms was explained.  Goals of post-operative recovery were discussed as well.  We will work to minimize complications.  An educational handout further explaining the pathology and treatment options was given as well.  Questions were answered.  The patient expresses understanding & wishes to proceed with surgery.  OR FINDINGS: Enlarged tender gallbladder with edema consistent with acute cholecystitis.  Chronic white discoloration and adhesions suspicious for chronic cholecystitis.  Numerous stones within the gallbladder itself.  Cholangiogram showing no common bile duct stone or obstruction.  Easy flow into the duodenum.  Liver: normal  DESCRIPTION:   The patient was identified & brought in the operating room. The patient was positioned supine with arms tucked. SCDs were active during the entire case. The patient underwent general anesthesia without any difficulty.  The abdomen was prepped and draped in a sterile fashion. A Surgical Timeout confirmed our plan.  I made a transverse curvilinear incision through the superior umbilical fold.  I placed a 78mm long port through the supraumbilical fascia using a modified Hassan cutdown technique with umbilical stalk fascial countertraction. I began carbon dioxide insufflation.  No change in end tidal CO2 measurement.   Camera inspection revealed no injury. There were no adhesions to the anterior abdominal wall supraumbilically.  I proceeded to continue with single site technique. I placed a #5 port in left upper aspect of the wound. I placed a 5 mm atraumatic grasper in the right inferior aspect of the wound.  I turned attention to the right upper quadrant.  Gallbladder was somewhat turgid enlarged but was able to be grasped and elevated  cephalad. I freed adhesions to the ventral surface of the gallbladder off carefully.  I freed the peritoneal coverings between the gallbladder  and the liver on the posteriolateral and anteriomedial walls. I alternated between Harmonic & blunt Maryland dissection to help get a good critical view of the cystic artery and cystic duct.  did further dissection to free 50%of the gallbladder off the liver bed to get a good critical view of the infundibulum and cystic duct. I dissected out the cystic artery; and, after getting a good 360 view, ligated the anterior & posterior branches of the cystic artery close on the infundibulum using the Harmonic ultrasonic dissection.  I skeletonized the cystic duct.  I placed a clip on the infundibulum. I did a partial cystic duct-otomy and ensured patency. I placed a 5 [place] cholangiocatheter through a puncture site at the right subcostal ridge of the abdominal wall and directed it into the cystic duct.  We ran a cholangiogram with dilute radio-opaque contrast and continuous fluoroscopy. Contrast flowed from a side branch consistent with cystic duct cannulization. Contrast flowed up the common hepatic duct into the right and left intrahepatic chains out to secondary radicals. Contrast flowed down the common bile duct easily across the normal ampulla into the duodenum.  This was consistent with a normal cholangiogram.  I removed the cholangiocatheter. I placed clips on the cystic duct x4.  I completed cystic duct transection. I freed the gallbladder from its remaining attachments to the liver. I ensured hemostasis on the gallbladder fossa of the liver and elsewhere.  I placed the gallbladder inside an eco-sac.  I inspected the rest of the abdomen & detected no injury nor bleeding elsewhere.  I removed the gallbladder out the supraumbilical fascia.  Had open up 2 cm to get the gallbladder with its numerous stones and wall thickening out.   I closed the fascia transversely using #1 PDS interrupted stitches. I closed the skin using 4-0 monocryl stitch.  Sterile dressing was applied. The patient was extubated & arrived in  the PACU in stable condition..  I had discussed postoperative care with the patient in the holding area. I discussed operative findings, updated the patient's status, discussed probable steps to recovery, and gave postoperative recommendations to the patient's family.  Recommendations were made.  Questions were answered.  They expressed understanding & appreciation.  Adin Hector, M.D., F.A.C.S. Gastrointestinal and Minimally Invasive Surgery [facility] [address], [place], [place] [address] [phone] Main / Paging  09/11/2018 5:57 PM

## 2018-09-11 NOTE — ED Notes (Signed)
ED TO INPATIENT HANDOFF REPORT  Name/Age/Gender [name] 43 y.o. female  Code Status    Code Status Orders  (From admission, onward)         Start     Ordered   09/11/18 1121  Full code  Continuous     09/11/18 1120        Code Status History    Date Active Date Inactive Code Status Order ID Comments User Context   07/20/2015 1230 07/22/2015 1312 Full Code [id number]  Arvella Nigh, MD Inpatient      Home/SNF/Other Home  Chief Complaint Abdominal Pain   Level of Care/Admitting Diagnosis ED Disposition    ED Disposition Condition [facility] Area: [facility] [[id number]]  Level of Care: Med-Surg [16]  Diagnosis: Acute cholecystitis [575.0.ICD-9-CM]  Admitting Physician: CCS, [place]  Attending Physician: CCS, [place]  Bed request comments: 5 west  PT Class (Do Not Modify): Observation [104]  PT Acc Code (Do Not Modify): Observation [10022]       Medical History Past Medical History:  Diagnosis Date  . Acute bronchitis 09/11/2018  . Acute dermatitis 09/11/2018  . Acute maxillary sinusitis 09/11/2018  . Acute urinary tract infection 09/11/2018  . Anemia   . 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 Hyperlipidemia    patient denies  . Hypertension   . Mass    on rt inner thigh  . Trichomonal vaginitis 09/11/2018    Allergies Allergies  Allergen Reactions  . Hydrocodone-Acetaminophen Nausea And Vomiting    IV Location/Drains/Wounds Patient Lines/Drains/Airways Status   Active Line/Drains/Airways    Name:   Placement date:   Placement time:   Site:   Days:   Peripheral IV 09/11/18 Left Antecubital   09/11/18    0654    Antecubital   less than 1   Incision (Closed) 07/20/15 Perineum   07/20/15    0755     1149   Incision (Closed) 07/20/15 Abdomen Other (Comment)   07/20/15    0755     1149          Labs/Imaging Results for orders placed or performed during the hospital encounter of 09/11/18 (from the past  48 hour(s))  CBC with Differential     Status: Abnormal   Collection Time: 09/11/18  6:40 AM  Result Value Ref Range   WBC 6.1 4.0 - 10.5 K/uL   RBC 4.31 3.87 - 5.11 MIL/uL   Hemoglobin 11.6 (L) 12.0 - 15.0 g/dL   HCT 36.2 36.0 - 46.0 %   MCV 84.0 80.0 - 100.0 fL   MCH 26.9 26.0 - 34.0 pg   MCHC 32.0 30.0 - 36.0 g/dL   RDW 13.6 11.5 - 15.5 %   Platelets 277 150 - 400 K/uL   nRBC 0.0 0.0 - 0.2 %   Neutrophils Relative % 43 %   Neutro Abs 2.6 1.7 - 7.7 K/uL   Lymphocytes Relative 46 %   Lymphs Abs 2.8 0.7 - 4.0 K/uL   Monocytes Relative 9 %   Monocytes Absolute 0.5 0.1 - 1.0 K/uL   Eosinophils Relative 1 %   Eosinophils Absolute 0.1 0.0 - 0.5 K/uL   Basophils Relative 0 %   Basophils Absolute 0.0 0.0 - 0.1 K/uL   WBC Morphology MORPHOLOGY UNREMARKABLE    Immature Granulocytes 1 %   Abs Immature Granulocytes 0.03 0.00 - 0.07 K/uL    Comment: Performed at [facility], [address] [place], [place] [address]  Comprehensive metabolic panel     Status: Abnormal   Collection Time: 09/11/18  6:40 AM  Result Value Ref Range   Sodium 138 135 - 145 mmol/L   Potassium 3.4 (L) 3.5 - 5.1 mmol/L   Chloride 105 98 - 111 mmol/L   CO2 28 22 - 32 mmol/L   Glucose, Bld 104 (H) 70 - 99 mg/dL   BUN 10 6 - 20 mg/dL   Creatinine, Ser 0.74 0.44 - 1.00 mg/dL   Calcium 8.6 (L) 8.9 - 10.3 mg/dL   Total Protein 7.5 6.5 - 8.1 g/dL   Albumin 3.7 3.5 - 5.0 g/dL   AST 18 15 - 41 U/L   ALT 25 0 - 44 U/L   Alkaline Phosphatase 67 38 - 126 U/L   Total Bilirubin 0.1 (L) 0.3 - 1.2 mg/dL   GFR calc non Af Amer >60 >60 mL/min   GFR calc Af Amer >60 >60 mL/min   Anion gap 5 5 - 15    Comment: Performed at [facility], [place] [address], [place], [place] [address]  Lipase, blood     Status: None   Collection Time: 09/11/18  6:40 AM  Result Value Ref Range   Lipase 34 11 - 51 U/L    Comment: Performed at [facility], [place] [address],  [place], [place] [address]  Urinalysis, Routine w reflex microscopic     Status: Abnormal   Collection Time: 09/11/18  6:40 AM  Result Value Ref Range   Color, Urine YELLOW YELLOW   APPearance HAZY (A) CLEAR   Specific Gravity, Urine 1.027 1.005 - 1.030   pH 7.0 5.0 - 8.0   Glucose, UA NEGATIVE NEGATIVE mg/dL   Hgb urine dipstick NEGATIVE NEGATIVE   Bilirubin Urine NEGATIVE NEGATIVE   Ketones, ur NEGATIVE NEGATIVE mg/dL   Protein, ur 30 (A) NEGATIVE mg/dL   Nitrite NEGATIVE NEGATIVE   Leukocytes, UA MODERATE (A) NEGATIVE   RBC / HPF 0-5 0 - 5 RBC/hpf   WBC, UA 6-10 0 - 5 WBC/hpf   Bacteria, UA RARE (A) NONE SEEN   Squamous Epithelial / LPF 11-20 0 - 5   Mucus PRESENT     Comment: Performed at [facility], [place] [address] [place], [place] [address]   US Abdomen Limited Ruq  Result Date: 09/11/2018 CLINICAL DATA:  Epigastric pain for several days EXAM: ULTRASOUND ABDOMEN LIMITED RIGHT UPPER QUADRANT COMPARISON:  None. FINDINGS: Gallbladder: Within the gallbladder, there are echogenic foci which move and shadow consistent with cholelithiasis. Largest gallstone measures 8 mm. There is also sludge in the gallbladder. There is a suspected calculus adherent in the neck of the gallbladder. The gallbladder wall is upper normal in thickness with questionable subtle wall edema. No pericholecystic fluid. No sonographic Murphy sign noted by sonographer. Common bile duct: Diameter: 6 mm. No intrahepatic or extrahepatic biliary duct dilatation. Liver: No focal lesion identified. Within normal limits in parenchymal echogenicity. Portal vein is patent on color Doppler imaging with normal direction of blood flow towards the liver. IMPRESSION: Cholelithiasis with sludge in the gallbladder. Suspect gallstone adherent in the neck of the gallbladder. The gallbladder wall is upper normal in thickness with subtle gallbladder wall edema. These are findings that may be indicative of early cholecystitis.  In this regard, it may be prudent to consider nuclear medicine hepatobiliary imaging study to assess for cystic duct patency. Study otherwise unremarkable. Electronically Signed  By: Lowella Grip III M.D.   On: 09/11/2018 09:58   None  Pending Labs Unresulted Labs (From admission, onward)    Start     Ordered   09/18/18 0500  Creatinine, serum  (enoxaparin (LOVENOX)    CrCl >/= 30 ml/min)  Weekly,   R    Comments:  while on enoxaparin therapy    09/11/18 1120   09/11/18 0729  Urine culture  Add-on,   STAT     09/11/18 0729          Vitals/Pain Today's Vitals   09/11/18 0804 09/11/18 0807 09/11/18 1015 09/11/18 1134  BP: 130/72  123/72 113/71  Pulse: 71  71 76  Resp: 19  18 17   Temp:      TempSrc:      SpO2: 97%  97% 97%  Weight:      Height:      PainSc:  0-No pain      Isolation Precautions No active isolations  Medications Medications  multivitamins with iron tablet 1 tablet (has no administration in time range)  olmesartan-hydrochlorothiazide (BENICAR HCT) 40-25 MG per tablet 1 tablet (has no administration in time range)  enoxaparin (LOVENOX) injection 40 mg (has no administration in time range)  0.45 % NaCl with KCl 20 mEq / L infusion (has no administration in time range)  cefTRIAXone (ROCEPHIN) 2 g in sodium chloride 0.9 % 100 mL IVPB (has no administration in time range)  metoprolol tartrate (LOPRESSOR) injection 5 mg (has no administration in time range)  calcium carbonate (TUMS - dosed in mg elemental calcium) chewable tablet 200 mg of elemental calcium (has no administration in time range)  simethicone (MYLICON) chewable tablet 40 mg (has no administration in time range)  ondansetron (ZOFRAN-ODT) disintegrating tablet 4 mg (has no administration in time range)    Or  ondansetron (ZOFRAN) injection 4 mg (has no administration in time range)  docusate sodium (COLACE) capsule 100 mg (has no administration in time range)  polyethylene glycol (MIRALAX /  GLYCOLAX) packet 17 g (has no administration in time range)  diphenhydrAMINE (BENADRYL) capsule 25 mg (has no administration in time range)    Or  diphenhydrAMINE (BENADRYL) injection 25 mg (has no administration in time range)  methocarbamol (ROBAXIN) tablet 500 mg (has no administration in time range)  acetaminophen (TYLENOL) tablet 650 mg (has no administration in time range)    Or  acetaminophen (TYLENOL) suppository 650 mg (has no administration in time range)  morphine 2 MG/ML injection 1 mg (has no administration in time range)  oxyCODONE (Oxy IR/ROXICODONE) immediate release tablet 5 mg (has no administration in time range)  morphine 4 MG/ML injection 4 mg (4 mg Intravenous Given 09/11/18 0700)  ondansetron (ZOFRAN) injection 4 mg (4 mg Intravenous Given 09/11/18 0656)  lidocaine (XYLOCAINE) 2 % viscous mouth solution 15 mL (15 mLs Oral Given 09/11/18 0701)    Mobility walks

## 2018-09-11 NOTE — H&P (Signed)
[facility] Admission Note  [name] 21-Jun-1976  [id number].    Requesting MD: Merrily Pew Chief Complaint/Reason for Consult: cholecystitis  HPI:  [name] is a 43yo female who presented to [facility] earlier this morning with worsening abdominal pain. States that she had some epigastric discomfort, chills, and nausea on Monday. Thought she was just constipated so she took some baking soda which helped the constipation, but her abdominal pain never fully resolved.  Associated symptoms include bloating, nausea, and suppressed appetite. Pain is worse after eating. States that she woke up in the middle of the night with severe epigastric pain so she decided to come to the ED.  ED workup included u/s which shows cholelithiasis with sludge in the gallbladder, suspect gallstone adherent in the neck of the gallbladder, gallbladder wall is upper normal in thickness with subtle gallbladder wall edema, concerning for early acute cholecystitis.  WBC 6.1, lipase and LFTs WNL.  General surgery asked to see.  PMH significant for HTN Abdominal surgical history: c section, myomectomy 2016 Anticoagulants: none Nonsmoker Employment: call center for [employer]  ROS: Review of Systems  Constitutional: Positive for chills.  HENT: Negative.   Eyes: Negative.   Respiratory: Negative.   Cardiovascular: Negative.   Gastrointestinal: Positive for abdominal pain, constipation and nausea. Negative for vomiting.  Genitourinary: Negative.   Musculoskeletal: Negative.   Skin: Negative.   Neurological: Negative.    All systems reviewed and otherwise negative except for as above  Family History  Problem Relation Age of Onset  . COPD Mother     Past Medical History:  Diagnosis Date  . Acute bronchitis 09/11/2018  . Acute dermatitis 09/11/2018  . Acute maxillary sinusitis 09/11/2018  . Acute urinary tract infection 09/11/2018  . Anemia   . 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  Hyperlipidemia    patient denies  . Hypertension   . Mass    on rt inner thigh  . Trichomonal vaginitis 09/11/2018    Past Surgical History:  Procedure Laterality Date  . BREAST EXCISIONAL BIOPSY Left 2003   papiloma removed  . BREAST SURGERY     left breast papilloma  . CESAREAN SECTION  07/11/03  . MASS EXCISION  05/21/05   inner lt thigh  . MYOMECTOMY N/A 07/20/2015   Procedure: MYOMECTOMY, ABDOMINAL;  Surgeon: Arvella Nigh, MD;  Location: [facility];  Service: Gynecology;  Laterality: N/A;  . WISDOM TOOTH EXTRACTION      Social History:  reports that she has never smoked. She has never used smokeless tobacco. She reports that she does not drink alcohol or use drugs.  Allergies:  Allergies  Allergen Reactions  . Hydrocodone-Acetaminophen Nausea And Vomiting    (Not in a hospital admission)   Prior to Admission medications   Medication Sig Start Date End Date Taking? Authorizing Provider  Multiple Vitamins-Iron (MULTIVITAMINS WITH IRON) TABS tablet Take 1 tablet by mouth daily.   Yes [provider]  NUVARING 0.12-0.015 MG/24HR vaginal ring Place 1 application vaginally every 30 (thirty) days. 03/23/18  Yes [provider]  olmesartan-hydrochlorothiazide (BENICAR HCT) 40-25 MG tablet Take 1 tablet by mouth daily. 03/31/18  Yes [provider]  amoxicillin (AMOXIL) 875 MG tablet Take 1 tablet (875 mg total) by mouth 2 (two) times daily. Patient not taking: Reported on 09/11/2018 08/27/18   [name], FNP  fluconazole (DIFLUCAN) 150 MG tablet Take at onset of symptoms may repeat in 5 days. Patient not taking: Reported on 09/11/2018 08/27/18  [name], FNP    Blood pressure 123/72, pulse 71, temperature 99 F (37.2 C), temperature source Oral, resp. rate 18, height 5\' 4"  (1.626 m), weight 97.5 kg, SpO2 97 %. Physical Exam: General: pleasant, WD/WN AA female who is laying in bed in NAD HEENT: head is normocephalic, atraumatic.  Sclera are  noninjected.  Pupils equal and round.  Ears and nose without any masses or lesions.  Mouth is pink and moist. Dentition fair Heart: regular, rate, and rhythm.  No obvious murmurs, gallops, or rubs noted.  Palpable pedal pulses bilaterally Lungs: CTAB, no wheezes, rhonchi, or rales noted.  Respiratory effort nonlabored Abd: well healed lower transverse incision, soft, ND, +BS, no masses, hernias, or organomegaly. TTP epigastric region and RUQ without rebound or guarding MS: all 4 extremities are symmetrical with no cyanosis, clubbing, or edema. Skin: warm and dry with no masses, lesions, or rashes Psych: A&Ox3 with an appropriate affect. Neuro: cranial nerves grossly intact, extremity CSM intact bilaterally, normal speech  Results for orders placed or performed during the hospital encounter of 09/11/18 (from the past 48 hour(s))  CBC with Differential     Status: Abnormal   Collection Time: 09/11/18  6:40 AM  Result Value Ref Range   WBC 6.1 4.0 - 10.5 K/uL   RBC 4.31 3.87 - 5.11 MIL/uL   Hemoglobin 11.6 (L) 12.0 - 15.0 g/dL   HCT 36.2 36.0 - 46.0 %   MCV 84.0 80.0 - 100.0 fL   MCH 26.9 26.0 - 34.0 pg   MCHC 32.0 30.0 - 36.0 g/dL   RDW 13.6 11.5 - 15.5 %   Platelets 277 150 - 400 K/uL   nRBC 0.0 0.0 - 0.2 %   Neutrophils Relative % 43 %   Neutro Abs 2.6 1.7 - 7.7 K/uL   Lymphocytes Relative 46 %   Lymphs Abs 2.8 0.7 - 4.0 K/uL   Monocytes Relative 9 %   Monocytes Absolute 0.5 0.1 - 1.0 K/uL   Eosinophils Relative 1 %   Eosinophils Absolute 0.1 0.0 - 0.5 K/uL   Basophils Relative 0 %   Basophils Absolute 0.0 0.0 - 0.1 K/uL   WBC Morphology MORPHOLOGY UNREMARKABLE    Immature Granulocytes 1 %   Abs Immature Granulocytes 0.03 0.00 - 0.07 K/uL    Comment: Performed at [facility], [place] [address], [place], [place] [address]  Comprehensive metabolic panel     Status: Abnormal   Collection Time: 09/11/18  6:40 AM  Result Value Ref Range   Sodium 138 135 - 145  mmol/L   Potassium 3.4 (L) 3.5 - 5.1 mmol/L   Chloride 105 98 - 111 mmol/L   CO2 28 22 - 32 mmol/L   Glucose, Bld 104 (H) 70 - 99 mg/dL   BUN 10 6 - 20 mg/dL   Creatinine, Ser 0.74 0.44 - 1.00 mg/dL   Calcium 8.6 (L) 8.9 - 10.3 mg/dL   Total Protein 7.5 6.5 - 8.1 g/dL   Albumin 3.7 3.5 - 5.0 g/dL   AST 18 15 - 41 U/L   ALT 25 0 - 44 U/L   Alkaline Phosphatase 67 38 - 126 U/L   Total Bilirubin 0.1 (L) 0.3 - 1.2 mg/dL   GFR calc non Af Amer >60 >60 mL/min   GFR calc Af Amer >60 >60 mL/min   Anion gap 5 5 - 15    Comment: Performed at [facility], [place] [address], [place], [place] [address]  Lipase,  blood     Status: None   Collection Time: 09/11/18  6:40 AM  Result Value Ref Range   Lipase 34 11 - 51 U/L    Comment: Performed at [facility], [place] [address], [place], [place] [address]  Urinalysis, Routine w reflex microscopic     Status: Abnormal   Collection Time: 09/11/18  6:40 AM  Result Value Ref Range   Color, Urine YELLOW YELLOW   APPearance HAZY (A) CLEAR   Specific Gravity, Urine 1.027 1.005 - 1.030   pH 7.0 5.0 - 8.0   Glucose, UA NEGATIVE NEGATIVE mg/dL   Hgb urine dipstick NEGATIVE NEGATIVE   Bilirubin Urine NEGATIVE NEGATIVE   Ketones, ur NEGATIVE NEGATIVE mg/dL   Protein, ur 30 (A) NEGATIVE mg/dL   Nitrite NEGATIVE NEGATIVE   Leukocytes, UA MODERATE (A) NEGATIVE   RBC / HPF 0-5 0 - 5 RBC/hpf   WBC, UA 6-10 0 - 5 WBC/hpf   Bacteria, UA RARE (A) NONE SEEN   Squamous Epithelial / LPF 11-20 0 - 5   Mucus PRESENT     Comment: Performed at [facility], [place] [address] [place], [place] [address]   US Abdomen Limited Ruq  Result Date: 09/11/2018 CLINICAL DATA:  Epigastric pain for several days EXAM: ULTRASOUND ABDOMEN LIMITED RIGHT UPPER QUADRANT COMPARISON:  None. FINDINGS: Gallbladder: Within the gallbladder, there are echogenic foci which move and shadow consistent with cholelithiasis. Largest gallstone  measures 8 mm. There is also sludge in the gallbladder. There is a suspected calculus adherent in the neck of the gallbladder. The gallbladder wall is upper normal in thickness with questionable subtle wall edema. No pericholecystic fluid. No sonographic Murphy sign noted by sonographer. Common bile duct: Diameter: 6 mm. No intrahepatic or extrahepatic biliary duct dilatation. Liver: No focal lesion identified. Within normal limits in parenchymal echogenicity. Portal vein is patent on color Doppler imaging with normal direction of blood flow towards the liver. IMPRESSION: Cholelithiasis with sludge in the gallbladder. Suspect gallstone adherent in the neck of the gallbladder. The gallbladder wall is upper normal in thickness with subtle gallbladder wall edema. These are findings that may be indicative of early cholecystitis. In this regard, it may be prudent to consider nuclear medicine hepatobiliary imaging study to assess for cystic duct patency. Study otherwise unremarkable. Electronically Signed   By: Lowella Grip III M.D.   On: 09/11/2018 09:58      Assessment/Plan HTN - home med  Acute cholecystitis - U/s consistent with early acute cholecystitis, and patient is tender epigastric region/RUQ. Recommend admission for laparoscopic cholecystectomy. Hopefully will be able to do her surgery today. Start IV abx and keep NPO.   Wellington Hampshire, [facility] Surgery 09/11/2018, 11:00 AM Pager: [phone] Mon-Thurs 7:00 am-4:30 pm Fri 7:00 am -11:30 AM Sat-Sun 7:00 am-11:30 am

## 2018-09-11 NOTE — ED Triage Notes (Signed)
Pt reports epigastric abdominal pain and bloating since Monday. Her last BM was Tuesday and she states that it was watery. Pt has had no appetite. She reports that she has only been able to pass a small amount of gas. Denies any medical hx.

## 2018-09-12 ENCOUNTER — Encounter (HOSPITAL_COMMUNITY): Payer: Self-pay | Admitting: Surgery

## 2018-09-12 LAB — URINE CULTURE: Culture: >=100000 — AB

## 2018-09-12 MED ORDER — IBUPROFEN 200 MG PO TABS: 400.0000 mg | ORAL_TABLET | Freq: Four times a day (QID) | ORAL | Status: DC | PRN

## 2018-09-12 NOTE — Discharge Summary (Signed)
Physician Discharge Summary [facility]  Patient ID: [name] MRN: [id number] DOB/AGE: 09-17-1975 43 y.o.  Admit date: 09/11/2018 Discharge date: 09/12/2018  Admission Diagnoses:  Cholecystitis, cholelithiasis  Discharge Diagnoses:  Principal Problem:   Acute calculous cholecystitis Active Problems:   Class 2 obesity due to excess calories without serious comorbidity with body mass index (BMI) of 35.0 to 35.9 in adult   Acute cholecystitis   Discharged Condition: good  Hospital Course: Patient was admitted for observation following gallbladder surgery.  Post op course was uncomplicated.  Pain was well controlled.  Tolerated diet.  Patient was prepared for discharge home on POD#1.  Consults: None  Treatments: surgery: lap single site cholecystectomy  Discharge Exam: Blood pressure 128/68, pulse 81, temperature 98.2 F (36.8 C), temperature source Oral, resp. rate 20, height 5\' 4"  (1.626 m), weight 97.5 kg, SpO2 97 %. HEENT - clear Neck - soft Chest - clear bilaterally Cor - RRR Abd - soft, obese; dressing dry and intact at umbilicus; mild tenderness RUQ and peri-umbilical  Disposition: Home  Discharge Instructions    Call MD for:   Complete by:  As directed    FEVER > 101.5 F  (temperatures < 101.5 F are not significant)   Call MD for:  extreme fatigue   Complete by:  As directed    Call MD for:  persistant dizziness or light-headedness   Complete by:  As directed    Call MD for:  persistant nausea and vomiting   Complete by:  As directed    Call MD for:  redness, tenderness, or signs of infection (pain, swelling, redness, odor or green/yellow discharge around incision site)   Complete by:  As directed    Call MD for:  severe uncontrolled pain   Complete by:  As directed    Diet - low sodium heart healthy   Complete by:  As directed    Start with a bland diet such as soups, liquids, starchy foods, low fat foods, etc. the first few days  at home. Gradually advance to a solid, low-fat, high fiber diet by the end of the first week at home.   Add a fiber supplement to your diet (Metamucil, etc) If you feel full, bloated, or constipated, stay on a full liquid or pureed/blenderized diet for a few days until you feel better and are no longer constipated.   Diet - low sodium heart healthy   Complete by:  As directed    Discharge instructions   Complete by:  As directed    See Discharge Instructions If you are not getting better after two weeks or are noticing you are getting worse, contact our office [phone] for further advice.  We may need to adjust your medications, re-evaluate you in the office, send you to the emergency room, or see what other things we can do to help. The clinic staff is available to answer your questions during regular business hours (8:30am-5pm).  Please don't hesitate to call and ask to speak to one of our nurses for clinical concerns.    A surgeon from [facility] is always on call at the hospitals 24 hours/day If you have a medical emergency, go to the nearest emergency room or call 911.   Discharge instructions   Complete by:  As directed    [facility]  LAPAROSCOPIC SURGERY:  POST-OP INSTRUCTIONS  Always review your discharge instruction sheet given to you by the facility  where your surgery was performed.  A prescription for pain medication may be given to you upon discharge.  Take your pain medication as prescribed.  If narcotic pain medicine is not needed, then you may take acetaminophen (Tylenol) or ibuprofen (Advil) as needed.  Take your usually prescribed medications unless otherwise directed.  If you need a refill on your pain medication, please contact your pharmacy.  They will contact our office to request authorization. Prescriptions will not be filled after 5 P.M. or on weekends.  You should follow a light diet the first few days after arrival home,  such as soup and crackers or toast.  Be sure to include plenty of fluids daily.  Most patients will experience some swelling and bruising in the area of the incisions.  Ice packs will help.  Swelling and bruising can take several days to resolve.   It is common to experience some constipation after surgery.  Increasing fluid intake and taking a stool softener (such as Colace) will usually help or prevent this problem from occurring.  A mild laxative (Milk of Magnesia or Miralax) should be taken according to package instructions if there has been no bowel movement after 48 hours.  You will have steri-strips and a gauze dressing over your incisions.  You may remove the gauze bandage on the second day after surgery, and you may shower at that time.  Leave your steri-strips (small skin tapes) in place directly over the incision.  These strips should remain on the skin for 5-7 days and then be removed.  You may get them wet in the shower and pat them dry.  Any sutures or staples will be removed at the office during your follow-up visit.  ACTIVITIES:  You may resume regular (light) daily activities beginning the next day - such as daily self-care, walking, climbing stairs - gradually increasing activities as tolerated.  You may have sexual intercourse when it is comfortable.  Refrain from any heavy lifting or straining until approved by your doctor.  You may drive when you are no longer taking prescription pain medication, you can comfortably wear a seatbelt, and you can safely maneuver your car and apply brakes.  You should see your doctor in the office for a follow-up appointment approximately 2-3 weeks after your surgery.  Make sure that you call for this appointment within a day or two after you arrive home to insure a convenient appointment time.  WHEN TO CALL YOUR DOCTOR: Fever over 101.0 Inability to urinate Continued bleeding from incision Increased pain, redness, or drainage from the  incision Increasing abdominal pain  The clinic staff is available to answer your questions during regular business hours.  Please don't hesitate to call and ask to speak to one of the nurses for clinical concerns.  If you have a medical emergency, go to the nearest emergency room or call 911.  A surgeon from [facility] is always on call for the hospital.  Earnstine Regal, MD, [facility] Office: [place]:  [place] [phone]  Website: [email or web address]   Discharge wound care:   Complete by:  As directed    It is good for closed incisions and even open wounds to be washed every day.  Shower every day.  Short baths are fine.  Wash the incisions and wounds clean with soap & water.     You may leave closed incisions open to air if it is dry.   You may cover  the incision with clean gauze & replace it after your daily shower for comfort.   If you have skin tapes (Steristrips) or skin glue (Dermabond) on your incision, leave them in place.  They will fall off on their own like a scab.  You may trim any edges that curl up with clean scissors.    If you have skin staples, set up an appointment for them to be removed in the office in 10 days after surgery.  If you have a drain, wash around the skin exit site with soap & water and place a new dressing of gauze or band aid around the skin every day.  Keep the drain site clean & dry.   Driving Restrictions   Complete by:  As directed    You may drive when: - you are no longer taking narcotic prescription pain medication - you can comfortably wear a seatbelt - you can safely make sudden turns/stops without pain.   Increase activity slowly   Complete by:  As directed    Start light daily activities --- self-care, walking, climbing stairs- beginning the day after surgery.  Gradually increase activities as tolerated.  Control your pain to be active.  Stop when you are tired.   Ideally, walk several times a day, eventually an hour a day.   Most people are back to most day-to-day activities in a few weeks.  It takes 4-6 weeks to get back to unrestricted, intense activity. If you can walk 30 minutes without difficulty, it is safe to try more intense activity such as jogging, treadmill, bicycling, low-impact aerobics, swimming, etc. Save the most intensive and strenuous activity for last (Usually 4-8 weeks after surgery) such as sit-ups, heavy lifting, contact sports, etc.  Refrain from any intense heavy lifting or straining until you are off narcotics for pain control.  You will have off days, but things should improve week-by-week. DO NOT PUSH THROUGH PAIN.  Let pain be your guide: If it hurts to do something, don't do it.   Increase activity slowly   Complete by:  As directed    Lifting restrictions   Complete by:  As directed    If you can walk 30 minutes without difficulty, it is safe to try more intense activity such as jogging, treadmill, bicycling, low-impact aerobics, swimming, etc. Save the most intensive and strenuous activity for last (Usually 4-8 weeks after surgery) such as sit-ups, heavy lifting, contact sports, etc.   Refrain from any intense heavy lifting or straining until you are off narcotics for pain control.  You will have off days, but things should improve week-by-week. DO NOT PUSH THROUGH PAIN.  Let pain be your guide: If it hurts to do something, don't do it.  Pain is your body warning you to avoid that activity for another week until the pain goes down.   May shower / Bathe   Complete by:  As directed    May walk up steps   Complete by:  As directed    Remove dressing in 24 hours   Complete by:  As directed    Sexual Activity Restrictions   Complete by:  As directed    You may have sexual intercourse when it is comfortable. If it hurts to do something, stop.     Allergies as of 09/12/2018      Reactions   Hydrocodone-acetaminophen Nausea And  Vomiting      Medication List    STOP taking these medications   amoxicillin  875 MG tablet Commonly known as:  AMOXIL     TAKE these medications   fluconazole 150 MG tablet Commonly known as:  DIFLUCAN Take at onset of symptoms may repeat in 5 days.   multivitamins with iron Tabs tablet Take 1 tablet by mouth daily.   NUVARING 0.12-0.015 MG/24HR vaginal ring Generic drug:  etonogestrel-ethinyl estradiol Place 1 application vaginally every 30 (thirty) days.   olmesartan-hydrochlorothiazide 40-25 MG tablet Commonly known as:  BENICAR HCT Take 1 tablet by mouth daily.   ondansetron 4 MG tablet Commonly known as:  ZOFRAN Take 1 tablet (4 mg total) by mouth every 8 (eight) hours as needed for nausea.   oxyCODONE 5 MG immediate release tablet Commonly known as:  Oxy IR/ROXICODONE Take 1 tablet (5 mg total) by mouth every 4 (four) hours as needed for moderate pain or severe pain.            Discharge Care Instructions  (From admission, onward)         Start     Ordered   09/11/18 0000  Discharge wound care:    Comments:  It is good for closed incisions and even open wounds to be washed every day.  Shower every day.  Short baths are fine.  Wash the incisions and wounds clean with soap & water.     You may leave closed incisions open to air if it is dry.   You may cover the incision with clean gauze & replace it after your daily shower for comfort.   If you have skin tapes (Steristrips) or skin glue (Dermabond) on your incision, leave them in place.  They will fall off on their own like a scab.  You may trim any edges that curl up with clean scissors.    If you have skin staples, set up an appointment for them to be removed in the office in 10 days after surgery.  If you have a drain, wash around the skin exit site with soap & water and place a new dressing of gauze or band aid around the skin every day.  Keep the drain site clean & dry.   09/11/18 1755          Follow-up Information    [facility] Follow up on 09/22/2018.   Specialty:  General Surgery Why:  your appointment is at 11:45 AM.  Be at the office 30 minutes early for check in.  Bring photo ID and insurance information.   Contact information: [address] [place] [address] [phone]           Janney Priego M. Victora Irby, MD, [facility] Office: [phone]   Signed: Armandina Gemma 09/12/2018, 8:51 AM

## 2018-09-12 NOTE — Progress Notes (Signed)
Assessment unchanged. Pt verbalized understanding of dc instructions through teach back including follow up care and when to call the doctor. No scripts. Discharged via wc to front entrance accompanied by NT and daughter.

## 2018-09-18 ENCOUNTER — Ambulatory Visit (INDEPENDENT_AMBULATORY_CARE_PROVIDER_SITE_OTHER): Payer: 59 | Admitting: Nurse Practitioner

## 2018-09-18 ENCOUNTER — Encounter: Payer: Self-pay | Admitting: Nurse Practitioner

## 2018-09-18 ENCOUNTER — Other Ambulatory Visit: Payer: Self-pay

## 2018-09-18 VITALS — BP 130/62 | HR 84 | Temp 98.2°F | Ht 64.6 in | Wt 227.8 lb

## 2018-09-18 DIAGNOSIS — Z09 Encounter for follow-up examination after completed treatment for conditions other than malignant neoplasm: Secondary | ICD-10-CM | POA: Diagnosis not present

## 2018-09-18 DIAGNOSIS — R1011 Right upper quadrant pain: Secondary | ICD-10-CM | POA: Diagnosis not present

## 2018-09-18 DIAGNOSIS — K5909 Other constipation: Secondary | ICD-10-CM | POA: Diagnosis not present

## 2018-09-18 DIAGNOSIS — Z9049 Acquired absence of other specified parts of digestive tract: Secondary | ICD-10-CM

## 2018-09-18 HISTORY — DX: Right upper quadrant pain: R10.11

## 2018-09-18 MED ORDER — BENZONATATE 100 MG PO CAPS: 100.0000 mg | ORAL_CAPSULE | Freq: Four times a day (QID) | ORAL | 1 refills | Status: DC | PRN

## 2018-09-18 NOTE — Progress Notes (Addendum)
Subjective:     Patient ID: [name] , female    DOB: 07-28-1976 , 43 y.o.   MRN: [id number]   Chief Complaint  Patient presents with  . Hospitalization Follow-up    HPI  Here today for hospital follow up after having cholecystectomy.  She went to the ER 09/11/2018 and was admitted and discharged on 09/12/2018 after having acute recurrent epigastric pain.  The Wednesday before had abdominal pain then the next day she was taking baking soda without relief.    She has a follow up with Dr. Johney Maine on Tuesday Feb 18th.    She has still not had a good bowel movement, has been doing miralax every 2 hours in addition to warm drinks with minimal results.  Continues to have right upper quadrant tenderness.     Past Medical History:  Diagnosis Date  . Acute bronchitis 09/11/2018  . Acute dermatitis 09/11/2018  . Acute maxillary sinusitis 09/11/2018  . Acute urinary tract infection 09/11/2018  . Anemia   . 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 Hyperlipidemia    patient denies  . Hypertension   . Mass    on rt inner thigh  . Trichomonal vaginitis 09/11/2018     Family History  Problem Relation Age of Onset  . COPD Mother      Current Outpatient Medications:  .  NUVARING 0.12-0.015 MG/24HR vaginal ring, Place 1 application vaginally every 30 (thirty) days., Disp: , Rfl: 11 .  olmesartan-hydrochlorothiazide (BENICAR HCT) 40-25 MG tablet, Take 1 tablet by mouth daily., Disp: , Rfl: 1 .  ondansetron (ZOFRAN) 4 MG tablet, Take 1 tablet (4 mg total) by mouth every 8 (eight) hours as needed for nausea., Disp: 6 tablet, Rfl: 2 .  oxyCODONE (OXY IR/ROXICODONE) 5 MG immediate release tablet, Take 1 tablet (5 mg total) by mouth every 4 (four) hours as needed for moderate pain or severe pain., Disp: 20 tablet, Rfl: 0 .  fluconazole (DIFLUCAN) 150 MG tablet, Take at onset of symptoms may repeat in 5 days. (Patient not taking: Reported on 09/11/2018), Disp: 2 tablet, Rfl: 0 .  Multiple  Vitamins-Iron (MULTIVITAMINS WITH IRON) TABS tablet, Take 1 tablet by mouth daily., Disp: , Rfl:    Allergies  Allergen Reactions  . Hydrocodone-Acetaminophen Nausea And Vomiting     Review of Systems  Constitutional: Negative for fatigue and fever.  Eyes: Negative for photophobia.  Respiratory: Negative for cough.   Cardiovascular: Negative for chest pain, palpitations and leg swelling.  Gastrointestinal: Positive for constipation. Negative for diarrhea and nausea.  Endocrine: Negative for polydipsia, polyphagia and polyuria.     Today's Vitals   09/18/18 1050  BP: 130/62  Pulse: 84  Temp: 98.2 F (36.8 C)  TempSrc: Oral  SpO2: 97%  Weight: 227 lb 12.8 oz (103.3 kg)  Height: 5' 4.6" (1.641 m)   Body mass index is 38.38 kg/m.   Objective:  Physical Exam Vitals signs reviewed.  Constitutional:      Appearance: Normal appearance.  Cardiovascular:     Rate and Rhythm: Normal rate and regular rhythm.     Pulses: Normal pulses.     Heart sounds: Normal heart sounds. No murmur.  Pulmonary:     Effort: Pulmonary effort is normal. No respiratory distress.     Breath sounds: Normal breath sounds. No wheezing.  Abdominal:     General: Bowel sounds are decreased.     Palpations: There is no mass.     Tenderness: There  is abdominal tenderness (right upper quadrant). Negative signs include Murphy's sign and McBurney's sign.       Comments: Tender on palpation.    Sutures to outer navel area  Skin:    Capillary Refill: Capillary refill takes less than 2 seconds.  Neurological:     General: No focal deficit present.     Mental Status: She is alert.         Assessment And Plan:     1. S/P cholecystectomy  Hospitalization on 09/11/2018 - 09/12/2018 after having reoccurrent epigastric pain and GI upset, found to have cholelithiasis with gallstones.  Surgical removal by Dr. Johney Maine. TCM Performed. A member of the clinical team spoke with the patient upon dischare. Discharge  summary was reviewed in full detail during the visit. Meds reconciled and compared to discharge meds. Medication list is updated and reviewed with the patient.  Greater than 50% face to face time was spent in counseling an coordination of care.  All questions were answered to the satisfaction of the patient.   - CMP14 + Anion Gap - CBC no Diff  2. Other constipation  She still has not had a good bowel movement since before her surgery.   Has been taking miralax and warm drinks with minimal effect  I will try her on linzess 27mcg  3. Right upper quadrant abdominal pain  Continues to have significant right upper quadrant pain more than I would expect one week post op. Called to Dr. Johney Maine' office recommend CBC and CMP to start.   - CMP14 + Anion Gap - CBC no Diff       [name], FNP

## 2018-09-22 ENCOUNTER — Other Ambulatory Visit: Payer: Self-pay | Admitting: Student

## 2018-09-22 ENCOUNTER — Other Ambulatory Visit: Payer: Self-pay | Admitting: Nurse Practitioner

## 2018-09-22 ENCOUNTER — Ambulatory Visit
Admission: RE | Admit: 2018-09-22 | Discharge: 2018-09-22 | Disposition: A | Discharge status: Home / Self Care | Payer: 59 | Source: Ambulatory Visit | Attending: Student | Admitting: Student

## 2018-09-22 DIAGNOSIS — R0602 Shortness of breath: Secondary | ICD-10-CM

## 2018-09-24 ENCOUNTER — Ambulatory Visit
Admission: RE | Admit: 2018-09-24 | Discharge: 2018-09-24 | Disposition: A | Discharge status: Home / Self Care | Payer: 59 | Source: Ambulatory Visit | Attending: Student | Admitting: Student

## 2018-09-24 ENCOUNTER — Other Ambulatory Visit: Payer: Self-pay | Admitting: Student

## 2018-09-24 DIAGNOSIS — R0602 Shortness of breath: Secondary | ICD-10-CM

## 2018-09-24 MED ORDER — IOPAMIDOL (ISOVUE-370) INJECTION 76%
75.0000 mL | Freq: Once | INTRAVENOUS | Status: AC | PRN
  Administered 2018-09-24: 75 mL via INTRAVENOUS

## 2018-10-01 ENCOUNTER — Telehealth: Payer: Self-pay | Admitting: Nurse Practitioner

## 2018-10-01 NOTE — Telephone Encounter (Signed)
Called to leave message no answer about her question on her current pneumonia

## 2018-10-05 ENCOUNTER — Emergency Department (HOSPITAL_COMMUNITY)
Admission: EM | Admit: 2018-10-05 | Discharge: 2018-10-05 | Disposition: A | Discharge status: Home / Self Care | Payer: 59 | Attending: Emergency Medicine | Admitting: Emergency Medicine

## 2018-10-05 ENCOUNTER — Other Ambulatory Visit: Payer: Self-pay

## 2018-10-05 ENCOUNTER — Encounter (HOSPITAL_COMMUNITY): Payer: Self-pay | Admitting: Emergency Medicine

## 2018-10-05 ENCOUNTER — Emergency Department (HOSPITAL_COMMUNITY): Payer: 59

## 2018-10-05 DIAGNOSIS — S92514A Nondisplaced fracture of proximal phalanx of right lesser toe(s), initial encounter for closed fracture: Secondary | ICD-10-CM | POA: Diagnosis not present

## 2018-10-05 DIAGNOSIS — Z79899 Other long term (current) drug therapy: Secondary | ICD-10-CM | POA: Insufficient documentation

## 2018-10-05 DIAGNOSIS — I10 Essential (primary) hypertension: Secondary | ICD-10-CM | POA: Insufficient documentation

## 2018-10-05 DIAGNOSIS — R062 Wheezing: Secondary | ICD-10-CM | POA: Insufficient documentation

## 2018-10-05 DIAGNOSIS — R05 Cough: Secondary | ICD-10-CM | POA: Insufficient documentation

## 2018-10-05 DIAGNOSIS — Y939 Activity, unspecified: Secondary | ICD-10-CM | POA: Insufficient documentation

## 2018-10-05 DIAGNOSIS — Y999 Unspecified external cause status: Secondary | ICD-10-CM | POA: Diagnosis not present

## 2018-10-05 DIAGNOSIS — Y92003 Bedroom of unspecified non-institutional (private) residence as the place of occurrence of the external cause: Secondary | ICD-10-CM | POA: Insufficient documentation

## 2018-10-05 DIAGNOSIS — W2209XA Striking against other stationary object, initial encounter: Secondary | ICD-10-CM | POA: Insufficient documentation

## 2018-10-05 DIAGNOSIS — S99921A Unspecified injury of right foot, initial encounter: Secondary | ICD-10-CM | POA: Diagnosis present

## 2018-10-05 LAB — CBC WITH DIFFERENTIAL/PLATELET
Abs Immature Granulocytes: 0.04 10*3/uL (ref 0.00–0.07)
BASOS ABS: 0 10*3/uL (ref 0.0–0.1)
Basophils Relative: 0 %
Eosinophils Absolute: 0.3 10*3/uL (ref 0.0–0.5)
Eosinophils Relative: 5 %
HCT: 34.9 % — ABNORMAL LOW (ref 36.0–46.0)
Hemoglobin: 11 g/dL — ABNORMAL LOW (ref 12.0–15.0)
Immature Granulocytes: 1 %
Lymphocytes Relative: 44 %
Lymphs Abs: 2.9 10*3/uL (ref 0.7–4.0)
MCH: 26.4 pg (ref 26.0–34.0)
MCHC: 31.5 g/dL (ref 30.0–36.0)
MCV: 83.9 fL (ref 80.0–100.0)
Monocytes Absolute: 0.4 10*3/uL (ref 0.1–1.0)
Monocytes Relative: 7 %
Neutro Abs: 2.8 10*3/uL (ref 1.7–7.7)
Neutrophils Relative %: 43 %
Platelets: 289 10*3/uL (ref 150–400)
RBC: 4.16 MIL/uL (ref 3.87–5.11)
RDW: 13.5 % (ref 11.5–15.5)
WBC: 6.5 10*3/uL (ref 4.0–10.5)
nRBC: 0 % (ref 0.0–0.2)

## 2018-10-05 LAB — COMPREHENSIVE METABOLIC PANEL
ALT: 57 U/L — ABNORMAL HIGH (ref 0–44)
AST: 28 U/L (ref 15–41)
Albumin: 3.5 g/dL (ref 3.5–5.0)
Alkaline Phosphatase: 80 U/L (ref 38–126)
Anion gap: 6 (ref 5–15)
BUN: 11 mg/dL (ref 6–20)
CO2: 26 mmol/L (ref 22–32)
Calcium: 8.7 mg/dL — ABNORMAL LOW (ref 8.9–10.3)
Chloride: 107 mmol/L (ref 98–111)
Creatinine, Ser: 0.58 mg/dL (ref 0.44–1.00)
GFR calc Af Amer: >60 mL/min (ref >60)
GFR calc non Af Amer: >60 mL/min (ref >60)
Glucose, Bld: 94 mg/dL (ref 70–99)
POTASSIUM: 3.4 mmol/L — AB (ref 3.5–5.1)
Sodium: 139 mmol/L (ref 135–145)
Total Bilirubin: 0.3 mg/dL (ref 0.3–1.2)
Total Protein: 6.8 g/dL (ref 6.5–8.1)

## 2018-10-05 MED ORDER — ALBUTEROL SULFATE HFA 108 (90 BASE) MCG/ACT IN AERS: 1.0000 | INHALATION_SPRAY | Freq: Four times a day (QID) | RESPIRATORY_TRACT | 0 refills | Status: DC | PRN

## 2018-10-05 MED ORDER — IBUPROFEN 800 MG PO TABS: 800.0000 mg | ORAL_TABLET | Freq: Three times a day (TID) | ORAL | 0 refills | Status: DC

## 2018-10-05 NOTE — ED Triage Notes (Signed)
pt reports 2 saturdays ago hit her right foot on her bed and been having pain and swelling since.   Pt was diagnosed on 2/20 with PNA and finished her steroids and antibiotics yesterday but will still have intermittent wheezing esp when using the incentive spirometer.

## 2018-10-05 NOTE — ED Notes (Signed)
Patient transported to X-ray 

## 2018-10-05 NOTE — ED Provider Notes (Signed)
[facility] DEPT Provider Note   CSN: [id number] Arrival date & time: 10/05/18  0806    History   Chief Complaint Chief Complaint  Patient presents with  . Toe Pain  . Wheezing    HPI [name] is a 43 y.o. female with a PMH of acute bronchitis, HLD, and HTN presents with wheezing and cough onset 2 weeks ago. Patient states she was diagnosed with pneumonia on 2/20 and prescribes steroids and antibiotics. Patient reports she finished her antibiotics, but continues to have intermittent wheezing at times. Patient states symptoms are worse when using her incentive spirometer. Patient states symptoms have overall improved a lot, but patient continues to have an intermittent cough. Patient denies shortness of breath, chest pain, fever, chills, congestion, or rhinorrhea. Patient denies nausea, vomiting, abdominal pain, or diarrhea. Patient denies sick exposures.   Patient also reports constant right foot pain onset 2 weeks ago after hitting toe on bed. Patient reports edema and describes pain as an ache. Patient states she has tried ice and elevation without relief.  Patient states symptoms are worse with movement and pressure. Patient denies weakness, numbness, or paresthesias.      HPI  Past Medical History:  Diagnosis Date  . Acute bronchitis 09/11/2018  . Acute dermatitis 09/11/2018  . Acute maxillary sinusitis 09/11/2018  . Acute urinary tract infection 09/11/2018  . Anemia   . 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 Hyperlipidemia    patient denies  . Hypertension   . Mass    on rt inner thigh  . Trichomonal vaginitis 09/11/2018    Patient Active Problem List   Diagnosis Date Noted  . Other constipation 09/18/2018  . Right upper quadrant abdominal pain 09/18/2018  . Candidal vulvovaginitis 09/11/2018  . Lichenification 16/02/3709  . Acute calculous cholecystitis 09/11/2018  . Acute cholecystitis 09/11/2018  . Essential hypertension  08/21/2018  . Class 2 obesity due to excess calories without serious comorbidity with body mass index (BMI) of 35.0 to 35.9 in adult 08/21/2018  . 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  . Vertigo 05/19/2018  . S/P myomectomy 07/20/2015    Class: Status post    Past Surgical History:  Procedure Laterality Date  . BREAST EXCISIONAL BIOPSY Left 2003   papiloma removed  . BREAST SURGERY     left breast papilloma  . CESAREAN SECTION  07/11/03  . LAPAROSCOPIC CHOLECYSTECTOMY SINGLE SITE WITH INTRAOPERATIVE CHOLANGIOGRAM N/A 09/11/2018   Procedure: LAPAROSCOPIC CHOLECYSTECTOMY SINGLE SITE WITH INTRAOPERATIVE CHOLANGIOGRAM;  Surgeon: Michael Boston, MD;  Location: [facility];  Service: General;  Laterality: N/A;  . MASS EXCISION  05/21/05   inner lt thigh  . MYOMECTOMY N/A 07/20/2015   Procedure: MYOMECTOMY, ABDOMINAL;  Surgeon: Arvella Nigh, MD;  Location: [facility];  Service: Gynecology;  Laterality: N/A;  . WISDOM TOOTH EXTRACTION       OB History   No obstetric history on file.      Home Medications    Prior to Admission medications   Medication Sig Start Date End Date Taking? Authorizing Provider  acetaminophen (TYLENOL) 500 MG tablet Take 1,000 mg by mouth every 6 (six) hours as needed for headache.   Yes [provider]  benzonatate (TESSALON PERLES) 100 MG capsule Take 1 capsule (100 mg total) by mouth every 6 (six) hours as needed for cough. 09/18/18 09/18/19 Yes [name], FNP  guaifenesin (ROBITUSSIN) 100 MG/5ML syrup Take 200 mg by mouth 3 (three) times daily as needed for  cough.   Yes [provider]  NUVARING 0.12-0.015 MG/24HR vaginal ring Place 1 application vaginally every 30 (thirty) days. 03/23/18  Yes [provider]  olmesartan-hydrochlorothiazide (BENICAR HCT) 40-25 MG tablet TAKE 1 TABLET BY MOUTH EVERY DAY Patient taking differently: Take 1 tablet by mouth daily.  09/22/18  Yes [name], FNP  ondansetron (ZOFRAN) 4 MG tablet Take 1 tablet (4  mg total) by mouth every 8 (eight) hours as needed for nausea. 09/11/18  Yes Michael Boston, MD  oxyCODONE (OXY IR/ROXICODONE) 5 MG immediate release tablet Take 1 tablet (5 mg total) by mouth every 4 (four) hours as needed for moderate pain or severe pain. 09/11/18  Yes Michael Boston, MD  albuterol (PROVENTIL HFA;VENTOLIN HFA) 108 (90 Base) MCG/ACT inhaler Inhale 1-2 puffs into the lungs every 6 (six) hours as needed for wheezing or shortness of breath. 10/05/18   Darlin Drop P, PA-C  doxycycline (VIBRA-TABS) 100 MG tablet Take 100 mg by mouth 2 (two) times daily. 10 day course 09/24/18   [provider]  fluconazole (DIFLUCAN) 150 MG tablet Take at onset of symptoms may repeat in 5 days. Patient not taking: Reported on 09/11/2018 08/27/18   [name], FNP    Family History Family History  Problem Relation Age of Onset  . COPD Mother     Social History Social History   Tobacco Use  . Smoking status: Never Smoker  . Smokeless tobacco: Never Used  Substance Use Topics  . Alcohol use: No  . Drug use: No     Allergies   Hydrocodone-acetaminophen   Review of Systems Review of Systems  Constitutional: Negative for chills, diaphoresis, fatigue, fever and unexpected weight change.  HENT: Negative for congestion, rhinorrhea, sore throat and trouble swallowing.   Eyes: Negative for visual disturbance.  Respiratory: Positive for cough and wheezing. Negative for chest tightness, shortness of breath and stridor.   Cardiovascular: Negative for chest pain, palpitations and leg swelling.  Gastrointestinal: Negative for abdominal pain, nausea and vomiting.  Endocrine: Negative for cold intolerance and heat intolerance.  Musculoskeletal: Positive for arthralgias and joint swelling. Negative for gait problem.  Skin: Negative for color change and wound.  Allergic/Immunologic: Negative for environmental allergies and food allergies.  Neurological: Negative for dizziness, syncope, speech  difficulty, weakness, light-headedness and numbness.  Psychiatric/Behavioral: The patient is not nervous/anxious.    Physical Exam Updated Vital Signs BP (!) 149/88 (BP Location: Right Arm)   Pulse 95   Temp 98.5 F (36.9 C) (Oral)   Resp 17   LMP 09/21/2018   SpO2 98%   Physical Exam Vitals signs and nursing note reviewed.  Constitutional:      General: She is not in acute distress.    Appearance: She is well-developed. She is not diaphoretic.  HENT:     Head: Normocephalic and atraumatic.     Right Ear: Tympanic membrane, ear canal and external ear normal.     Left Ear: Tympanic membrane, ear canal and external ear normal.     Nose: Nose normal. No congestion or rhinorrhea.     Mouth/Throat:     Mouth: Mucous membranes are moist.     Pharynx: No posterior oropharyngeal erythema.  Eyes:     Extraocular Movements: Extraocular movements intact.     Conjunctiva/sclera: Conjunctivae normal.     Pupils: Pupils are equal, round, and reactive to light.  Neck:     Musculoskeletal: Normal range of motion and neck supple.  Cardiovascular:  Rate and Rhythm: Normal rate and regular rhythm.     Heart sounds: Normal heart sounds. No murmur. No friction rub. No gallop.   Pulmonary:     Effort: Pulmonary effort is normal. No respiratory distress.     Breath sounds: Normal breath sounds. No wheezing or rales.  Abdominal:     Palpations: Abdomen is soft.     Tenderness: There is no abdominal tenderness.  Musculoskeletal:     Right knee: Normal. She exhibits normal range of motion, no swelling and no effusion.     Right ankle: Normal. She exhibits normal range of motion, no swelling and no ecchymosis.     Left ankle: Normal. She exhibits normal range of motion, no swelling and no ecchymosis.     Right foot: Decreased range of motion. Tenderness and bony tenderness present. No swelling.     Left foot: Normal. Normal range of motion. No tenderness or bony tenderness.     Comments: No  skin changes noted. Decreased ROM of 3rd phalanx of right foot due to pain. Tenderness upon palpation of 3rd phalanx of right foot. Sensation intact. 2+ DP pulses bilaterally. Patient is able to ambulate with minimal discomfort.   Skin:    General: Skin is warm.     Findings: No erythema or rash.  Neurological:     Mental Status: She is alert and oriented to person, place, and time.    ED Treatments / Results  Labs (all labs ordered are listed, but only abnormal results are displayed) Labs Reviewed  CBC WITH DIFFERENTIAL/PLATELET - Abnormal; Notable for the following components:      Result Value   Hemoglobin 11.0 (*)    HCT 34.9 (*)    All other components within normal limits  COMPREHENSIVE METABOLIC PANEL - Abnormal; Notable for the following components:   Potassium 3.4 (*)    Calcium 8.7 (*)    ALT 57 (*)    All other components within normal limits    EKG None  Radiology Dg Chest 2 View  Result Date: 10/05/2018 CLINICAL DATA:  Wheezing with pneumonia since gallbladder surgery 1 month ago. EXAM: CHEST - 2 VIEW COMPARISON:  09/22/2018 FINDINGS: Lungs are adequately inflated and otherwise clear. Interval clearing of previously seen left basilar atelectasis. Cardiomediastinal silhouette and remainder of the exam is unchanged. IMPRESSION: No acute cardiopulmonary disease. Electronically Signed   By: Marin Olp M.D.   On: 10/05/2018 09:19   Dg Foot Complete Right  Result Date: 10/05/2018 CLINICAL DATA:  Posttraumatic third toe pain after injury 8 days ago. EXAM: RIGHT FOOT COMPLETE - 3+ VIEW COMPARISON:  None. FINDINGS: Nondisplaced oblique fracture of the third proximal phalanx involving the distal shaft. IMPRESSION: Nondisplaced third proximal phalanx fracture. Electronically Signed   By: Monte Fantasia M.D.   On: 10/05/2018 09:18    Procedures Procedures (including critical care time)  Medications Ordered in ED Medications - No data to display   Initial Impression /  Assessment and Plan / ED Course  I have reviewed the triage vital signs and the nursing notes.  Pertinent labs & imaging results that were available during my care of the patient were reviewed by me and considered in my medical decision making (see chart for details).  Clinical Course as of Oct 04 1024  Mon Oct 05, 2018  0921 Nondisplaced third proximal phalanx fracture noted on foot x ray.  DG Foot Complete Right [AH]  9678 No acute cardiopulmonary disease.  DG Chest 2 View [AH]  0944 Low hemoglobin noted at 11. This appears to be patient's baseline when compared to previous values.  Hemoglobin(!): 11.0 [AH]  0944 WBCs within normal limits.  WBC: 6.5 [AH]  1009 Elevated ALT at 57. Will advise patient to follow up with PCP regarding elevated liver enzyme.  ALT(!): 57 [AH]    Clinical Course User Index [AH] Arville Lime, PA-C      Patient presents with intermittent wheezing and cough. Patient did not have wheezing or a cough on exam. CXR does not reveal acute cardiopulmonary disease. WBCs are within normal limits and patient is afebrile. Will prescribe albuterol for intermittent wheezing. Discussed returning to ER for new or worsening symptoms. Patient states she understands and agrees with plan.  Patient X-Ray with a nondisplaced third proximal phalanx fracture on right foot. Pain managed in ED without medications. Pt advised to follow up with orthopedics for further evaluation and treatment. Provided buddy tape of toes and hard shoe while in ED, conservative therapy recommended and discussed. Patient will be dc home & is agreeable with above plan. I have also discussed reasons to return immediately to the ER.  Patient expresses understanding and agrees with plan.  Final Clinical Impressions(s) / ED Diagnoses   Final diagnoses:  Closed nondisplaced fracture of proximal phalanx of lesser toe of right foot, initial encounter  Wheezing    ED Discharge Orders         Ordered     albuterol (PROVENTIL HFA;VENTOLIN HFA) 108 (90 Base) MCG/ACT inhaler  Every 6 hours PRN     10/05/18 1017           Darlin Drop [place], [place] 10/05/18 1026    Sherwood Gambler, MD 10/05/18 1535

## 2018-10-05 NOTE — Discharge Instructions (Addendum)
1. Medications: alternate ibuprofen and tylenol for pain control, albuterol for wheezing, usual home medications 2. Treatment: rest, ice, elevate and use hard shoe, drink plenty of fluids 3. Follow Up: Please follow up with orthopedics as directed or your PCP in 1 week if no improvement for discussion of your diagnoses and further evaluation after today's visit. Your liver enzymes were mildly elevated today. This may be due to a recent infection, but please follow up with PCP to monitor liver enzymes. Please return to the ER for worsening symptoms or other concerns.

## 2018-10-12 ENCOUNTER — Ambulatory Visit (INDEPENDENT_AMBULATORY_CARE_PROVIDER_SITE_OTHER): Payer: 59 | Admitting: Nurse Practitioner

## 2018-10-12 ENCOUNTER — Encounter: Payer: Self-pay | Admitting: Nurse Practitioner

## 2018-10-12 VITALS — BP 132/88 | HR 92 | Ht 64.5 in | Wt 233.0 lb

## 2018-10-12 DIAGNOSIS — S92504D Nondisplaced unspecified fracture of right lesser toe(s), subsequent encounter for fracture with routine healing: Secondary | ICD-10-CM

## 2018-10-12 DIAGNOSIS — J9811 Atelectasis: Secondary | ICD-10-CM | POA: Diagnosis not present

## 2018-10-12 DIAGNOSIS — J189 Pneumonia, unspecified organism: Secondary | ICD-10-CM

## 2018-10-12 DIAGNOSIS — R05 Cough: Secondary | ICD-10-CM | POA: Diagnosis not present

## 2018-10-12 DIAGNOSIS — J181 Lobar pneumonia, unspecified organism: Secondary | ICD-10-CM

## 2018-10-12 DIAGNOSIS — R059 Cough, unspecified: Secondary | ICD-10-CM

## 2018-10-12 HISTORY — DX: Nondisplaced unspecified fracture of right lesser toe(s), subsequent encounter for fracture with routine healing: S92.504D

## 2018-10-12 HISTORY — DX: Pneumonia, unspecified organism: J18.9

## 2018-10-12 MED ORDER — PREDNISONE 10 MG (21) PO TBPK: ORAL_TABLET | ORAL | 0 refills | Status: DC

## 2018-10-12 MED ORDER — ALBUTEROL SULFATE HFA 108 (90 BASE) MCG/ACT IN AERS: 1.0000 | INHALATION_SPRAY | Freq: Four times a day (QID) | RESPIRATORY_TRACT | 0 refills | Status: DC | PRN

## 2018-10-12 MED ORDER — IBUPROFEN 800 MG PO TABS: 800.0000 mg | ORAL_TABLET | Freq: Three times a day (TID) | ORAL | 0 refills | Status: DC

## 2018-10-12 NOTE — Progress Notes (Signed)
Subjective:     Patient ID: [name] , female    DOB: 06-24-1976 , 43 y.o.   MRN: [id number]   Chief Complaint  Patient presents with  . Foot Pain     fx 3rd toe , swelling  , using ice , wearing post op shoe    HPI  She went back to work today, now having tingling to right lower leg.  Keep elevated.  Now having more swelling.    Foot Pain  This is a new problem. The current episode started in the past 7 days. Associated symptoms include coughing. Pertinent negatives include no chest pain.     Past Medical History:  Diagnosis Date  . Acute bronchitis 09/11/2018  . Acute dermatitis 09/11/2018  . Acute maxillary sinusitis 09/11/2018  . Acute urinary tract infection 09/11/2018  . Anemia   . 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 Hyperlipidemia    patient denies  . Hypertension   . Mass    on rt inner thigh  . Trichomonal vaginitis 09/11/2018     Family History  Problem Relation Age of Onset  . COPD Mother      Current Outpatient Medications:  .  acetaminophen (TYLENOL) 500 MG tablet, Take 1,000 mg by mouth every 6 (six) hours as needed for headache., Disp: , Rfl:  .  albuterol (PROVENTIL HFA;VENTOLIN HFA) 108 (90 Base) MCG/ACT inhaler, Inhale 1-2 puffs into the lungs every 6 (six) hours as needed for wheezing or shortness of breath., Disp: 1 Inhaler, Rfl: 0 .  benzonatate (TESSALON PERLES) 100 MG capsule, Take 1 capsule (100 mg total) by mouth every 6 (six) hours as needed for cough., Disp: 30 capsule, Rfl: 1 .  ibuprofen (ADVIL,MOTRIN) 800 MG tablet, Take 1 tablet (800 mg total) by mouth 3 (three) times daily., Disp: 21 tablet, Rfl: 0 .  NUVARING 0.12-0.015 MG/24HR vaginal ring, Place 1 application vaginally every 30 (thirty) days., Disp: , Rfl: 11 .  olmesartan-hydrochlorothiazide (BENICAR HCT) 40-25 MG tablet, TAKE 1 TABLET BY MOUTH EVERY DAY (Patient taking differently: Take 1 tablet by mouth daily. ), Disp: 90 tablet, Rfl: 1   Allergies  Allergen Reactions  .  Hydrocodone-Acetaminophen Nausea And Vomiting     Review of Systems  Respiratory: Positive for cough, shortness of breath and wheezing. Negative for choking.   Cardiovascular: Negative for chest pain, palpitations and leg swelling.     Today's Vitals   10/12/18 1630  BP: 132/88  Pulse: 92  SpO2: 97%  Weight: 233 lb (105.7 kg)  Height: 5' 4.5" (1.638 m)   Body mass index is 39.38 kg/m.   Objective:  Physical Exam Constitutional:      Appearance: Normal appearance.  Cardiovascular:     Rate and Rhythm: Normal rate.     Pulses: Normal pulses.     Heart sounds: Normal heart sounds. No murmur.  Pulmonary:     Effort: Pulmonary effort is normal. No respiratory distress.     Breath sounds: Wheezing (throughout) present. No rhonchi.  Chest:     Chest wall: No tenderness.  Musculoskeletal:        General: Tenderness (right 3rd toe) present. No swelling (right 3rd toe) or deformity.     Comments: Buddy tape in place  Skin:    General: Skin is warm and dry.     Capillary Refill: Capillary refill takes less than 2 seconds.  Neurological:     General: No focal deficit present.     Mental  Status: She is alert and oriented to person, place, and time.  Psychiatric:        Mood and Affect: Mood normal.        Behavior: Behavior normal.        Thought Content: Thought content normal.        Judgment: Judgment normal.         Assessment And Plan:     1. Cough  Continues to have residual cough from her pneumonia. She had another CXR which showed bibasilar atelectasis improving - albuterol (PROVENTIL HFA;VENTOLIN HFA) 108 (90 Base) MCG/ACT inhaler; Inhale 1-2 puffs into the lungs every 6 (six) hours as needed for wheezing or shortness of breath.  Dispense: 1 Inhaler; Refill: 0 - predniSONE (STERAPRED UNI-PAK 21 TAB) 10 MG (21) TBPK tablet; Take as directed  Dispense: 21 tablet; Refill: 0  2. Closed nondisplaced fracture of phalanx of lesser toe of right foot with routine healing,  unspecified phalanx, subsequent encounter  Xray done at ER showed fracture to right 3rd toe  Continues to have pain and swelling to 3rd toe  She is advised to go to [facility] to the after hours if she does not go she is to call the office so I can place a referral to an orthopedic - ibuprofen (ADVIL,MOTRIN) 800 MG tablet; Take 1 tablet (800 mg total) by mouth 3 (three) times daily.  Dispense: 21 tablet; Refill: 0  3. Pneumonia of both lower lobes due to infectious organism ([place])  Continues to have wheezing throughout - predniSONE (STERAPRED UNI-PAK 21 TAB) 10 MG (21) TBPK tablet; Take as directed  Dispense: 21 tablet; Refill: 0    [name], FNP

## 2018-10-15 ENCOUNTER — Encounter: Payer: Self-pay | Admitting: Nurse Practitioner

## 2018-10-16 ENCOUNTER — Other Ambulatory Visit: Payer: Self-pay | Admitting: Nurse Practitioner

## 2018-10-16 DIAGNOSIS — S92504D Nondisplaced unspecified fracture of right lesser toe(s), subsequent encounter for fracture with routine healing: Secondary | ICD-10-CM

## 2018-10-26 ENCOUNTER — Ambulatory Visit: Payer: 59 | Admitting: Nurse Practitioner

## 2019-01-19 ENCOUNTER — Telehealth: Payer: Self-pay

## 2019-01-19 NOTE — Telephone Encounter (Signed)
I called and notified pt that her forms are completed and ready to be picked up. YRL,RMA

## 2019-01-27 ENCOUNTER — Encounter: Payer: Self-pay | Admitting: Nurse Practitioner

## 2019-02-15 ENCOUNTER — Ambulatory Visit (INDEPENDENT_AMBULATORY_CARE_PROVIDER_SITE_OTHER): Payer: 59 | Admitting: Nurse Practitioner

## 2019-02-15 ENCOUNTER — Other Ambulatory Visit: Payer: Self-pay

## 2019-02-15 ENCOUNTER — Encounter: Payer: Self-pay | Admitting: Nurse Practitioner

## 2019-02-15 VITALS — BP 114/70 | HR 81 | Temp 98.5°F | Ht 67.0 in | Wt 240.2 lb

## 2019-02-15 DIAGNOSIS — E6609 Other obesity due to excess calories: Secondary | ICD-10-CM

## 2019-02-15 DIAGNOSIS — Z6837 Body mass index (BMI) 37.0-37.9, adult: Secondary | ICD-10-CM

## 2019-02-15 DIAGNOSIS — E559 Vitamin D deficiency, unspecified: Secondary | ICD-10-CM

## 2019-02-15 DIAGNOSIS — I10 Essential (primary) hypertension: Secondary | ICD-10-CM

## 2019-02-15 DIAGNOSIS — F4321 Adjustment disorder with depressed mood: Secondary | ICD-10-CM | POA: Insufficient documentation

## 2019-02-15 DIAGNOSIS — Z0000 Encounter for general adult medical examination without abnormal findings: Secondary | ICD-10-CM | POA: Diagnosis not present

## 2019-02-15 DIAGNOSIS — G8929 Other chronic pain: Secondary | ICD-10-CM

## 2019-02-15 DIAGNOSIS — M25561 Pain in right knee: Secondary | ICD-10-CM

## 2019-02-15 HISTORY — DX: Adjustment disorder with depressed mood: F43.21

## 2019-02-15 LAB — POCT URINALYSIS DIPSTICK
Bilirubin, UA: NEGATIVE
Glucose, UA: NEGATIVE
Ketones, UA: NEGATIVE
Nitrite, UA: NEGATIVE
Protein, UA: NEGATIVE
Spec Grav, UA: 1.02 (ref 1.010–1.025)
Urobilinogen, UA: 0.2 E.U./dL
pH, UA: 7 (ref 5.0–8.0)

## 2019-02-15 LAB — POCT UA - MICROALBUMIN
Albumin/Creatinine Ratio, Urine, POC: 30
Creatinine, POC: 300 mg/dL
Microalbumin Ur, POC: 10 mg/L

## 2019-02-15 MED ORDER — PHENTERMINE HCL 15 MG PO CAPS: 15.0000 mg | ORAL_CAPSULE | ORAL | 1 refills | Status: DC

## 2019-02-15 NOTE — Patient Instructions (Signed)
Health Maintenance  Topic Date Due  . INFLUENZA VACCINE  03/06/2019  . PAP SMEAR-Modifier  03/28/2021  . TETANUS/TDAP  02/14/2029  . HIV Screening  Completed   Td Vaccine (Tetanus and Diphtheria): What You Need to Know 1. Why get vaccinated? Tetanus  and diphtheria are very serious diseases. They are rare in the [place] today, but people who do become infected often have severe complications. Td vaccine is used to protect adolescents and adults from both of these diseases. Both tetanus and diphtheria are infections caused by bacteria. Diphtheria spreads from person to person through coughing or sneezing. Tetanus-causing bacteria enter the body through cuts, scratches, or wounds. TETANUS (Lockjaw) causes painful muscle tightening and stiffness, usually all over the body.  It can lead to tightening of muscles in the head and neck so you can't open your mouth, swallow, or sometimes even breathe. Tetanus kills about 1 out of every 10 people who are infected even after receiving the best medical care. DIPHTHERIA can cause a thick coating to form in the back of the throat.  It can lead to breathing problems, paralysis, heart failure, and death. Before vaccines, as many as 200,000 cases of diphtheria and hundreds of cases of tetanus were reported in the [place] each year. Since vaccination began, reports of cases for both diseases have dropped by about 99%. 2. Td vaccine Td vaccine can protect adolescents and adults from tetanus and diphtheria. Td is usually given as a booster dose every 10 years but it can also be given earlier after a severe and dirty wound or burn. Another vaccine, called Tdap, which protects against pertussis in addition to tetanus and diphtheria, is sometimes recommended instead of Td vaccine. Your doctor or the person giving you the vaccine can give you more information. Td may safely be given at the same time as other vaccines. 3. Some people should not get this  vaccine  A person who has ever had a life-threatening allergic reaction after a previous dose of any tetanus or diphtheria containing vaccine, OR has a severe allergy to any part of this vaccine, should not get Td vaccine. Tell the person giving the vaccine about any severe allergies.  Talk to your doctor if you: ? had severe pain or swelling after any vaccine containing diphtheria or tetanus, ? ever had a condition called Guillain Barr Syndrome (GBS), ? aren't feeling well on the day the shot is scheduled. 4. Risks of a vaccine reaction With any medicine, including vaccines, there is a chance of side effects. These are usually mild and go away on their own. Serious reactions are also possible but are rare. Most people who get Td vaccine do not have any problems with it. Mild Problems following Td vaccine: (Did not interfere with activities)  Pain where the shot was given (about 8 people in 10)  Redness or swelling where the shot was given (about 1 person in 4)  Mild fever (rare)  Headache (about 1 person in 4)  Tiredness (about 1 person in 4) Moderate Problems following Td vaccine: (Interfered with activities, but did not require medical attention)  Fever over 102F (rare) Severe Problems following Td vaccine: (Unable to perform usual activities; required medical attention)  Swelling, severe pain, bleeding and/or redness in the arm where the shot was given (rare). Problems that could happen after any vaccine:  People sometimes faint after a medical procedure, including vaccination. Sitting or lying down for about 15 minutes can help prevent fainting, and injuries  caused by a fall. Tell your doctor if you feel dizzy, or have vision changes or ringing in the ears.  Some people get severe pain in the shoulder and have difficulty moving the arm where a shot was given. This happens very rarely.  Any medication can cause a severe allergic reaction. Such reactions from a vaccine are  very rare, estimated at fewer than 1 in a million doses, and would happen within a few minutes to a few hours after the vaccination. As with any medicine, there is a very remote chance of a vaccine causing a serious injury or death. The safety of vaccines is always being monitored. For more information, visit: http://www.aguilar.org/ 5. What if there is a serious reaction? What should I look for?  Look for anything that concerns you, such as signs of a severe allergic reaction, very high fever, or unusual behavior. Signs of a severe allergic reaction can include hives, swelling of the face and throat, difficulty breathing, a fast heartbeat, dizziness, and weakness. These would usually start a few minutes to a few hours after the vaccination. What should I do?  If you think it is a severe allergic reaction or other emergency that can't wait, call 9-1-1 or get the person to the nearest hospital. Otherwise, call your doctor.  Afterward, the reaction should be reported to the Vaccine Adverse Event Reporting System (VAERS). Your doctor might file this report, or you can do it yourself through the VAERS web site at www.vaers.SamedayNews.es, or by calling (865) 187-3314. VAERS does not give medical advice. 6. The National Vaccine Injury Compensation Program The [employer] Vaccine Injury Compensation Program (VICP) is a federal program that was created to compensate people who may have been injured by certain vaccines. Persons who believe they may have been injured by a vaccine can learn about the program and about filing a claim by calling 504-528-4975 or visiting the [place] website at GoldCloset.com.ee. There is a time limit to file a claim for compensation. 7. How can I learn more?  Ask your doctor. He or she can give you the vaccine package insert or suggest other sources of information.  Call your local or state health department.  Contact the Centers for Disease Control and Prevention  (CDC): ? Call 479 264 1700 (1-800-CDC-INFO) ? Visit CDC's website at http://hunter.com/ Vaccine Information Statement Td Vaccine (11/14/15) This information is not intended to replace advice given to you by your health care provider. Make sure you discuss any questions you have with your health care provider. Document Released: 05/19/2006 Document Revised: 03/09/2018 Document Reviewed: 03/09/2018 Elsevier Interactive Patient Education  2020 [place] Maintenance, Female Adopting a healthy lifestyle and getting preventive care are important in promoting health and wellness. Ask your health care provider about:  The right schedule for you to have regular tests and exams.  Things you can do on your own to prevent diseases and keep yourself healthy. What should I know about diet, weight, and exercise? Eat a healthy diet   Eat a diet that includes plenty of vegetables, fruits, low-fat dairy products, and lean protein.  Do not eat a lot of foods that are high in solid fats, added sugars, or sodium. Maintain a healthy weight Body mass index (BMI) is used to identify weight problems. It estimates body fat based on height and weight. Your health care provider can help determine your BMI and help you achieve or maintain a healthy weight. Get regular exercise Get regular exercise. This is one of the most important  things you can do for your health. Most adults should:  Exercise for at least 150 minutes each week. The exercise should increase your heart rate and make you sweat (moderate-intensity exercise).  Do strengthening exercises at least twice a week. This is in addition to the moderate-intensity exercise.  Spend less time sitting. Even light physical activity can be beneficial. Watch cholesterol and blood lipids Have your blood tested for lipids and cholesterol at 42 years of age, then have this test every 5 years. Have your cholesterol levels checked more often if:  Your  lipid or cholesterol levels are high.  You are older than 42 years of age.  You are at high risk for heart disease. What should I know about cancer screening? Depending on your health history and family history, you may need to have cancer screening at various ages. This may include screening for:  Breast cancer.  Cervical cancer.  Colorectal cancer.  Skin cancer.  Lung cancer. What should I know about heart disease, diabetes, and high blood pressure? Blood pressure and heart disease  High blood pressure causes heart disease and increases the risk of stroke. This is more likely to develop in people who have high blood pressure readings, are of African descent, or are overweight.  Have your blood pressure checked: ? Every 3-5 years if you are 40-47 years of age. ? Every year if you are 56 years old or older. Diabetes Have regular diabetes screenings. This checks your fasting blood sugar level. Have the screening done:  Once every three years after age 54 if you are at a normal weight and have a low risk for diabetes.  More often and at a younger age if you are overweight or have a high risk for diabetes. What should I know about preventing infection? Hepatitis B If you have a higher risk for hepatitis B, you should be screened for this virus. Talk with your health care provider to find out if you are at risk for hepatitis B infection. Hepatitis C Testing is recommended for:  Everyone born from [birth year] through 1964.  Anyone with known risk factors for hepatitis C. Sexually transmitted infections (STIs)  Get screened for STIs, including gonorrhea and chlamydia, if: ? You are sexually active and are younger than 42 years of age. ? You are older than 42 years of age and your health care provider tells you that you are at risk for this type of infection. ? Your sexual activity has changed since you were last screened, and you are at increased risk for chlamydia or gonorrhea. Ask  your health care provider if you are at risk.  Ask your health care provider about whether you are at high risk for HIV. Your health care provider may recommend a prescription medicine to help prevent HIV infection. If you choose to take medicine to prevent HIV, you should first get tested for HIV. You should then be tested every 3 months for as long as you are taking the medicine. Pregnancy  If you are about to stop having your period (premenopausal) and you may become pregnant, seek counseling before you get pregnant.  Take 400 to 800 micrograms (mcg) of folic acid every day if you become pregnant.  Ask for birth control (contraception) if you want to prevent pregnancy. Osteoporosis and menopause Osteoporosis is a disease in which the bones lose minerals and strength with aging. This can result in bone fractures. If you are 26 years old or older, or if you are  at risk for osteoporosis and fractures, ask your health care provider if you should:  Be screened for bone loss.  Take a calcium or vitamin D supplement to lower your risk of fractures.  Be given hormone replacement therapy (HRT) to treat symptoms of menopause. Follow these instructions at home: Lifestyle  Do not use any products that contain nicotine or tobacco, such as cigarettes, e-cigarettes, and chewing tobacco. If you need help quitting, ask your health care provider.  Do not use street drugs.  Do not share needles.  Ask your health care provider for help if you need support or information about quitting drugs. Alcohol use  Do not drink alcohol if: ? Your health care provider tells you not to drink. ? You are pregnant, may be pregnant, or are planning to become pregnant.  If you drink alcohol: ? Limit how much you use to 0-1 drink a day. ? Limit intake if you are breastfeeding.  Be aware of how much alcohol is in your drink. In the [place] one drink equals one 12 oz bottle of beer (355 mL), one 5 oz glass of wine  (148 mL), or one 1 oz glass of hard liquor (44 mL). General instructions  Schedule regular health, dental, and eye exams.  Stay current with your vaccines.  Tell your health care provider if: ? You often feel depressed. ? You have ever been abused or do not feel safe at home. Summary  Adopting a healthy lifestyle and getting preventive care are important in promoting health and wellness.  Follow your health care provider's instructions about healthy diet, exercising, and getting tested or screened for diseases.  Follow your health care provider's instructions on monitoring your cholesterol and blood pressure. This information is not intended to replace advice given to you by your health care provider. Make sure you discuss any questions you have with your health care provider. Document Released: 02/04/2011 Document Revised: 07/15/2018 Document Reviewed: 07/15/2018 Elsevier Patient Education  2020 [employer].

## 2019-02-15 NOTE — Progress Notes (Signed)
Subjective:     Patient ID: [name] , female    DOB: [birth year] , 43 y.o.   MRN: [id number]   Chief Complaint  Patient presents with  . Annual Exam   The patient states she uses NuvaRing vaginal inserts for birth control. Last LMP was Patient's last menstrual period was 01/18/2019.. Negative for Dysmenorrhea and Negative for Menorrhagia Mammogram last done 06/02/2018.  Negative for: breast discharge, breast lump(s), breast pain and breast self exam.  Pertinent negatives include abnormal bleeding (hematology), anxiety, decreased libido, depression, difficulty falling sleep, dyspareunia, history of infertility, nocturia, sexual dysfunction, sleep disturbances, urinary incontinence, urinary urgency, vaginal discharge and vaginal itching. Diet regular.  The patient states her exercise level is  now jumping rope and cleaning her garage the last few days.      The patient's tobacco use is:  Social History   Tobacco Use  Smoking Status Never Smoker  Smokeless Tobacco Never Used  . She has been exposed to passive smoke. The patient's alcohol use is:  Social History   Substance and Sexual Activity  Alcohol Use No   Additional information: Last pap 2019, next one scheduled for  2020  HPI  Here for HM  Wt Readings from Last 3 Encounters: 02/15/19 : 240 lb 3.2 oz (109 kg) 10/12/18 : 233 lb (105.7 kg) 09/18/18 : 227 lb 12.8 oz (103.3 kg)  She had surgery in February.       Past Medical History:  Diagnosis Date  . Acute bronchitis 09/11/2018  . Acute dermatitis 09/11/2018  . Acute maxillary sinusitis 09/11/2018  . Acute urinary tract infection 09/11/2018  . Anemia   . 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 Hyperlipidemia    patient denies  . Hypertension   . Mass    on rt inner thigh  . Trichomonal vaginitis 09/11/2018     Family History  Problem Relation Age of Onset  . COPD Mother   . Healthy Father      Current Outpatient Medications:  .  acetaminophen  (TYLENOL) 500 MG tablet, Take 1,000 mg by mouth every 6 (six) hours as needed for headache., Disp: , Rfl:  .  NUVARING 0.12-0.015 MG/24HR vaginal ring, Place 1 application vaginally every 30 (thirty) days., Disp: , Rfl: 11 .  olmesartan-hydrochlorothiazide (BENICAR HCT) 40-25 MG tablet, TAKE 1 TABLET BY MOUTH EVERY DAY (Patient taking differently: Take 1 tablet by mouth daily. ), Disp: 90 tablet, Rfl: 1 .  albuterol (PROVENTIL HFA;VENTOLIN HFA) 108 (90 Base) MCG/ACT inhaler, Inhale 1-2 puffs into the lungs every 6 (six) hours as needed for wheezing or shortness of breath. (Patient not taking: Reported on 02/15/2019), Disp: 1 Inhaler, Rfl: 0   Allergies  Allergen Reactions  . Hydrocodone-Acetaminophen Nausea And Vomiting     Review of Systems  Constitutional: Negative.   HENT: Negative.   Eyes: Negative.   Respiratory: Negative.   Cardiovascular: Negative.   Gastrointestinal: Negative.   Endocrine: Negative.   Genitourinary: Negative.   Musculoskeletal: Negative.   Skin: Negative.   Allergic/Immunologic: Negative.   Neurological: Negative.   Hematological: Negative.   Psychiatric/Behavioral: Negative.      Today's Vitals   02/15/19 1439  BP: 114/70  Pulse: 81  Temp: 98.5 F (36.9 C)  TempSrc: Oral  Weight: 240 lb 3.2 oz (109 kg)  Height: 5\' 7"  (1.702 m)   Body mass index is 37.62 kg/m.   Objective:  Physical Exam Constitutional:      General: She is not  in acute distress.    Appearance: Normal appearance. She is well-developed. She is obese.  HENT:     Head: Normocephalic and atraumatic.     Right Ear: Hearing, tympanic membrane, ear canal and external ear normal.     Left Ear: Hearing, tympanic membrane, ear canal and external ear normal.  Eyes:     General: Lids are normal.     Conjunctiva/sclera: Conjunctivae normal.     Pupils: Pupils are equal, round, and reactive to light.     Funduscopic exam:    Right eye: No papilledema.        Left eye: No papilledema.   Neck:     Musculoskeletal: Full passive range of motion without pain, normal range of motion and neck supple.     Thyroid: No thyroid mass.     Vascular: No carotid bruit.  Cardiovascular:     Rate and Rhythm: Normal rate and regular rhythm.     Pulses: Normal pulses.     Heart sounds: Normal heart sounds. No murmur.  Pulmonary:     Effort: Pulmonary effort is normal.     Breath sounds: Normal breath sounds.  Abdominal:     General: Abdomen is flat. Bowel sounds are normal.     Palpations: Abdomen is soft.  Musculoskeletal: Normal range of motion.        General: No swelling.     Right lower leg: No edema.     Left lower leg: No edema.  Skin:    General: Skin is warm and dry.     Capillary Refill: Capillary refill takes less than 2 seconds.  Neurological:     General: No focal deficit present.     Mental Status: She is alert and oriented to person, place, and time.     Cranial Nerves: No cranial nerve deficit.     Sensory: No sensory deficit.  Psychiatric:        Mood and Affect: Mood normal.        Behavior: Behavior normal.        Thought Content: Thought content normal.        Judgment: Judgment normal.         Assessment And Plan:     1. Health maintenance examination . Behavior modifications discussed and diet history reviewed.   . Pt will continue to exercise regularly and modify diet with low GI, plant based foods and decrease intake of processed foods.  . Recommend intake of daily multivitamin, Vitamin D, and calcium.  . Recommend mammogram for preventive screenings, as well as recommend immunizations that include , TDAP - BMP8+Anion Gap  2. Essential hypertension . B/P is well controlled.  Marland Kitchen BMP ordered to check renal function.  . The importance of regular exercise and dietary modification was stressed to the patient.  . Stressed importance of losing ten percent of her body weight to help with B/P control.  . The weight loss would help with decreasing  cardiac and cancer risk as well.  - POCT Urinalysis Dipstick (81002) - POCT UA - Microalbumin - BMP8+Anion Gap - CBC no Diff - Vitamin D (25 hydroxy)  3. Class 2 obesity due to excess calories without serious comorbidity with body mass index (BMI) of 37.0 to 37.9 in adult  Chronic, she has had some weight gain and would like to try a weight loss medication to help with appetite suppression  Discussed healthy diet and regular exercise options   Encouraged to exercise at least 150  minutes per week with 2 days of strength training   Return in 2 months for weight check. - Lipid Profile - phentermine 15 MG capsule; Take 1 capsule (15 mg total) by mouth every morning.  Dispense: 30 capsule; Refill: 1  4. Body mass index (BMI) of 37.0-37.9 in adult   5. Situational depression  Current pandemic has her feeling less motivated and saddened  She is currently going through a transition at work  Does not want to start any medications or go to a therapist at this time  6. Chronic pain of right knee  Will order a knee xray since this has been ongoing  Negative drawer test - DG Knee Complete 4 Views Right; Future  7. Vitamin D deficiency  Will check vitamin D level and supplement as needed.     Also encouraged to spend 15 minutes in the sun daily.  - Vitamin D (25 hydroxy)  [name], FNP    THE PATIENT IS ENCOURAGED TO PRACTICE SOCIAL DISTANCING DUE TO THE COVID-19 PANDEMIC.

## 2019-02-16 LAB — BMP8+ANION GAP
Anion Gap: 11 mmol/L (ref 10.0–18.0)
BUN/Creatinine Ratio: 13 (ref 9–23)
BUN: 10 mg/dL (ref 6–24)
CO2: 24 mmol/L (ref 20–29)
Calcium: 9.2 mg/dL (ref 8.7–10.2)
Chloride: 105 mmol/L (ref 96–106)
Creatinine, Ser: 0.78 mg/dL (ref 0.57–1.00)
GFR calc Af Amer: 109 mL/min/{1.73_m2} (ref >59)
GFR calc non Af Amer: 95 mL/min/{1.73_m2} (ref >59)
Glucose: 93 mg/dL (ref 65–99)
Potassium: 3.9 mmol/L (ref 3.5–5.2)
Sodium: 140 mmol/L (ref 134–144)

## 2019-02-16 LAB — LIPID PANEL
Chol/HDL Ratio: 3.2 ratio (ref 0.0–4.4)
Cholesterol, Total: 214 mg/dL — ABNORMAL HIGH (ref 100–199)
HDL: 67 mg/dL (ref >39)
LDL Calculated: 133 mg/dL — ABNORMAL HIGH (ref 0–99)
Triglycerides: 72 mg/dL (ref 0–149)
VLDL Cholesterol Cal: 14 mg/dL (ref 5–40)

## 2019-02-16 LAB — CBC
Hematocrit: 36.2 % (ref 34.0–46.6)
Hemoglobin: 11.9 g/dL (ref 11.1–15.9)
MCH: 26.2 pg — ABNORMAL LOW (ref 26.6–33.0)
MCHC: 32.9 g/dL (ref 31.5–35.7)
MCV: 80 fL (ref 79–97)
Platelets: 322 10*3/uL (ref 150–450)
RBC: 4.54 x10E6/uL (ref 3.77–5.28)
RDW: 13.2 % (ref 11.7–15.4)
WBC: 7.5 10*3/uL (ref 3.4–10.8)

## 2019-02-16 LAB — VITAMIN D 25 HYDROXY (VIT D DEFICIENCY, FRACTURES): Vit D, 25-Hydroxy: 28 ng/mL — ABNORMAL LOW (ref 30.0–100.0)

## 2019-02-17 MED ORDER — VITAMIN D (ERGOCALCIFEROL) 1.25 MG (50000 UNIT) PO CAPS: 50000.0000 [IU] | ORAL_CAPSULE | ORAL | 0 refills | Status: DC

## 2019-02-23 ENCOUNTER — Encounter: Payer: Self-pay | Admitting: Nurse Practitioner

## 2019-02-26 ENCOUNTER — Other Ambulatory Visit: Payer: Self-pay

## 2019-02-26 ENCOUNTER — Ambulatory Visit
Admission: RE | Admit: 2019-02-26 | Discharge: 2019-02-26 | Disposition: A | Discharge status: Home / Self Care | Payer: 59 | Source: Ambulatory Visit | Attending: Nurse Practitioner | Admitting: Nurse Practitioner

## 2019-02-26 DIAGNOSIS — M25561 Pain in right knee: Secondary | ICD-10-CM

## 2019-02-26 DIAGNOSIS — G8929 Other chronic pain: Secondary | ICD-10-CM

## 2019-03-01 ENCOUNTER — Encounter: Payer: Self-pay | Admitting: Nurse Practitioner

## 2019-03-03 ENCOUNTER — Encounter: Payer: Self-pay | Admitting: Nurse Practitioner

## 2019-03-04 ENCOUNTER — Other Ambulatory Visit: Payer: Self-pay | Admitting: Nurse Practitioner

## 2019-03-04 MED ORDER — DICLOFENAC SODIUM 1 % TD GEL: 2.0000 g | Freq: Four times a day (QID) | TRANSDERMAL | 1 refills | Status: DC

## 2019-03-27 ENCOUNTER — Other Ambulatory Visit: Payer: Self-pay | Admitting: Nurse Practitioner

## 2019-04-08 ENCOUNTER — Other Ambulatory Visit: Payer: Self-pay | Admitting: Obstetrics and Gynecology

## 2019-04-08 DIAGNOSIS — Z1231 Encounter for screening mammogram for malignant neoplasm of breast: Secondary | ICD-10-CM

## 2019-04-22 ENCOUNTER — Ambulatory Visit (INDEPENDENT_AMBULATORY_CARE_PROVIDER_SITE_OTHER): Payer: 59 | Admitting: Nurse Practitioner

## 2019-04-22 ENCOUNTER — Other Ambulatory Visit: Payer: Self-pay

## 2019-04-22 ENCOUNTER — Encounter: Payer: Self-pay | Admitting: Nurse Practitioner

## 2019-04-22 VITALS — BP 118/80 | HR 79 | Temp 98.4°F | Ht 67.0 in | Wt 239.8 lb

## 2019-04-22 DIAGNOSIS — M79651 Pain in right thigh: Secondary | ICD-10-CM | POA: Diagnosis not present

## 2019-04-22 DIAGNOSIS — E6609 Other obesity due to excess calories: Secondary | ICD-10-CM

## 2019-04-22 DIAGNOSIS — Z6837 Body mass index (BMI) 37.0-37.9, adult: Secondary | ICD-10-CM | POA: Diagnosis not present

## 2019-04-22 MED ORDER — PHENTERMINE HCL 37.5 MG PO CAPS: 37.5000 mg | ORAL_CAPSULE | ORAL | 1 refills | Status: DC

## 2019-04-22 NOTE — Patient Instructions (Signed)
Obesity, Adult Obesity is having too much body fat. Being obese means that your weight is more than what is healthy for you. BMI is a number that explains how much body fat you have. If you have a BMI of 30 or more, you are obese. Obesity is often caused by eating or drinking more calories than your body uses. Changing your lifestyle can help you lose weight. Obesity can cause serious health problems, such as:  Stroke.  Coronary artery disease (CAD).  Type 2 diabetes.  Some types of cancer, including cancers of the colon, breast, uterus, and gallbladder.  Osteoarthritis.  High blood pressure (hypertension).  High cholesterol.  Sleep apnea.  Gallbladder stones.  Infertility problems. What are the causes?  Eating meals each day that are high in calories, sugar, and fat.  Being born with genes that may make you more likely to become obese.  Having a medical condition that causes obesity.  Taking certain medicines.  Sitting a lot (having a sedentary lifestyle).  Not getting enough sleep.  Drinking a lot of drinks that have sugar in them. What increases the risk?  Having a family history of obesity.  Being an African American woman.  Being a Hispanic man.  Living in an area with limited access to: ? Parks, recreation centers, or sidewalks. ? Healthy food choices, such as grocery stores and farmers' markets. What are the signs or symptoms? The main sign is having too much body fat. How is this treated?  Treatment for this condition often includes changing your lifestyle. Treatment may include: ? Changing your diet. This may include making a healthy meal plan. ? Exercise. This may include activity that causes your heart to beat faster (aerobic exercise) and strength training. Work with your doctor to design a program that works for you. ? Medicine to help you lose weight. This may be used if you are not able to lose 1 pound a week after 6 weeks of healthy eating and  more exercise. ? Treating conditions that cause the obesity. ? Surgery. Options may include gastric banding and gastric bypass. This may be done if:  Other treatments have not helped to improve your condition.  You have a BMI of 40 or higher.  You have life-threatening health problems related to obesity. Follow these instructions at home: Eating and drinking   Follow advice from your doctor about what to eat and drink. Your doctor may tell you to: ? Limit fast food, sweets, and processed snack foods. ? Choose low-fat options. For example, choose low-fat milk instead of whole milk. ? Eat 5 or more servings of fruits or vegetables each day. ? Eat at home more often. This gives you more control over what you eat. ? Choose healthy foods when you eat out. ? Learn to read food labels. This will help you learn how much food is in 1 serving. ? Keep low-fat snacks available. ? Avoid drinks that have a lot of sugar in them. These include soda, fruit juice, iced tea with sugar, and flavored milk.  Drink enough water to keep your pee (urine) pale yellow.  Do not go on fad diets. Physical activity  Exercise often, as told by your doctor. Most adults should get up to 150 minutes of moderate-intensity exercise every week.Ask your doctor: ? What types of exercise are safe for you. ? How often you should exercise.  Warm up and stretch before being active.  Do slow stretching after being active (cool down).  Rest between   times of being active. Lifestyle  Work with your doctor and a food expert (dietitian) to set a weight-loss goal that is best for you.  Limit your screen time.  Find ways to reward yourself that do not involve food.  Do not drink alcohol if: ? Your doctor tells you not to drink. ? You are pregnant, may be pregnant, or are planning to become pregnant.  If you drink alcohol: ? Limit how much you use to:  0-1 drink a day for women.  0-2 drinks a day for men. ? Be  aware of how much alcohol is in your drink. In the [place] one drink equals one 12 oz bottle of beer (355 mL), one 5 oz glass of wine (148 mL), or one 1 oz glass of hard liquor (44 mL). General instructions  Keep a weight-loss journal. This can help you keep track of: ? The food that you eat. ? How much exercise you get.  Take over-the-counter and prescription medicines only as told by your doctor.  Take vitamins and supplements only as told by your doctor.  Think about joining a support group.  Keep all follow-up visits as told by your doctor. This is important. Contact a doctor if:  You cannot meet your weight loss goal after you have changed your diet and lifestyle for 6 weeks. Get help right away if you:  Are having trouble breathing.  Are having thoughts of harming yourself. Summary  Obesity is having too much body fat.  Being obese means that your weight is more than what is healthy for you.  Work with your doctor to set a weight-loss goal.  Get regular exercise as told by your doctor. This information is not intended to replace advice given to you by your health care provider. Make sure you discuss any questions you have with your health care provider. Document Released: 10/14/2011 Document Revised: 03/26/2018 Document Reviewed: 03/26/2018 Elsevier Patient Education  2020 [employer]. Exercising to Lose Weight Exercise is structured, repetitive physical activity to improve fitness and health. Getting regular exercise is important for everyone. It is especially important if you are overweight. Being overweight increases your risk of heart disease, stroke, diabetes, high blood pressure, and several types of cancer. Reducing your calorie intake and exercising can help you lose weight. Exercise is usually categorized as moderate or vigorous intensity. To lose weight, most people need to do a certain amount of moderate-intensity or vigorous-intensity exercise each week.  Moderate-intensity exercise  Moderate-intensity exercise is any activity that gets you moving enough to burn at least three times more energy (calories) than if you were sitting. Examples of moderate exercise include:  Walking a mile in 15 minutes.  Doing light yard work.  Biking at an easy pace. Most people should get at least 150 minutes (2 hours and 30 minutes) a week of moderate-intensity exercise to maintain their body weight. Vigorous-intensity exercise Vigorous-intensity exercise is any activity that gets you moving enough to burn at least six times more calories than if you were sitting. When you exercise at this intensity, you should be working hard enough that you are not able to carry on a conversation. Examples of vigorous exercise include:  Running.  Playing a team sport, such as football, basketball, and soccer.  Jumping rope. Most people should get at least 75 minutes (1 hour and 15 minutes) a week of vigorous-intensity exercise to maintain their body weight. How can exercise affect me? When you exercise enough to burn more calories   than you eat, you lose weight. Exercise also reduces body fat and builds muscle. The more muscle you have, the more calories you burn. Exercise also:  Improves mood.  Reduces stress and tension.  Improves your overall fitness, flexibility, and endurance.  Increases bone strength. The amount of exercise you need to lose weight depends on:  Your age.  The type of exercise.  Any health conditions you have.  Your overall physical ability. Talk to your health care provider about how much exercise you need and what types of activities are safe for you. What actions can I take to lose weight? Nutrition   Make changes to your diet as told by your health care provider or diet and nutrition specialist (dietitian). This may include: ? Eating fewer calories. ? Eating more protein. ? Eating less unhealthy fats. ? Eating a diet that  includes fresh fruits and vegetables, whole grains, low-fat dairy products, and lean protein. ? Avoiding foods with added fat, salt, and sugar.  Drink plenty of water while you exercise to prevent dehydration or heat stroke. Activity  Choose an activity that you enjoy and set realistic goals. Your health care provider can help you make an exercise plan that works for you.  Exercise at a moderate or vigorous intensity most days of the week. ? The intensity of exercise may vary from person to person. You can tell how intense a workout is for you by paying attention to your breathing and heartbeat. Most people will notice their breathing and heartbeat get faster with more intense exercise.  Do resistance training twice each week, such as: ? Push-ups. ? Sit-ups. ? Lifting weights. ? Using resistance bands.  Getting short amounts of exercise can be just as helpful as long structured periods of exercise. If you have trouble finding time to exercise, try to include exercise in your daily routine. ? Get up, stretch, and walk around every 30 minutes throughout the day. ? Go for a walk during your lunch break. ? Park your car farther away from your destination. ? If you take public transportation, get off one stop early and walk the rest of the way. ? Make phone calls while standing up and walking around. ? Take the stairs instead of elevators or escalators.  Wear comfortable clothes and shoes with good support.  Do not exercise so much that you hurt yourself, feel dizzy, or get very short of breath. Where to find more information  [place] Department of Health and Human Services: www.hhs.gov  Centers for Disease Control and Prevention (CDC): www.cdc.gov Contact a health care provider:  Before starting a new exercise program.  If you have questions or concerns about your weight.  If you have a medical problem that keeps you from exercising. Get help right away if you have any of the  following while exercising:  Injury.  Dizziness.  Difficulty breathing or shortness of breath that does not go away when you stop exercising.  Chest pain.  Rapid heartbeat. Summary  Being overweight increases your risk of heart disease, stroke, diabetes, high blood pressure, and several types of cancer.  Losing weight happens when you burn more calories than you eat.  Reducing the amount of calories you eat in addition to getting regular moderate or vigorous exercise each week helps you lose weight. This information is not intended to replace advice given to you by your health care provider. Make sure you discuss any questions you have with your health care provider. Document Released: 08/24/2010 Document   Revised: 08/04/2017 Document Reviewed: 08/04/2017 Elsevier Patient Education  2020 Elsevier Inc.  

## 2019-04-22 NOTE — Progress Notes (Signed)
Subjective:     Patient ID: [name] , female    DOB: 06-12-1976 , 43 y.o.   MRN: [id number]   Chief Complaint  Patient presents with  . Weight Check    patient presents today for a 2 month recheck. patient is on phentermine and she stated it is not working.    HPI  Here for weight check - she was taking phentermine 15 mg daily but stopped due to feeling like not working.    Wt Readings from Last 3 Encounters: 04/22/19 : 239 lb 12.8 oz (108.8 kg) 02/15/19 : 240 lb 3.2 oz (109 kg) 10/12/18 : 233 lb (105.7 kg)  She is attempting exercise daily since July.  She has not been back since July due to right leg pain.  She had a decrease in appetite initially with the phentermine however that did not last long.   Leg Pain  Pain location: right thigh area. The quality of the pain is described as aching. The pain is moderate. Associated symptoms include an inability to bear weight. Pertinent negatives include no loss of sensation, muscle weakness or numbness.     Past Medical History:  Diagnosis Date  . Acute bronchitis 09/11/2018  . Acute dermatitis 09/11/2018  . Acute maxillary sinusitis 09/11/2018  . Acute urinary tract infection 09/11/2018  . Anemia   . 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 Hyperlipidemia    patient denies  . Hypertension   . Mass    on rt inner thigh  . Trichomonal vaginitis 09/11/2018     Family History  Problem Relation Age of Onset  . COPD Mother   . Healthy Father      Current Outpatient Medications:  .  diclofenac sodium (VOLTAREN) 1 % GEL, Apply 2 g topically 4 (four) times daily., Disp: 100 g, Rfl: 1 .  NUVARING 0.12-0.015 MG/24HR vaginal ring, Place 1 application vaginally every 30 (thirty) days., Disp: , Rfl: 11 .  olmesartan-hydrochlorothiazide (BENICAR HCT) 40-25 MG tablet, TAKE 1 TABLET BY MOUTH EVERY DAY, Disp: 90 tablet, Rfl: 1 .  Vitamin D, Ergocalciferol, (DRISDOL) 1.25 MG (50000 UT) CAPS capsule, Take 1 capsule (50,000 Units total)  by mouth every 7 (seven) days., Disp: 12 capsule, Rfl: 0 .  acetaminophen (TYLENOL) 500 MG tablet, Take 1,000 mg by mouth every 6 (six) hours as needed for headache., Disp: , Rfl:  .  albuterol (PROVENTIL HFA;VENTOLIN HFA) 108 (90 Base) MCG/ACT inhaler, Inhale 1-2 puffs into the lungs every 6 (six) hours as needed for wheezing or shortness of breath. (Patient not taking: Reported on 02/15/2019), Disp: 1 Inhaler, Rfl: 0 .  phentermine 15 MG capsule, Take 1 capsule (15 mg total) by mouth every morning. (Patient not taking: Reported on 04/22/2019), Disp: 30 capsule, Rfl: 1   Allergies  Allergen Reactions  . Hydrocodone-Acetaminophen Nausea And Vomiting     Review of Systems  Constitutional: Negative.   Respiratory: Negative.   Cardiovascular: Negative.  Negative for chest pain, palpitations and leg swelling.  Musculoskeletal: Positive for arthralgias. Negative for neck pain.  Neurological: Negative for dizziness and numbness.  Hematological: Negative.      Today's Vitals   04/22/19 1417  BP: 118/80  Pulse: 79  Temp: 98.4 F (36.9 C)  TempSrc: Oral  Weight: 239 lb 12.8 oz (108.8 kg)  Height: 5\' 7"  (1.702 m)  PainSc: 6   PainLoc: Leg   Body mass index is 37.56 kg/m.   Objective:  Physical Exam Constitutional:  General: She is not in acute distress.    Appearance: Normal appearance.  HENT:     Head: Normocephalic and atraumatic.  Cardiovascular:     Rate and Rhythm: Normal rate and regular rhythm.     Pulses: Normal pulses.     Heart sounds: Normal heart sounds. No murmur.  Pulmonary:     Effort: Pulmonary effort is normal. No respiratory distress.     Breath sounds: Normal breath sounds.  Skin:    General: Skin is warm and dry.     Capillary Refill: Capillary refill takes less than 2 seconds.  Neurological:     General: No focal deficit present.     Mental Status: She is alert and oriented to person, place, and time.  Psychiatric:        Mood and Affect: Mood  normal.        Behavior: Behavior normal.        Thought Content: Thought content normal.        Judgment: Judgment normal.         Assessment And Plan:     1. Class 2 obesity due to excess calories without serious comorbidity with body mass index (BMI) of 37.0 to 37.9 in adult Chronic, she has been taking phentermine 15mg  with minimal effect with her appetite. Her weight is decreased by 1lb.   Discussed healthy diet and regular exercise options which may be limited by her right leg pain.  Encouraged to exercise at least 150 minutes per week with 2 days of strength training - phentermine 37.5 MG capsule; Take 1 capsule (37.5 mg total) by mouth every morning.  Dispense: 30 capsule; Refill: 1 - DG Hip Unilat W OR W/O Pelvis Min 4 Views Left; Future  2. Right thigh pain  Continues to have right leg pain  I will check a hip xray to evaluate for structural damage that could be causing referred pain  I will also check femur xray due to the pain is in this area - DG Hip Unilat W OR W/O Pelvis Min 4 Views Left; Future - DG FEMUR 1V RIGHT; Future  [name], FNP    THE PATIENT IS ENCOURAGED TO PRACTICE SOCIAL DISTANCING DUE TO THE COVID-19 PANDEMIC.

## 2019-05-19 ENCOUNTER — Encounter: Payer: Self-pay | Admitting: Nurse Practitioner

## 2019-05-20 ENCOUNTER — Telehealth: Payer: Self-pay

## 2019-05-20 NOTE — Telephone Encounter (Signed)
I called patient to notify her, her flma forms have been completed and will be placed up front for her. YRL,RMA

## 2019-05-21 ENCOUNTER — Other Ambulatory Visit: Payer: Self-pay | Admitting: Radiology

## 2019-05-21 DIAGNOSIS — N644 Mastodynia: Secondary | ICD-10-CM

## 2019-05-22 ENCOUNTER — Other Ambulatory Visit: Payer: Self-pay | Admitting: Nurse Practitioner

## 2019-05-27 ENCOUNTER — Ambulatory Visit
Admission: RE | Admit: 2019-05-27 | Discharge: 2019-05-27 | Disposition: A | Discharge status: Home / Self Care | Payer: 59 | Source: Ambulatory Visit | Attending: Radiology | Admitting: Radiology

## 2019-05-27 ENCOUNTER — Other Ambulatory Visit: Payer: Self-pay

## 2019-05-27 ENCOUNTER — Other Ambulatory Visit: Payer: Self-pay | Admitting: Radiology

## 2019-05-27 DIAGNOSIS — N644 Mastodynia: Secondary | ICD-10-CM

## 2019-05-27 DIAGNOSIS — N6310 Unspecified lump in the right breast, unspecified quadrant: Secondary | ICD-10-CM

## 2019-05-28 ENCOUNTER — Ambulatory Visit
Admission: RE | Admit: 2019-05-28 | Discharge: 2019-05-28 | Disposition: A | Discharge status: Home / Self Care | Payer: 59 | Source: Ambulatory Visit | Attending: Radiology | Admitting: Radiology

## 2019-05-28 DIAGNOSIS — N6310 Unspecified lump in the right breast, unspecified quadrant: Secondary | ICD-10-CM

## 2019-05-29 ENCOUNTER — Encounter: Payer: Self-pay | Admitting: Nurse Practitioner

## 2019-05-31 ENCOUNTER — Other Ambulatory Visit: Payer: Self-pay | Admitting: Nurse Practitioner

## 2019-05-31 ENCOUNTER — Other Ambulatory Visit: Payer: Self-pay | Admitting: Obstetrics and Gynecology

## 2019-05-31 DIAGNOSIS — D241 Benign neoplasm of right breast: Secondary | ICD-10-CM

## 2019-05-31 DIAGNOSIS — N63 Unspecified lump in unspecified breast: Secondary | ICD-10-CM

## 2019-05-31 DIAGNOSIS — E6609 Other obesity due to excess calories: Secondary | ICD-10-CM

## 2019-05-31 MED ORDER — PHENTERMINE HCL 37.5 MG PO TABS: 37.5000 mg | ORAL_TABLET | Freq: Every day | ORAL | 0 refills | Status: DC

## 2019-06-02 ENCOUNTER — Other Ambulatory Visit: Payer: Self-pay | Admitting: Obstetrics and Gynecology

## 2019-06-02 DIAGNOSIS — D369 Benign neoplasm, unspecified site: Secondary | ICD-10-CM

## 2019-06-03 ENCOUNTER — Other Ambulatory Visit: Payer: Self-pay | Admitting: Obstetrics and Gynecology

## 2019-06-03 ENCOUNTER — Ambulatory Visit: Payer: 59

## 2019-06-03 ENCOUNTER — Ambulatory Visit
Admission: RE | Admit: 2019-06-03 | Discharge: 2019-06-03 | Disposition: A | Discharge status: Home / Self Care | Payer: 59 | Source: Ambulatory Visit | Attending: Obstetrics and Gynecology | Admitting: Obstetrics and Gynecology

## 2019-06-03 ENCOUNTER — Other Ambulatory Visit: Payer: Self-pay

## 2019-06-03 DIAGNOSIS — D241 Benign neoplasm of right breast: Secondary | ICD-10-CM

## 2019-06-07 ENCOUNTER — Ambulatory Visit
Admission: RE | Admit: 2019-06-07 | Discharge: 2019-06-07 | Disposition: A | Discharge status: Home / Self Care | Payer: 59 | Source: Ambulatory Visit | Attending: Obstetrics and Gynecology | Admitting: Obstetrics and Gynecology

## 2019-06-07 ENCOUNTER — Other Ambulatory Visit: Payer: Self-pay

## 2019-06-07 DIAGNOSIS — D241 Benign neoplasm of right breast: Secondary | ICD-10-CM

## 2019-06-08 ENCOUNTER — Encounter: Payer: Self-pay | Admitting: Nurse Practitioner

## 2019-06-11 ENCOUNTER — Other Ambulatory Visit: Payer: Self-pay | Admitting: Surgery

## 2019-06-14 ENCOUNTER — Other Ambulatory Visit: Payer: Self-pay | Admitting: Surgery

## 2019-06-14 DIAGNOSIS — D241 Benign neoplasm of right breast: Secondary | ICD-10-CM

## 2019-06-28 ENCOUNTER — Ambulatory Visit (INDEPENDENT_AMBULATORY_CARE_PROVIDER_SITE_OTHER): Payer: 59 | Admitting: Nurse Practitioner

## 2019-06-28 ENCOUNTER — Encounter: Payer: Self-pay | Admitting: Nurse Practitioner

## 2019-06-28 ENCOUNTER — Other Ambulatory Visit: Payer: Self-pay

## 2019-06-28 VITALS — BP 114/70 | HR 84 | Temp 98.6°F | Ht 67.0 in | Wt 231.8 lb

## 2019-06-28 DIAGNOSIS — E6609 Other obesity due to excess calories: Secondary | ICD-10-CM | POA: Diagnosis not present

## 2019-06-28 DIAGNOSIS — R928 Other abnormal and inconclusive findings on diagnostic imaging of breast: Secondary | ICD-10-CM

## 2019-06-28 DIAGNOSIS — Z6837 Body mass index (BMI) 37.0-37.9, adult: Secondary | ICD-10-CM

## 2019-06-28 MED ORDER — PHENTERMINE HCL 37.5 MG PO TABS: 37.5000 mg | ORAL_TABLET | Freq: Every day | ORAL | 0 refills | Status: DC

## 2019-06-28 NOTE — Progress Notes (Signed)
Subjective:     Patient ID: [name] , female    DOB: 1976/03/26 , 43 y.o.   MRN: [id number]   Chief Complaint  Patient presents with  . Weight Check    HPI  Here for weight check - she was taking phentermine 15 mg daily but stopped due to feeling like not working.    Wt Readings from Last 3 Encounters: 06/28/19 : 231 lb 12.8 oz (105.1 kg) 04/22/19 : 239 lb 12.8 oz (108.8 kg) 02/15/19 : 240 lb 3.2 oz (109 kg)  She has an MRI of right breast papilloma tomorrow and plan for surgery this year. No family history of breast cancer.  She will likely be down for 2 weeks.  GYN - Dr. Mikey Kirschner at [facility].     Past Medical History:  Diagnosis Date  . Acute bronchitis 09/11/2018  . Acute dermatitis 09/11/2018  . Acute maxillary sinusitis 09/11/2018  . Acute urinary tract infection 09/11/2018  . Anemia   . 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 Hyperlipidemia    patient denies  . Hypertension   . Mass    on rt inner thigh  . Trichomonal vaginitis 09/11/2018     Family History  Problem Relation Age of Onset  . COPD Mother   . Healthy Father      Current Outpatient Medications:  .  acetaminophen (TYLENOL) 500 MG tablet, Take 1,000 mg by mouth every 6 (six) hours as needed for headache., Disp: , Rfl:  .  diclofenac sodium (VOLTAREN) 1 % GEL, Apply 2 g topically 4 (four) times daily., Disp: 100 g, Rfl: 1 .  NUVARING 0.12-0.015 MG/24HR vaginal ring, Place 1 application vaginally every 30 (thirty) days., Disp: , Rfl: 11 .  olmesartan-hydrochlorothiazide (BENICAR HCT) 40-25 MG tablet, TAKE 1 TABLET BY MOUTH EVERY DAY, Disp: 90 tablet, Rfl: 1 .  phentermine (ADIPEX-P) 37.5 MG tablet, Take 1 tablet (37.5 mg total) by mouth daily before breakfast., Disp: 30 tablet, Rfl: 0 .  Vitamin D, Ergocalciferol, (DRISDOL) 1.25 MG (50000 UT) CAPS capsule, TAKE 1 CAPSULE (50,000 UNITS TOTAL) BY MOUTH EVERY 7 (SEVEN) DAYS., Disp: 12 capsule, Rfl: 0 .  albuterol (PROVENTIL HFA;VENTOLIN  HFA) 108 (90 Base) MCG/ACT inhaler, Inhale 1-2 puffs into the lungs every 6 (six) hours as needed for wheezing or shortness of breath. (Patient not taking: Reported on 02/15/2019), Disp: 1 Inhaler, Rfl: 0   Allergies  Allergen Reactions  . Hydrocodone-Acetaminophen Nausea And Vomiting     Review of Systems  Constitutional: Negative.  Negative for fatigue.  Respiratory: Negative.   Cardiovascular: Negative.  Negative for chest pain, palpitations and leg swelling.  Musculoskeletal: Positive for arthralgias. Negative for neck pain.  Neurological: Negative for dizziness and headaches.  Hematological: Negative.      Today's Vitals   06/28/19 1414  BP: 114/70  Pulse: 84  Temp: 98.6 F (37 C)  TempSrc: Oral  Weight: 231 lb 12.8 oz (105.1 kg)  Height: 5\' 7"  (1.702 m)  PainSc: 0-No pain   Body mass index is 36.31 kg/m.   Objective:  Physical Exam Constitutional:      General: She is not in acute distress.    Appearance: Normal appearance. She is obese.  HENT:     Head: Normocephalic and atraumatic.  Cardiovascular:     Rate and Rhythm: Normal rate and regular rhythm.     Pulses: Normal pulses.     Heart sounds: Normal heart sounds. No murmur.  Pulmonary:  Effort: Pulmonary effort is normal. No respiratory distress.     Breath sounds: Normal breath sounds.  Skin:    General: Skin is warm and dry.     Capillary Refill: Capillary refill takes less than 2 seconds.  Neurological:     General: No focal deficit present.     Mental Status: She is alert and oriented to person, place, and time.  Psychiatric:        Mood and Affect: Mood normal.        Behavior: Behavior normal.        Thought Content: Thought content normal.        Judgment: Judgment normal.         Assessment And Plan:     1. Class 2 obesity due to excess calories without serious comorbidity with body mass index (BMI) of 37.0 to 37.9 in adult Chronic, congratulated on her 8lb weight loss Continue taking  phentermine as directed. Discussed healthy diet and regular exercise options which may be limited by her  Encouraged to at least walk 15 minutes per day - phentermine (ADIPEX-P) 37.5 MG tablet; Take 1 tablet (37.5 mg total) by mouth daily before breakfast.  Dispense: 30 tablet; Refill: 0  2. Abnormal mammogram of right breast  She is planning to have an MRI of her right breast tomorrow and possible surgery by the end of the year after having breast discharge one morning.  Her GYN is Dr. Mikey Kirschner.    [name], FNP    THE PATIENT IS ENCOURAGED TO PRACTICE SOCIAL DISTANCING DUE TO THE COVID-19 PANDEMIC.

## 2019-06-28 NOTE — Patient Instructions (Signed)
GOOD JOB ON YOUR WEIGHT LOSS!!

## 2019-06-29 ENCOUNTER — Ambulatory Visit
Admission: RE | Admit: 2019-06-29 | Discharge: 2019-06-29 | Disposition: A | Discharge status: Home / Self Care | Payer: 59 | Source: Ambulatory Visit | Attending: Surgery | Admitting: Surgery

## 2019-06-29 DIAGNOSIS — D241 Benign neoplasm of right breast: Secondary | ICD-10-CM

## 2019-06-29 MED ORDER — GADOBUTROL 1 MMOL/ML IV SOLN
10.0000 mL | Freq: Once | INTRAVENOUS | Status: AC | PRN
  Administered 2019-06-29: 12:00:00 10 mL via INTRAVENOUS

## 2019-07-06 ENCOUNTER — Other Ambulatory Visit: Payer: Self-pay | Admitting: Surgery

## 2019-07-06 DIAGNOSIS — R9389 Abnormal findings on diagnostic imaging of other specified body structures: Secondary | ICD-10-CM

## 2019-07-13 ENCOUNTER — Other Ambulatory Visit (HOSPITAL_COMMUNITY): Payer: Self-pay | Admitting: Diagnostic Radiology

## 2019-07-13 ENCOUNTER — Ambulatory Visit
Admission: RE | Admit: 2019-07-13 | Discharge: 2019-07-13 | Disposition: A | Discharge status: Home / Self Care | Payer: 59 | Source: Ambulatory Visit | Attending: Surgery | Admitting: Surgery

## 2019-07-13 ENCOUNTER — Other Ambulatory Visit: Payer: Self-pay

## 2019-07-13 ENCOUNTER — Other Ambulatory Visit: Payer: Self-pay | Admitting: Surgery

## 2019-07-13 DIAGNOSIS — R9389 Abnormal findings on diagnostic imaging of other specified body structures: Secondary | ICD-10-CM

## 2019-07-13 MED ORDER — GADOBUTROL 1 MMOL/ML IV SOLN
10.0000 mL | Freq: Once | INTRAVENOUS | Status: AC | PRN
  Administered 2019-07-13: 10 mL via INTRAVENOUS

## 2019-07-15 ENCOUNTER — Other Ambulatory Visit: Payer: Self-pay | Admitting: Surgery

## 2019-07-15 ENCOUNTER — Ambulatory Visit: Payer: Self-pay | Admitting: Nurse Practitioner

## 2019-07-15 DIAGNOSIS — N6021 Fibroadenosis of right breast: Secondary | ICD-10-CM

## 2019-07-15 DIAGNOSIS — D241 Benign neoplasm of right breast: Secondary | ICD-10-CM

## 2019-07-23 ENCOUNTER — Other Ambulatory Visit: Payer: Self-pay | Admitting: Nurse Practitioner

## 2019-07-24 ENCOUNTER — Encounter: Payer: Self-pay | Admitting: Nurse Practitioner

## 2019-07-26 ENCOUNTER — Other Ambulatory Visit: Payer: Self-pay

## 2019-07-26 ENCOUNTER — Telehealth (INDEPENDENT_AMBULATORY_CARE_PROVIDER_SITE_OTHER): Payer: 59 | Admitting: Nurse Practitioner

## 2019-07-26 ENCOUNTER — Encounter: Payer: Self-pay | Admitting: Nurse Practitioner

## 2019-07-26 DIAGNOSIS — R05 Cough: Secondary | ICD-10-CM | POA: Diagnosis not present

## 2019-07-26 DIAGNOSIS — R0602 Shortness of breath: Secondary | ICD-10-CM | POA: Diagnosis not present

## 2019-07-26 DIAGNOSIS — U071 COVID-19: Secondary | ICD-10-CM | POA: Diagnosis not present

## 2019-07-26 DIAGNOSIS — R059 Cough, unspecified: Secondary | ICD-10-CM

## 2019-07-26 MED ORDER — HYDROCODONE-HOMATROPINE 5-1.5 MG/5ML PO SYRP: 5.0000 mL | ORAL_SOLUTION | Freq: Four times a day (QID) | ORAL | 0 refills | Status: DC | PRN

## 2019-07-26 MED ORDER — AZITHROMYCIN 500 MG PO TABS: 500.0000 mg | ORAL_TABLET | Freq: Every day | ORAL | 0 refills | Status: AC

## 2019-07-26 NOTE — Progress Notes (Signed)
Virtual Visit via video   This visit type was conducted due to national recommendations for restrictions regarding the COVID-19 Pandemic (e.g. social distancing) in an effort to limit this patient's exposure and mitigate transmission in our community.  Due to her co-morbid illnesses, this patient is at least at moderate risk for complications without adequate follow up.  This format is felt to be most appropriate for this patient at this time.  All issues noted in this document were discussed and addressed.  A limited physical exam was performed with this format.    This visit type was conducted due to national recommendations for restrictions regarding the COVID-19 Pandemic (e.g. social distancing) in an effort to limit this patient's exposure and mitigate transmission in our community.  Patients identity confirmed using two different identifiers.  This format is felt to be most appropriate for this patient at this time.  All issues noted in this document were discussed and addressed.  No physical exam was performed (except for noted visual exam findings with Video Visits).    Date:  07/31/2019   ID:  [name], DOB 09-15-1975, MRN [id number]  Patient Location:  Home - spoke with [name]  Provider location:   Office   Chief Complaint:  Positive for coronavirus  History of Present Illness:    [name] is a 43 y.o. female who presents via video conferencing for a telehealth visit today.    The patient does have symptoms concerning for COVID-19 infection (fever, chills, cough, or new shortness of breath).   She had fever,   Saturday evening went [facility] on [address], started on last Tuesday had fatigue, sore throat, cough (dry hacking), fever up to 103. Since she tested positive. She lost her sense of smell/taste on Saturday 97-98.  Reports shortness of breath has used albuterol inhaler approx 100 pumps since last week.  Worse with exertion.   Headache all day today. Yesterday increased watery eyes and runny nose.  She is taking alka seltzer cold plus.  She is unsure of where she contracted.  She pumped gas and ate pretzels.      Past Medical History:  Diagnosis Date  . Acute bronchitis 09/11/2018  . Acute calculous cholecystitis 09/11/2018  . Acute dermatitis 09/11/2018  . Acute maxillary sinusitis 09/11/2018  . Acute urinary tract infection 09/11/2018  . Anemia   . 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 Hyperlipidemia    patient denies  . Hypertension   . Mass    on rt inner thigh  . Trichomonal vaginitis 09/11/2018   Past Surgical History:  Procedure Laterality Date  . BREAST EXCISIONAL BIOPSY Left 2003   papiloma removed  . BREAST SURGERY     left breast papilloma  . CESAREAN SECTION  07/11/03  . LAPAROSCOPIC CHOLECYSTECTOMY SINGLE SITE WITH INTRAOPERATIVE CHOLANGIOGRAM N/A 09/11/2018   Procedure: LAPAROSCOPIC CHOLECYSTECTOMY SINGLE SITE WITH INTRAOPERATIVE CHOLANGIOGRAM;  Surgeon: Michael Boston, MD;  Location: [facility];  Service: General;  Laterality: N/A;  . MASS EXCISION  05/21/05   inner lt thigh  . MYOMECTOMY N/A 07/20/2015   Procedure: MYOMECTOMY, ABDOMINAL;  Surgeon: Arvella Nigh, MD;  Location: [facility];  Service: Gynecology;  Laterality: N/A;  . WISDOM TOOTH EXTRACTION       Current Meds  Medication Sig  . acetaminophen (TYLENOL) 500 MG tablet Take 1,000 mg by mouth every 6 (six) hours as needed for headache.  . diclofenac sodium (VOLTAREN) 1 % GEL Apply  2 g topically 4 (four) times daily.  Marland Kitchen NUVARING 0.12-0.015 MG/24HR vaginal ring Place 1 application vaginally every 30 (thirty) days.  Marland Kitchen olmesartan-hydrochlorothiazide (BENICAR HCT) 40-25 MG tablet TAKE 1 TABLET BY MOUTH EVERY DAY     Allergies:   Hydrocodone-acetaminophen   Social History   Tobacco Use  . Smoking status: Never Smoker  . Smokeless tobacco: Never Used  Substance Use Topics  . Alcohol use: No  . Drug use: No     Family Hx: The  patient's family history includes COPD in her mother; Healthy in her father.  ROS:   Please see the history of present illness.    Review of Systems  Constitutional: Negative.   Respiratory: Negative.  Negative for cough, shortness of breath and wheezing.   Cardiovascular: Negative.   Neurological: Negative for dizziness and tingling.    All other systems reviewed and are negative.   Labs/Other Tests and Data Reviewed:    Recent Labs: 10/05/2018: ALT 57 02/15/2019: BUN 10; Creatinine, Ser 0.78; Hemoglobin 11.9; Platelets 322; Potassium 3.9; Sodium 140   Recent Lipid Panel Lab Results  Component Value Date/Time   CHOL 214 (H) 02/15/2019 03:57 PM   TRIG 72 02/15/2019 03:57 PM   HDL 67 02/15/2019 03:57 PM   CHOLHDL 3.2 02/15/2019 03:57 PM   LDLCALC 133 (H) 02/15/2019 03:57 PM    Wt Readings from Last 3 Encounters:  06/28/19 231 lb 12.8 oz (105.1 kg)  04/22/19 239 lb 12.8 oz (108.8 kg)  02/15/19 240 lb 3.2 oz (109 kg)     Exam:    Vital Signs:  LMP 07/22/2019 (Approximate)     Physical Exam  Constitutional: She is oriented to person, place, and time and well-developed, well-nourished, and in no distress. No distress.  Neurological: She is alert and oriented to person, place, and time.  Psychiatric: Mood, memory, affect and judgment normal.    ASSESSMENT & PLAN:    1. Cough  Related to covid, will send Rx for hydromet if her coughing worsens otherwise can take Delsym over the counter - HYDROcodone-homatropine (HYDROMET) 5-1.5 MG/5ML syrup; Take 5 mLs by mouth every 6 (six) hours as needed for cough.  Dispense: 120 mL; Refill: 0  2. Shortness of breath  Intermittent shortness of breath with exertion will treat with azithromycin due to her previous history of pneumonia - azithromycin (ZITHROMAX) 500 MG tablet; Take 1 tablet (500 mg total) by mouth daily for 3 days. Take 1 tablet daily for 3 days.  Dispense: 6 tablet; Refill: 0 - HYDROcodone-homatropine (HYDROMET) 5-1.5  MG/5ML syrup; Take 5 mLs by mouth every 6 (six) hours as needed for cough.  Dispense: 120 mL; Refill: 0  3. Clinical diagnosis of COVID-19  Diagnosed with covid 19 on Saturday after having a fever and not feeling well   COVID-19 Education: The signs and symptoms of COVID-19 were discussed with the patient and how to seek care for testing (follow up with PCP or arrange E-visit).  The importance of social distancing was discussed today.  Patient Risk:   After full review of this patients clinical status, I feel that they are at least moderate risk at this time.  Time:   Today, I have spent 13 minutes/ seconds with the patient with telehealth technology discussing above diagnoses.     Medication Adjustments/Labs and Tests Ordered: Current medicines are reviewed at length with the patient today.  Concerns regarding medicines are outlined above.   Tests Ordered: No orders of the defined types were  placed in this encounter.   Medication Changes: Meds ordered this encounter  Medications  . azithromycin (ZITHROMAX) 500 MG tablet    Sig: Take 1 tablet (500 mg total) by mouth daily for 3 days. Take 1 tablet daily for 3 days.    Dispense:  6 tablet    Refill:  0  . HYDROcodone-homatropine (HYDROMET) 5-1.5 MG/5ML syrup    Sig: Take 5 mLs by mouth every 6 (six) hours as needed for cough.    Dispense:  120 mL    Refill:  0    Disposition:  Follow up prn  Signed, [name], FNP

## 2019-08-04 ENCOUNTER — Ambulatory Visit: Payer: 59 | Attending: Internal Medicine

## 2019-08-04 DIAGNOSIS — Z20822 Contact with and (suspected) exposure to covid-19: Secondary | ICD-10-CM

## 2019-08-05 LAB — NOVEL CORONAVIRUS, NAA: SARS-CoV-2, NAA: NOT DETECTED

## 2019-08-09 ENCOUNTER — Encounter: Payer: Self-pay | Admitting: Nurse Practitioner

## 2019-08-10 ENCOUNTER — Telehealth: Payer: Self-pay

## 2019-08-10 NOTE — Telephone Encounter (Signed)
Left vm for pt to return call. Need to talk to her about her short term disability

## 2019-08-11 ENCOUNTER — Other Ambulatory Visit: Payer: Self-pay

## 2019-08-11 ENCOUNTER — Encounter: Payer: Self-pay | Admitting: Nurse Practitioner

## 2019-08-11 MED ORDER — PREDNISONE 10 MG (21) PO TBPK: ORAL_TABLET | ORAL | 0 refills | Status: DC

## 2019-08-18 ENCOUNTER — Other Ambulatory Visit: Payer: Self-pay | Admitting: Nurse Practitioner

## 2019-08-20 ENCOUNTER — Encounter: Payer: Self-pay | Admitting: Nurse Practitioner

## 2019-08-23 ENCOUNTER — Encounter: Payer: Self-pay | Admitting: Nurse Practitioner

## 2019-08-23 NOTE — Telephone Encounter (Signed)
Please send a work note extension to return to work on Feb 1st

## 2019-08-25 ENCOUNTER — Ambulatory Visit (INDEPENDENT_AMBULATORY_CARE_PROVIDER_SITE_OTHER): Payer: BC Managed Care – PPO | Admitting: Nurse Practitioner

## 2019-08-25 ENCOUNTER — Encounter: Payer: Self-pay | Admitting: Nurse Practitioner

## 2019-08-25 ENCOUNTER — Other Ambulatory Visit: Payer: Self-pay

## 2019-08-25 VITALS — BP 128/80 | HR 122 | Temp 99.1°F | Wt 235.0 lb

## 2019-08-25 DIAGNOSIS — Z8616 Personal history of COVID-19: Secondary | ICD-10-CM | POA: Diagnosis not present

## 2019-08-25 DIAGNOSIS — R059 Cough, unspecified: Secondary | ICD-10-CM

## 2019-08-25 DIAGNOSIS — R0689 Other abnormalities of breathing: Secondary | ICD-10-CM | POA: Diagnosis not present

## 2019-08-25 DIAGNOSIS — R05 Cough: Secondary | ICD-10-CM

## 2019-08-25 DIAGNOSIS — R000 Tachycardia, unspecified: Secondary | ICD-10-CM

## 2019-08-25 MED ORDER — BENZONATATE 100 MG PO CAPS: 100.0000 mg | ORAL_CAPSULE | Freq: Four times a day (QID) | ORAL | 1 refills | Status: DC | PRN

## 2019-08-25 MED ORDER — CEFTRIAXONE SODIUM 1 G IJ SOLR
1.0000 g | Freq: Once | INTRAMUSCULAR | Status: AC
  Administered 2019-08-25: 17:00:00 1 g via INTRAMUSCULAR

## 2019-08-25 MED ORDER — ALBUTEROL SULFATE HFA 108 (90 BASE) MCG/ACT IN AERS: 1.0000 | INHALATION_SPRAY | Freq: Four times a day (QID) | RESPIRATORY_TRACT | 2 refills | Status: DC | PRN

## 2019-08-25 NOTE — Progress Notes (Signed)
This visit occurred during the SARS-CoV-2 public health emergency.  Safety protocols were in place, including screening questions prior to the visit, additional usage of staff PPE, and extensive cleaning of exam room while observing appropriate contact time as indicated for disinfecting solutions.  Subjective:     Patient ID: [name] , female    DOB: [birth year] , 44 y.o.   MRN: [id number]   Chief Complaint  Patient presents with  . covid  . Weight Check    HPI  She is here today for a follow up from being positive for covid over 3 weeks ago.  She was treated with azithromycin and prednisone, continues to have a cough.  Continues to have chills, fatigue and intermittent dizziness.  She is also having thigh and leg aches.      Past Medical History:  Diagnosis Date  . Acute bronchitis 09/11/2018  . Acute calculous cholecystitis 09/11/2018  . Acute dermatitis 09/11/2018  . Acute maxillary sinusitis 09/11/2018  . Acute urinary tract infection 09/11/2018  . Anemia   . 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 Hyperlipidemia    patient denies  . Hypertension   . Mass    on rt inner thigh  . Trichomonal vaginitis 09/11/2018     Family History  Problem Relation Age of Onset  . COPD Mother   . Healthy Father      Current Outpatient Medications:  .  acetaminophen (TYLENOL) 500 MG tablet, Take 1,000 mg by mouth every 6 (six) hours as needed for headache., Disp: , Rfl:  .  diclofenac sodium (VOLTAREN) 1 % GEL, Apply 2 g topically 4 (four) times daily., Disp: 100 g, Rfl: 1 .  Ferrous Sulfate (IRON PO), Take by mouth. Take 1 tablet daily, Disp: , Rfl:  .  olmesartan-hydrochlorothiazide (BENICAR HCT) 40-25 MG tablet, TAKE 1 TABLET BY MOUTH EVERY DAY, Disp: 90 tablet, Rfl: 1 .  predniSONE (STERAPRED UNI-PAK 21 TAB) 10 MG (21) TBPK tablet, Take as directed, Disp: 1 each, Rfl: 0 .  Vitamin D, Ergocalciferol, (DRISDOL) 1.25 MG (50000 UNIT) CAPS capsule, TAKE 1 CAPSULE (50,000 UNITS  TOTAL) BY MOUTH EVERY 7 (SEVEN) DAYS., Disp: 12 capsule, Rfl: 0 .  albuterol (PROVENTIL HFA;VENTOLIN HFA) 108 (90 Base) MCG/ACT inhaler, Inhale 1-2 puffs into the lungs every 6 (six) hours as needed for wheezing or shortness of breath. (Patient not taking: Reported on 02/15/2019), Disp: 1 Inhaler, Rfl: 0 .  HYDROcodone-homatropine (HYDROMET) 5-1.5 MG/5ML syrup, Take 5 mLs by mouth every 6 (six) hours as needed for cough. (Patient not taking: Reported on 08/25/2019), Disp: 120 mL, Rfl: 0 .  NUVARING 0.12-0.015 MG/24HR vaginal ring, Place 1 application vaginally every 30 (thirty) days., Disp: , Rfl: 11 .  phentermine (ADIPEX-P) 37.5 MG tablet, Take 1 tablet (37.5 mg total) by mouth daily before breakfast. (Patient not taking: Reported on 07/26/2019), Disp: 30 tablet, Rfl: 0   Allergies  Allergen Reactions  . Hydrocodone-Acetaminophen Nausea And Vomiting     Review of Systems  Constitutional: Positive for chills. Negative for fever.  HENT: Negative for congestion, sinus pain and sore throat.   Respiratory: Positive for cough. Negative for chest tightness and shortness of breath.   Cardiovascular: Negative.  Negative for chest pain, palpitations and leg swelling.  Musculoskeletal: Positive for arthralgias.  Skin: Positive for color change (bilateral shins with darkened areas).  Neurological: Positive for dizziness. Negative for headaches.  Psychiatric/Behavioral: Negative.      Today's Vitals   08/25/19 1611  BP:  128/80  Pulse: (!) 122  Temp: 99.1 F (37.3 C)  SpO2: 96%  Weight: 235 lb (106.6 kg)  PainSc: 4    Body mass index is 36.81 kg/m.   Objective:  Physical Exam Constitutional:      Appearance: Normal appearance.  Cardiovascular:     Rate and Rhythm: Normal rate and regular rhythm.     Pulses: Normal pulses.     Heart sounds: Normal heart sounds. No murmur.  Pulmonary:     Effort: Pulmonary effort is normal. No respiratory distress.     Breath sounds: Examination of the  right-upper field reveals decreased breath sounds. Examination of the left-upper field reveals decreased breath sounds. Examination of the right-lower field reveals decreased breath sounds. Examination of the left-lower field reveals decreased breath sounds. Decreased breath sounds present. No wheezing.  Neurological:     General: No focal deficit present.     Mental Status: She is alert and oriented to person, place, and time.  Psychiatric:        Mood and Affect: Mood normal.        Behavior: Behavior normal.        Thought Content: Thought content normal.        Judgment: Judgment normal.         Assessment And Plan:     1. Cough  Will give her tessalon perles as the hydromet is causing her nausea   Also sent a prescription for albuterol inhaler  I will give her a rocephin injection as she has decreased breath sounds throughout, I am concerned she may have pneumonia.   I did talk to her about driving home as she is weak, she does not have anyone to pick her up.   I will refer her to [facility], form was faxed  She need to remain out of work until 09/25/2019.   Pending her visit from [facility] will consider doing a CXR.   - albuterol (VENTOLIN HFA) 108 (90 Base) MCG/ACT inhaler; Inhale 1-2 puffs into the lungs every 6 (six) hours as needed for wheezing or shortness of breath.  Dispense: 18 g; Refill: 2 - benzonatate (TESSALON PERLES) 100 MG capsule; Take 1 capsule (100 mg total) by mouth every 6 (six) hours as needed.  Dispense: 30 capsule; Refill: 1 - cefTRIAXone (ROCEPHIN) injection 1 g  2. History of COVID-19  Negative covid test 3 weeks ago   She needs to remain out of work until 09/25/2019 - cefTRIAXone (ROCEPHIN) injection 1 g  3. Tachycardia  May be related to dehydration vs infection, her blood pressure is good.  I will obtain kidney functions and CBC to evaluate for infection - BMP8+Anion Gap - CBC with Diff   [name], FNP    THE PATIENT IS  ENCOURAGED TO PRACTICE SOCIAL DISTANCING DUE TO THE COVID-19 PANDEMIC.

## 2019-08-25 NOTE — Patient Instructions (Signed)
I am going to have [facility] who will come and see you at home.

## 2019-08-26 LAB — CBC WITH DIFFERENTIAL/PLATELET
Basophils Absolute: 0 10*3/uL (ref 0.0–0.2)
Basos: 0 %
EOS (ABSOLUTE): 0.1 10*3/uL (ref 0.0–0.4)
Eos: 1 %
Hematocrit: 37.4 % (ref 34.0–46.6)
Hemoglobin: 12.3 g/dL (ref 11.1–15.9)
Immature Grans (Abs): 0.1 10*3/uL (ref 0.0–0.1)
Immature Granulocytes: 1 %
Lymphocytes Absolute: 3.4 10*3/uL — ABNORMAL HIGH (ref 0.7–3.1)
Lymphs: 36 %
MCH: 27.1 pg (ref 26.6–33.0)
MCHC: 32.9 g/dL (ref 31.5–35.7)
MCV: 82 fL (ref 79–97)
Monocytes Absolute: 0.9 10*3/uL (ref 0.1–0.9)
Monocytes: 10 %
Neutrophils Absolute: 4.9 10*3/uL (ref 1.4–7.0)
Neutrophils: 52 %
Platelets: 314 10*3/uL (ref 150–450)
RBC: 4.54 x10E6/uL (ref 3.77–5.28)
RDW: 14.1 % (ref 11.7–15.4)
WBC: 9.4 10*3/uL (ref 3.4–10.8)

## 2019-08-26 LAB — BMP8+ANION GAP
Anion Gap: 11 mmol/L (ref 10.0–18.0)
BUN/Creatinine Ratio: 19 (ref 9–23)
BUN: 14 mg/dL (ref 6–24)
CO2: 24 mmol/L (ref 20–29)
Calcium: 9.3 mg/dL (ref 8.7–10.2)
Chloride: 104 mmol/L (ref 96–106)
Creatinine, Ser: 0.75 mg/dL (ref 0.57–1.00)
GFR calc Af Amer: 114 mL/min/{1.73_m2} (ref >59)
GFR calc non Af Amer: 99 mL/min/{1.73_m2} (ref >59)
Glucose: 97 mg/dL (ref 65–99)
Potassium: 4.2 mmol/L (ref 3.5–5.2)
Sodium: 139 mmol/L (ref 134–144)

## 2019-08-31 ENCOUNTER — Ambulatory Visit: Payer: Self-pay | Admitting: Nurse Practitioner

## 2019-09-06 ENCOUNTER — Telehealth: Payer: Self-pay

## 2019-09-06 NOTE — Telephone Encounter (Signed)
Left vm for pt to return call. Need to know how pt is doing

## 2019-09-13 ENCOUNTER — Telehealth: Payer: Self-pay | Admitting: Nurse Practitioner

## 2019-09-13 ENCOUNTER — Other Ambulatory Visit: Payer: Self-pay | Admitting: Nurse Practitioner

## 2019-09-13 DIAGNOSIS — E6609 Other obesity due to excess calories: Secondary | ICD-10-CM

## 2019-09-13 DIAGNOSIS — Z6837 Body mass index (BMI) 37.0-37.9, adult: Secondary | ICD-10-CM

## 2019-09-13 MED ORDER — PHENTERMINE HCL 37.5 MG PO TABS: 37.5000 mg | ORAL_TABLET | Freq: Every day | ORAL | 1 refills | Status: DC

## 2019-09-13 NOTE — Telephone Encounter (Signed)
Called to check on patient she is doing better.  Continues to have nasal congestion. Advised to take an over the counter steroid nasal spray.  Her blood pressure has been running [id number] systolic.  She wants to restart her phentermine, she will need to follow up in 2 months.

## 2019-09-20 ENCOUNTER — Telehealth: Payer: Self-pay

## 2019-09-20 NOTE — Telephone Encounter (Signed)
I called patient to see if she has picked up her phentermine because we have received a PA for it. She stated she has not I advised her that she could go on [employer] and print out the coupon and the medicine would be cheap. She stated she was going to do that now. YRL,RMA

## 2019-09-27 ENCOUNTER — Ambulatory Visit: Payer: BC Managed Care – PPO | Admitting: Nurse Practitioner

## 2019-10-21 ENCOUNTER — Telehealth: Payer: Self-pay

## 2019-11-11 ENCOUNTER — Other Ambulatory Visit: Payer: Self-pay

## 2019-11-11 ENCOUNTER — Encounter: Payer: Self-pay | Admitting: Nurse Practitioner

## 2019-11-11 ENCOUNTER — Ambulatory Visit (INDEPENDENT_AMBULATORY_CARE_PROVIDER_SITE_OTHER): Payer: BC Managed Care – PPO | Admitting: Nurse Practitioner

## 2019-11-11 VITALS — BP 124/78 | HR 84 | Temp 98.4°F | Ht 67.0 in | Wt 234.0 lb

## 2019-11-11 DIAGNOSIS — K5900 Constipation, unspecified: Secondary | ICD-10-CM

## 2019-11-11 DIAGNOSIS — Z6836 Body mass index (BMI) 36.0-36.9, adult: Secondary | ICD-10-CM | POA: Diagnosis not present

## 2019-11-11 DIAGNOSIS — E669 Obesity, unspecified: Secondary | ICD-10-CM | POA: Diagnosis not present

## 2019-11-11 MED ORDER — SAXENDA 18 MG/3ML ~~LOC~~ SOPN: 3.0000 mg | PEN_INJECTOR | Freq: Every day | SUBCUTANEOUS | 1 refills | Status: DC

## 2019-11-11 NOTE — Progress Notes (Signed)
This visit occurred during the SARS-CoV-2 public health emergency.  Safety protocols were in place, including screening questions prior to the visit, additional usage of staff PPE, and extensive cleaning of exam room while observing appropriate contact time as indicated for disinfecting solutions.  Subjective:     Patient ID: [name] , female    DOB: 08/05/1976 , 44 y.o.   MRN: [id number]   Chief Complaint  Patient presents with  . Weight Check    HPI  Wt Readings from Last 3 Encounters: 11/11/19 : 234 lb (106.1 kg) 08/25/19 : 235 lb (106.6 kg) 06/28/19 : 231 lb 12.8 oz (105.1 kg)  She has been without the phentermine for at least one month.  She can tell with her clothes that she has lost weight Constipation This is a chronic problem. The current episode started more than 1 year ago. The problem has been gradually worsening since onset. The patient is on a high fiber diet. She exercises regularly. There has been adequate water intake. Pertinent negatives include no abdominal pain, bloating or nausea. She has tried fiber (she is taking metamucil) for the symptoms. The treatment provided no relief.     Past Medical History:  Diagnosis Date  . Acute bronchitis 09/11/2018  . Acute calculous cholecystitis 09/11/2018  . Acute dermatitis 09/11/2018  . Acute maxillary sinusitis 09/11/2018  . Acute urinary tract infection 09/11/2018  . Anemia   . 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 Hyperlipidemia    patient denies  . Hypertension   . Mass    on rt inner thigh  . Trichomonal vaginitis 09/11/2018     Family History  Problem Relation Age of Onset  . COPD Mother   . Healthy Father      Current Outpatient Medications:  .  acetaminophen (TYLENOL) 500 MG tablet, Take 1,000 mg by mouth every 6 (six) hours as needed for headache., Disp: , Rfl:  .  albuterol (VENTOLIN HFA) 108 (90 Base) MCG/ACT inhaler, Inhale 1-2 puffs into the lungs every 6 (six) hours as needed for wheezing  or shortness of breath., Disp: 18 g, Rfl: 2 .  NUVARING 0.12-0.015 MG/24HR vaginal ring, Place 1 application vaginally every 30 (thirty) days., Disp: , Rfl: 11 .  olmesartan-hydrochlorothiazide (BENICAR HCT) 40-25 MG tablet, TAKE 1 TABLET BY MOUTH EVERY DAY, Disp: 90 tablet, Rfl: 1 .  phentermine (ADIPEX-P) 37.5 MG tablet, Take 1 tablet (37.5 mg total) by mouth daily before breakfast., Disp: 30 tablet, Rfl: 1 .  Vitamin D, Ergocalciferol, (DRISDOL) 1.25 MG (50000 UNIT) CAPS capsule, TAKE 1 CAPSULE (50,000 UNITS TOTAL) BY MOUTH EVERY 7 (SEVEN) DAYS., Disp: 12 capsule, Rfl: 0   Allergies  Allergen Reactions  . Hydrocodone-Acetaminophen Nausea And Vomiting     Review of Systems  Constitutional: Negative.   Respiratory: Negative.   Cardiovascular: Negative.  Negative for chest pain, palpitations and leg swelling.  Gastrointestinal: Positive for constipation. Negative for abdominal pain, bloating and nausea.  Neurological: Negative for dizziness and headaches.  Psychiatric/Behavioral: Negative.      Today's Vitals   11/11/19 1412  BP: 124/78  Pulse: 84  Temp: 98.4 F (36.9 C)  TempSrc: Oral  SpO2: 98%  Weight: 234 lb (106.1 kg)  Height: 5\' 7"  (1.702 m)   Body mass index is 36.65 kg/m.   Objective:  Physical Exam Constitutional:      Appearance: Normal appearance.  Cardiovascular:     Rate and Rhythm: Normal rate and regular rhythm.  Pulses: Normal pulses.     Heart sounds: Normal heart sounds. No murmur.  Pulmonary:     Effort: Pulmonary effort is normal. No respiratory distress.     Breath sounds: Normal breath sounds.  Skin:    Capillary Refill: Capillary refill takes less than 2 seconds.  Neurological:     General: No focal deficit present.     Mental Status: She is alert and oriented to person, place, and time.     Cranial Nerves: No cranial nerve deficit.  Psychiatric:        Mood and Affect: Mood normal.        Behavior: Behavior normal.        Thought  Content: Thought content normal.        Judgment: Judgment normal.         Assessment And Plan:     1. Obesity (BMI 35.0-39.9 without comorbidity)  Chronic,  Chronic Discussed healthy diet and regular exercise options  Encouraged to exercise at least 150 minutes per week with 2 days of strength training Given handout for http://[email or web address].wilson-mendoza.org/ 10 tips, CDC exercise for adults and CDC Eat More Weight Less Will start [place] pending insurance approval she is to titrate weekly, discussed side effects of nausea, abdominal pain or difficulty swallowing to notify office. [employer] teaching done  Return in 2 months for weight check.  Wt Readings from Last 3 Encounters:  11/11/19 234 lb (106.1 kg)  08/25/19 235 lb (106.6 kg)  06/28/19 231 lb 12.8 oz (105.1 kg)    2. Constipation, unspecified constipation type   linzess samples given   [name], FNP    THE PATIENT IS ENCOURAGED TO PRACTICE SOCIAL DISTANCING DUE TO THE COVID-19 PANDEMIC.

## 2019-11-15 ENCOUNTER — Telehealth: Payer: Self-pay

## 2019-11-15 NOTE — Telephone Encounter (Signed)
PA for Kirke Shaggy has been approved through 03/13/20. Pharmacy and patient notified [facility]

## 2019-12-08 ENCOUNTER — Encounter: Payer: Self-pay | Admitting: Nurse Practitioner

## 2019-12-10 ENCOUNTER — Encounter: Payer: Self-pay | Admitting: Nurse Practitioner

## 2020-01-10 ENCOUNTER — Ambulatory Visit (INDEPENDENT_AMBULATORY_CARE_PROVIDER_SITE_OTHER): Payer: BC Managed Care – PPO | Admitting: Nurse Practitioner

## 2020-01-10 ENCOUNTER — Other Ambulatory Visit: Payer: Self-pay

## 2020-01-10 ENCOUNTER — Encounter: Payer: Self-pay | Admitting: Nurse Practitioner

## 2020-01-10 VITALS — BP 120/82 | HR 72 | Temp 98.5°F | Ht 67.0 in | Wt 233.4 lb

## 2020-01-10 DIAGNOSIS — Z6837 Body mass index (BMI) 37.0-37.9, adult: Secondary | ICD-10-CM

## 2020-01-10 DIAGNOSIS — L709 Acne, unspecified: Secondary | ICD-10-CM

## 2020-01-10 DIAGNOSIS — E66812 Obesity, class 2: Secondary | ICD-10-CM

## 2020-01-10 DIAGNOSIS — E669 Obesity, unspecified: Secondary | ICD-10-CM | POA: Diagnosis not present

## 2020-01-10 DIAGNOSIS — E6609 Other obesity due to excess calories: Secondary | ICD-10-CM | POA: Diagnosis not present

## 2020-01-10 MED ORDER — CLINDAMYCIN PHOSPHATE 1 % EX SOLN: Freq: Two times a day (BID) | CUTANEOUS | 0 refills | Status: DC

## 2020-01-10 MED ORDER — PHENTERMINE HCL 37.5 MG PO TABS: 37.5000 mg | ORAL_TABLET | Freq: Every day | ORAL | 1 refills | Status: DC

## 2020-01-10 NOTE — Progress Notes (Signed)
This visit occurred during the SARS-CoV-2 public health emergency.  Safety protocols were in place, including screening questions prior to the visit, additional usage of staff PPE, and extensive cleaning of exam room while observing appropriate contact time as indicated for disinfecting solutions.  Subjective:     Patient ID: [name] , female    DOB: June 18, 1976 , 44 y.o.   MRN: [id number]   Chief Complaint  Patient presents with   Weight Check    HPI  Here for weight check   Wt Readings from Last 3 Encounters: 01/10/20 : 233 lb 6.4 oz (105.9 kg) 11/11/19 : 234 lb (106.1 kg) 08/25/19 : 235 lb (106.6 kg)  She is walking more regularly with her friend.  She is having more acne.   Diet: she is craving sugar. She will eat apples to try to help.       Past Medical History:  Diagnosis Date   Acute bronchitis 09/11/2018   Acute calculous cholecystitis 09/11/2018   Acute dermatitis 09/11/2018   Acute maxillary sinusitis 09/11/2018   Acute urinary tract infection 09/11/2018   Anemia    Herpes simplex of female genitalia 09/11/2018   Hyperlipidemia    patient denies   Hypertension    Mass    on rt inner thigh   Trichomonal vaginitis 09/11/2018     Family History  Problem Relation Age of Onset   COPD Mother    Healthy Father      Current Outpatient Medications:    acetaminophen (TYLENOL) 500 MG tablet, Take 1,000 mg by mouth every 6 (six) hours as needed for headache., Disp: , Rfl:    NUVARING 0.12-0.015 MG/24HR vaginal ring, Place 1 application vaginally every 30 (thirty) days., Disp: , Rfl: 11   olmesartan-hydrochlorothiazide (BENICAR HCT) 40-25 MG tablet, TAKE 1 TABLET BY MOUTH EVERY DAY, Disp: 90 tablet, Rfl: 1   albuterol (VENTOLIN HFA) 108 (90 Base) MCG/ACT inhaler, Inhale 1-2 puffs into the lungs every 6 (six) hours as needed for wheezing or shortness of breath. (Patient not taking: Reported on 01/10/2020), Disp: 18 g, Rfl: 2   Liraglutide -Weight  Management (SAXENDA) 18 MG/3ML SOPN, Inject 0.5 mLs (3 mg total) into the skin daily. (Patient not taking: Reported on 01/10/2020), Disp: 5 pen, Rfl: 1   phentermine (ADIPEX-P) 37.5 MG tablet, Take 1 tablet (37.5 mg total) by mouth daily before breakfast. (Patient not taking: Reported on 01/10/2020), Disp: 30 tablet, Rfl: 1   Vitamin D, Ergocalciferol, (DRISDOL) 1.25 MG (50000 UNIT) CAPS capsule, TAKE 1 CAPSULE (50,000 UNITS TOTAL) BY MOUTH EVERY 7 (SEVEN) DAYS. (Patient not taking: Reported on 01/10/2020), Disp: 12 capsule, Rfl: 0   Allergies  Allergen Reactions   Hydrocodone-Acetaminophen Nausea And Vomiting     Review of Systems  Constitutional: Negative.  Negative for fatigue.  Respiratory: Negative.   Cardiovascular: Negative.  Negative for chest pain, palpitations and leg swelling.  Neurological: Negative for dizziness and headaches.  Psychiatric/Behavioral: Negative.      Today's Vitals   01/10/20 0955  BP: 120/82  Pulse: 72  Temp: 98.5 F (36.9 C)  TempSrc: Oral  Weight: 233 lb 6.4 oz (105.9 kg)  Height: 5\' 7"  (1.702 m)  PainSc: 0-No pain   Body mass index is 36.56 kg/m.   Objective:  Physical Exam Constitutional:      General: She is not in acute distress.    Appearance: Normal appearance.  Cardiovascular:     Rate and Rhythm: Normal rate and regular rhythm.  Pulses: Normal pulses.     Heart sounds: Normal heart sounds. No murmur.  Pulmonary:     Effort: Pulmonary effort is normal. No respiratory distress.     Breath sounds: Normal breath sounds.  Skin:    Capillary Refill: Capillary refill takes less than 2 seconds.  Neurological:     General: No focal deficit present.     Mental Status: She is alert and oriented to person, place, and time.     Cranial Nerves: No cranial nerve deficit.  Psychiatric:        Mood and Affect: Mood normal.        Behavior: Behavior normal.        Thought Content: Thought content normal.        Judgment: Judgment normal.          Assessment And Plan:     1. Acne, unspecified acne type  She has papular vesicles to her face on right and left side on cheeks  May be related to acne or high sugar intake.    2. Obesity (BMI 35.0-39.9 without comorbidity) ASK- Given permission to discuss weight today ASSESS - Body mass index is 36.56 kg/m., well tolerated, WAIST CIRCUMFERENCE 44.5 ADVISE  - has lost 1 lb since her last visit.  Advised to increase her physical activity and be consistent.   AGREE - to increase her physical activity of walking back to walking daily to include during lunch time  ASSIST - given weight loss resource book, feels barriers have been related to just getting off track    [name], [place] DISTANCING DUE TO THE COVID-19 [place].

## 2020-01-17 ENCOUNTER — Telehealth: Payer: Self-pay

## 2020-01-17 NOTE — Telephone Encounter (Signed)
I left pt v/m notifying her that her forms have been completed and are ready for pick up. YL,RMA

## 2020-01-19 IMAGING — MR MR BRAIN/IAC WO/W
12 series · 40 of 48 positions shown · IV contrast (multihance)
Comparison: None.

CLINICAL DATA: Left-sided hearing loss

EXAM:
MRI HEAD WITHOUT AND WITH CONTRAST
TECHNIQUE: Multiplanar, multiecho pulse sequences of the brain and surrounding
structures were obtained without and with intravenous contrast.
CONTRAST:  20mL MULTIHANCE GADOBENATE DIMEGLUMINE 529 MG/ML IV SOLN

[Series 2: T1 · sagittal · 5.0mm · 0.90mm/px · 2 of 27 slices shown (1 of 5)]
[im 1/27]
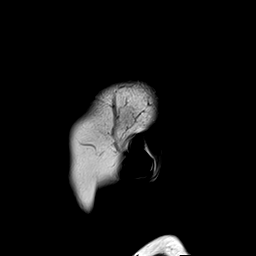
[im 27/27]
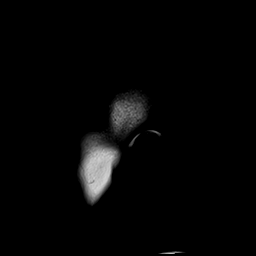

[Series 3: DWI · axial · 3.0mm · 1.88mm/px · z∈[-61,+88]mm · 8 of 92 slices shown (1 of 2)]
[im 1/92]
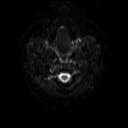
[im 14/92]
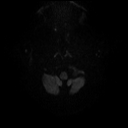
[im 27/92]
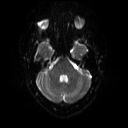
[im 40/92]
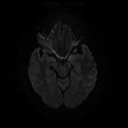
[im 53/92]
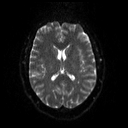
[im 66/92]
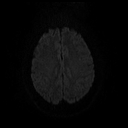
[im 79/92]
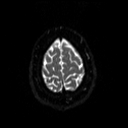
[im 92/92]
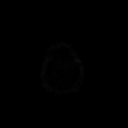

[Series 4: DWI · axial · 3.0mm · 1.88mm/px · z∈[-61,+88]mm · 4 of 46 slices shown (2 of 2)]
[im 1/46]
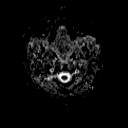
[im 16/46]
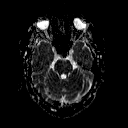
[im 31/46]
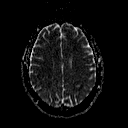
[im 46/46]
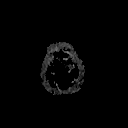

[Series 6: T2 · axial · 5.0mm · 0.69mm/px · z∈[-67,+95]mm · 3 of 28 slices shown (1 of 2)]
[im 1/28]
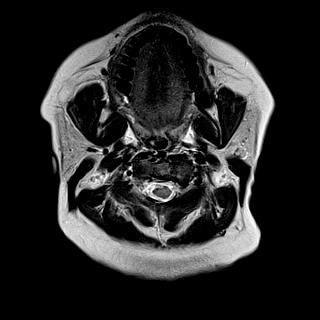
[im 14/28]
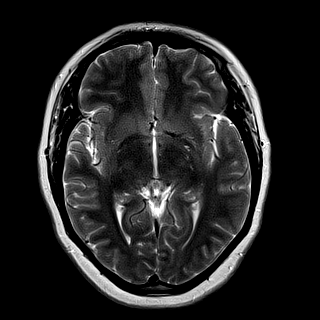
[im 28/28]
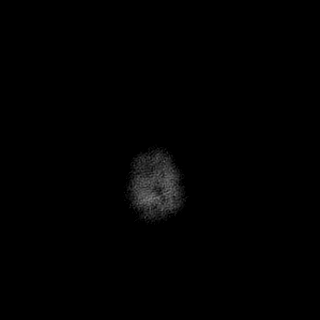

[Series 7: T2 · axial · 5.0mm · 0.43mm/px · z∈[-67,+95]mm · 3 of 28 slices shown (2 of 2)]
[im 1/28]
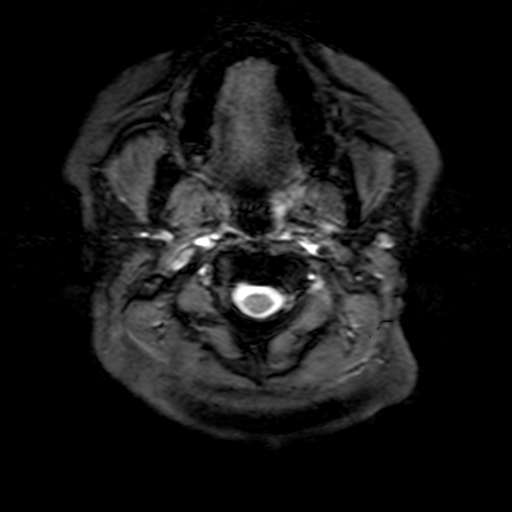
[im 14/28]
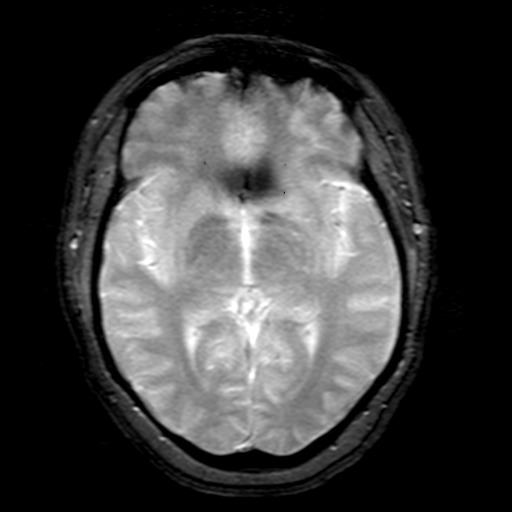
[im 28/28]
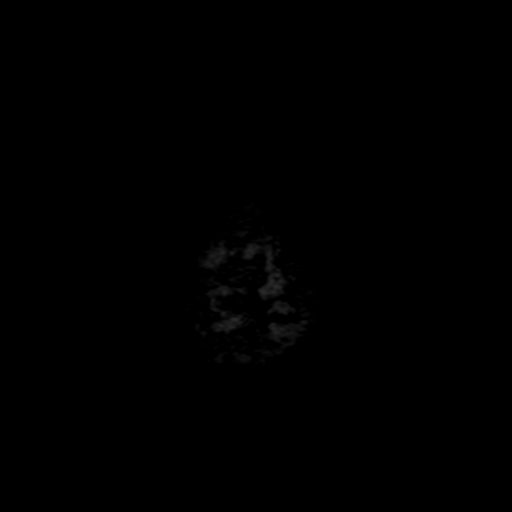

[Series 8: FLAIR · axial · 3.0mm · 0.43mm/px · z∈[-60,+88]mm · 4 of 38 slices shown]
[im 1/38]
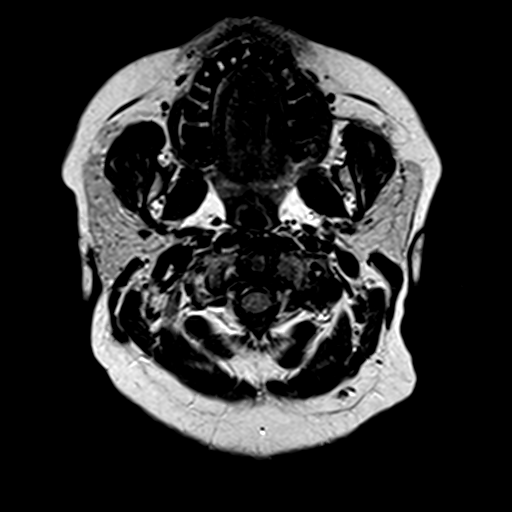
[im 13/38]
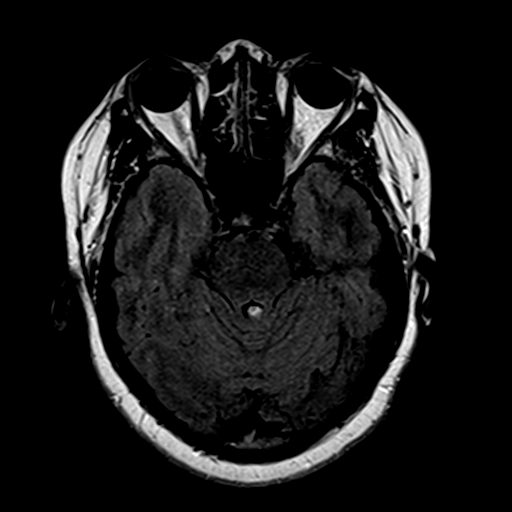
[im 25/38]
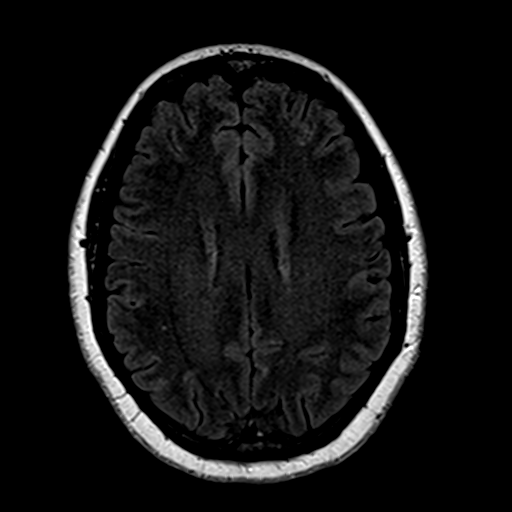
[im 38/38]
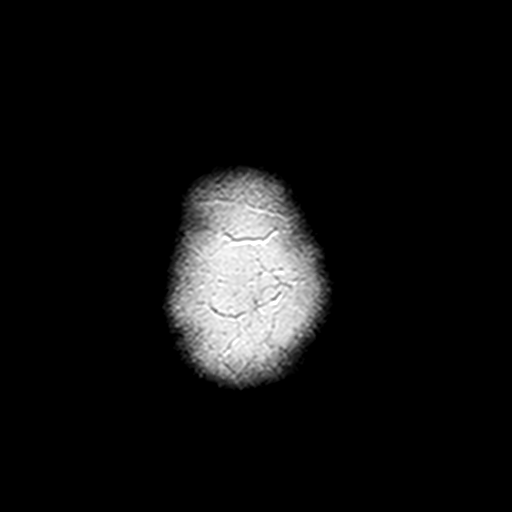

[Series 9: t2_spc_tra_p2_iso · axial · 0.7mm · 0.35mm/px · z∈[-33,+8]mm · 6 of 60 slices shown]
[im 1/60]
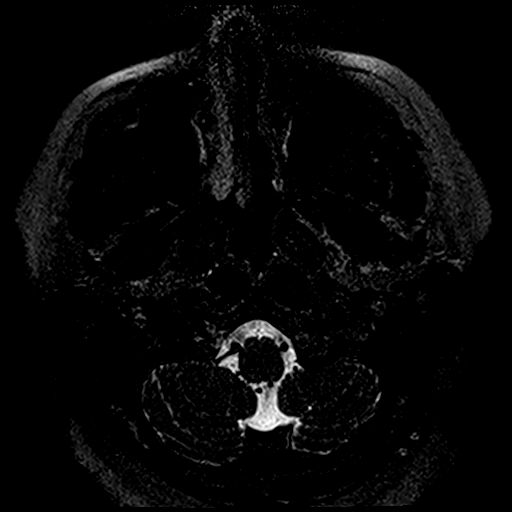
[im 12/60]
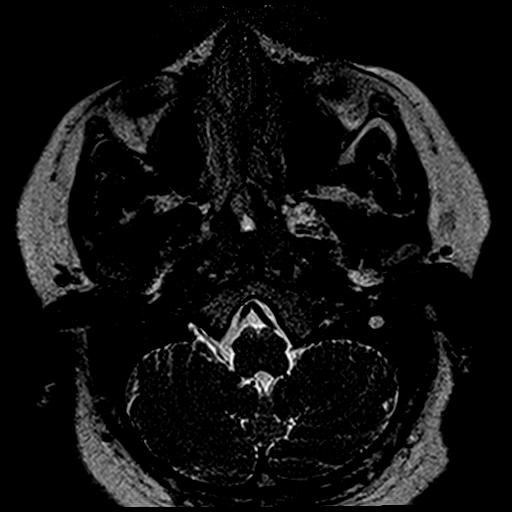
[im 24/60]
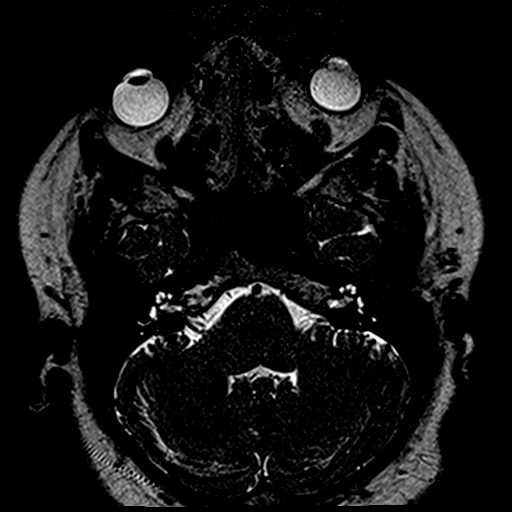
[im 36/60]
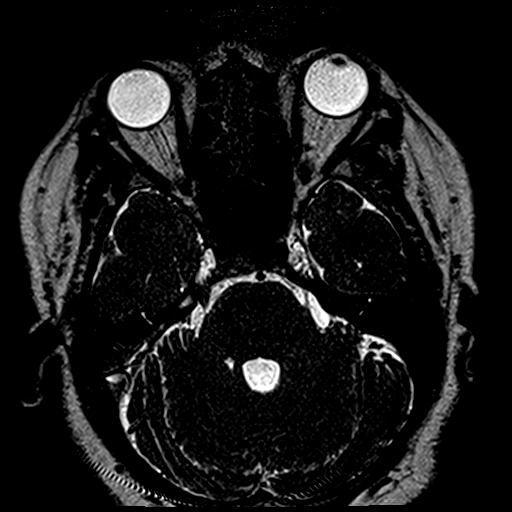
[im 48/60]
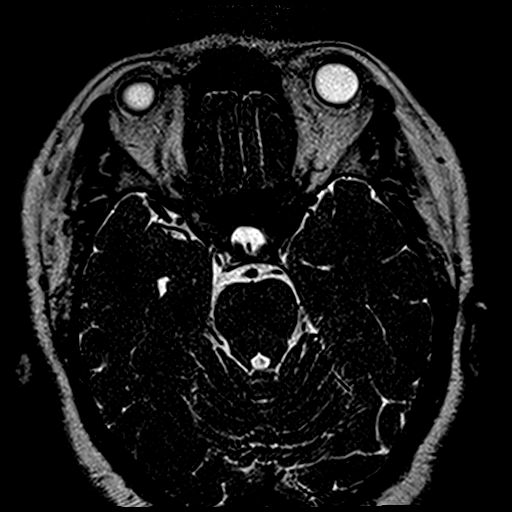
[im 60/60]
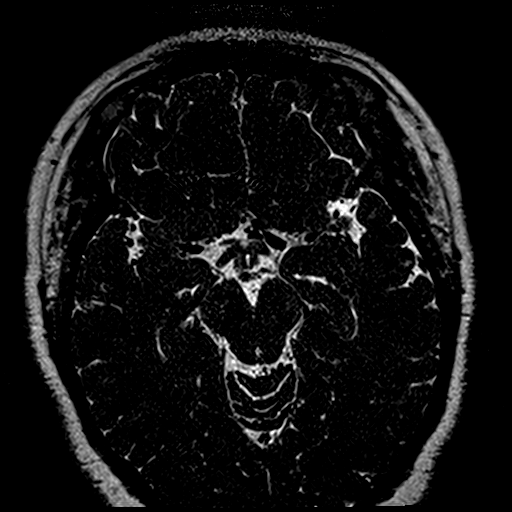

[Series 10: T1 · axial · 3.0mm · 0.70mm/px · z∈[-36,+10]mm · 2 of 15 slices shown (2 of 5)]
[im 1/15]
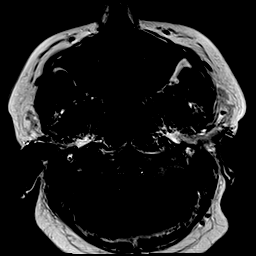
[im 15/15]
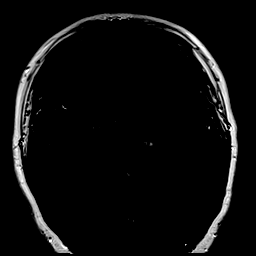

[Series 11: T1 · coronal · 3.0mm · 0.70mm/px · 2 of 15 slices shown (3 of 5)]
[im 1/15]
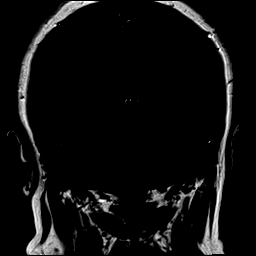
[im 15/15]
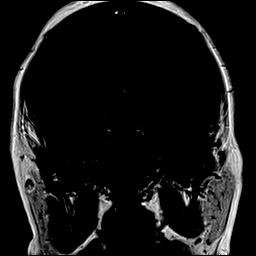

[Series 12: T1 · axial · 3.0mm · 0.70mm/px · z∈[-36,+10]mm · 2 of 15 slices shown (4 of 5)]
[im 1/15]
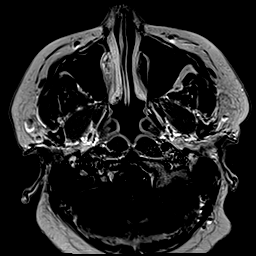
[im 15/15]
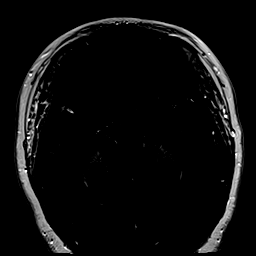

[Series 13: T1 · coronal · 3.0mm · 0.70mm/px · 2 of 15 slices shown (5 of 5)]
[im 1/15]
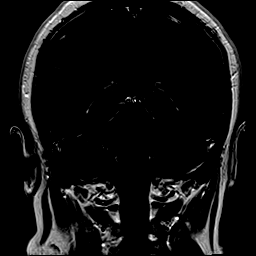
[im 15/15]
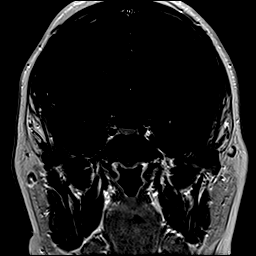

[Series 14: t1_3d_tra +c · axial · 2.0mm · 0.90mm/px · z∈[-80,-60]mm · 2 of 96 slices shown]
[im 1/96]
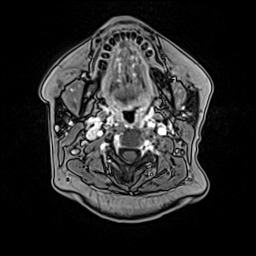
[im 11/96]
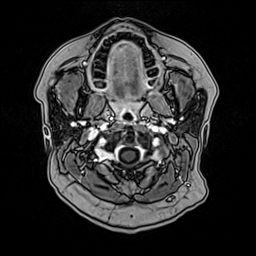

[40 of 48 positions shown; findings below may reference images not displayed]

FINDINGS: BRAIN: There is no acute infarct, acute hemorrhage, hydrocephalus or
extra-axial collection. The midline structures are normal. No
midline shift or other mass effect. There are no old infarcts. The
white matter signal is normal for the patient's age. The cerebral
and cerebellar volume are age-appropriate. Susceptibility-sensitive
sequences show no chronic microhemorrhage or superficial siderosis.
No abnormal contrast enhancement.

INTERNAL AUDITORY CANALS: There is no cerebellopontine angle mass.
The cochleae and semicircular canals are normal. No focal
abnormality along the course of the 7th and 8th cranial nerves.
Normal porus acusticus and vestibular aqueduct bilaterally.

VASCULAR: Major intracranial arterial and venous sinus flow voids
are normal.

SKULL AND UPPER CERVICAL SPINE: Calvarial bone marrow signal is
normal. There is no skull base mass. Visualized upper cervical spine
and soft tissues are normal.

SINUSES/ORBITS: No fluid levels or advanced mucosal thickening. No
mastoid or middle ear effusion. The orbits are normal.
IMPRESSION: Normal MRI of the brain, internal auditory canals and labyrinthine
structures.

## 2020-02-04 ENCOUNTER — Other Ambulatory Visit: Payer: Self-pay | Admitting: Nurse Practitioner

## 2020-02-04 DIAGNOSIS — L709 Acne, unspecified: Secondary | ICD-10-CM

## 2020-02-16 ENCOUNTER — Encounter: Payer: 59 | Admitting: Nurse Practitioner

## 2020-02-18 ENCOUNTER — Encounter: Payer: Self-pay | Admitting: Nurse Practitioner

## 2020-02-26 ENCOUNTER — Other Ambulatory Visit: Payer: Self-pay | Admitting: Nurse Practitioner

## 2020-03-05 ENCOUNTER — Other Ambulatory Visit: Payer: Self-pay | Admitting: Nurse Practitioner

## 2020-03-05 DIAGNOSIS — L709 Acne, unspecified: Secondary | ICD-10-CM

## 2020-03-16 ENCOUNTER — Telehealth: Payer: Self-pay

## 2020-03-16 NOTE — Telephone Encounter (Signed)
Prior auth done for saxenda waiting on a response from the pt's insurance.

## 2020-03-18 ENCOUNTER — Other Ambulatory Visit: Payer: Self-pay | Admitting: Nurse Practitioner

## 2020-03-18 DIAGNOSIS — E6609 Other obesity due to excess calories: Secondary | ICD-10-CM

## 2020-03-20 NOTE — Telephone Encounter (Signed)
Please refill patient's medication she has an appointment on 8/24 [facility]

## 2020-03-28 ENCOUNTER — Encounter: Payer: Self-pay | Admitting: Nurse Practitioner

## 2020-03-28 ENCOUNTER — Other Ambulatory Visit: Payer: Self-pay

## 2020-03-28 ENCOUNTER — Ambulatory Visit (INDEPENDENT_AMBULATORY_CARE_PROVIDER_SITE_OTHER): Payer: BC Managed Care – PPO | Admitting: Nurse Practitioner

## 2020-03-28 VITALS — BP 122/80 | HR 97 | Temp 98.0°F | Ht 67.0 in | Wt 215.6 lb

## 2020-03-28 DIAGNOSIS — E559 Vitamin D deficiency, unspecified: Secondary | ICD-10-CM

## 2020-03-28 DIAGNOSIS — Z6833 Body mass index (BMI) 33.0-33.9, adult: Secondary | ICD-10-CM

## 2020-03-28 DIAGNOSIS — Z1159 Encounter for screening for other viral diseases: Secondary | ICD-10-CM | POA: Diagnosis not present

## 2020-03-28 DIAGNOSIS — E6609 Other obesity due to excess calories: Secondary | ICD-10-CM

## 2020-03-28 DIAGNOSIS — Z0000 Encounter for general adult medical examination without abnormal findings: Secondary | ICD-10-CM

## 2020-03-28 DIAGNOSIS — I10 Essential (primary) hypertension: Secondary | ICD-10-CM | POA: Diagnosis not present

## 2020-03-28 DIAGNOSIS — Z6837 Body mass index (BMI) 37.0-37.9, adult: Secondary | ICD-10-CM

## 2020-03-28 MED ORDER — PHENTERMINE HCL 37.5 MG PO TABS: 37.5000 mg | ORAL_TABLET | Freq: Every day | ORAL | 1 refills | Status: DC

## 2020-03-28 NOTE — Progress Notes (Signed)
I,Yamilka Roman [employer] as a Education administrator for [employer], FNP.,have documented all relevant documentation on the behalf of [name], FNP,as directed by  [name], FNP while in the presence of [name], [place].  This visit occurred during the SARS-CoV-2 public health emergency.  Safety protocols were in place, including screening questions prior to the visit, additional usage of staff PPE, and extensive cleaning of exam room while observing appropriate contact time as indicated for disinfecting solutions.  Subjective:     Patient ID: [name] , female    DOB: Sep 08, 1975 , 44 y.o.   MRN: [id number]   Chief Complaint  Patient presents with  . Annual Exam    HPI  Patient here for HM  Wt Readings from Last 3 Encounters: 03/28/20 : 215 lb 9.6 oz (97.8 kg) 01/10/20 : 233 lb 6.4 oz (105.9 kg) 11/11/19 : 234 lb (106.1 kg)  She has not had the covid vaccine, she will be going this week to [facility] who has the [employer].  Hypertension This is a chronic problem. The current episode started more than 1 year ago. The problem is unchanged. The problem is controlled. Pertinent negatives include no chest pain, headaches, palpitations or shortness of breath. There are no associated agents to hypertension. Risk factors for coronary artery disease include obesity. Past treatments include diuretics and angiotensin blockers. There are no compliance problems.  There is no history of angina or kidney disease. There is no history of chronic renal disease.     Past Medical History:  Diagnosis Date  . Acute bronchitis 09/11/2018  . Acute calculous cholecystitis 09/11/2018  . Acute dermatitis 09/11/2018  . Acute maxillary sinusitis 09/11/2018  . Acute urinary tract infection 09/11/2018  . Anemia   . 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 Hyperlipidemia    patient denies  . Hypertension   . Mass    on rt inner thigh  . Trichomonal vaginitis 09/11/2018     Family History  Problem Relation  Age of Onset  . COPD Mother   . Healthy Father      Current Outpatient Medications:  .  acetaminophen (TYLENOL) 500 MG tablet, Take 1,000 mg by mouth every 6 (six) hours as needed for headache., Disp: , Rfl:  .  olmesartan-hydrochlorothiazide (BENICAR HCT) 40-25 MG tablet, TAKE 1 TABLET BY MOUTH EVERY DAY, Disp: 90 tablet, Rfl: 1 .  phentermine (ADIPEX-P) 37.5 MG tablet, Take 1 tablet (37.5 mg total) by mouth daily before breakfast., Disp: 30 tablet, Rfl: 1 .  NUVARING 0.12-0.015 MG/24HR vaginal ring, Place 1 application vaginally every 30 (thirty) days. (Patient not taking: Reported on 03/28/2020), Disp: , Rfl: 11 .  Vitamin D, Ergocalciferol, (DRISDOL) 1.25 MG (50000 UNIT) CAPS capsule, TAKE 1 CAPSULE (50,000 UNITS TOTAL) BY MOUTH EVERY 7 (SEVEN) DAYS. (Patient not taking: Reported on 01/10/2020), Disp: 12 capsule, Rfl: 0   Allergies  Allergen Reactions  . Hydrocodone-Acetaminophen Nausea And Vomiting      The patient states she uses Nuvaring - does not use regularly for birth control. Last LMP was No LMP recorded.. Negative for Dysmenorrhea and Negative for Menorrhagia. Negative for: breast discharge, breast lump(s), breast pain and breast self exam. Associated symptoms include abnormal vaginal bleeding. Pertinent negatives include abnormal bleeding (hematology), anxiety, decreased libido, depression, difficulty falling sleep, dyspareunia, history of infertility, nocturia, sexual dysfunction, sleep disturbances, urinary incontinence, urinary urgency, vaginal discharge and vaginal itching. Diet regular; she is not eating sugar. She has been doing veggies and meats. The patient  states her exercise level is minimal - she is walking and using an exercise ball.    The patient's tobacco use is:  Social History   Tobacco Use  Smoking Status Never Smoker  Smokeless Tobacco Never Used   She has been exposed to passive smoke. The patient's alcohol use is:  Social History   Substance and Sexual  Activity  Alcohol Use No   Additional information: [facility] Last pap 2019, next one scheduled for next month.  Review of Systems  Constitutional: Negative.   HENT: Negative.   Eyes: Negative.   Respiratory: Negative.  Negative for cough and shortness of breath.   Cardiovascular: Negative.  Negative for chest pain, palpitations and leg swelling.  Gastrointestinal: Negative.   Endocrine: Negative.   Genitourinary: Negative.   Musculoskeletal: Negative.   Skin: Negative.   Allergic/Immunologic: Negative.   Neurological: Negative.  Negative for dizziness and headaches.  Hematological: Negative.   Psychiatric/Behavioral: Negative.       Today's Vitals   03/28/20 1059  BP: 122/80  Pulse: 97  Temp: 98 F (36.7 C)  Weight: 215 lb 9.6 oz (97.8 kg)  Height: 5' 7"  (1.702 m)  PainSc: 0-No pain   Body mass index is 33.77 kg/m.   Objective:  Physical Exam Constitutional:      General: She is not in acute distress.    Appearance: Normal appearance. She is well-developed. She is obese.  HENT:     Head: Normocephalic and atraumatic.     Right Ear: Hearing, tympanic membrane, ear canal and external ear normal. There is no impacted cerumen.     Left Ear: Hearing, tympanic membrane, ear canal and external ear normal. There is no impacted cerumen.     Nose: Nose normal.     Mouth/Throat:     Mouth: Mucous membranes are dry.  Eyes:     General: Lids are normal.     Extraocular Movements: Extraocular movements intact.     Conjunctiva/sclera: Conjunctivae normal.     Pupils: Pupils are equal, round, and reactive to light.     Funduscopic exam:    Right eye: No papilledema.        Left eye: No papilledema.  Neck:     Thyroid: No thyroid mass.     Vascular: No carotid bruit.  Cardiovascular:     Rate and Rhythm: Normal rate and regular rhythm.     Pulses: Normal pulses.     Heart sounds: Normal heart sounds. No murmur heard.   Pulmonary:     Effort: Pulmonary effort  is normal.     Breath sounds: Normal breath sounds.  Abdominal:     General: Abdomen is flat. Bowel sounds are normal. There is no distension.     Palpations: Abdomen is soft.     Tenderness: There is no abdominal tenderness.  Genitourinary:    Rectum: Guaiac result negative.  Musculoskeletal:        General: No swelling. Normal range of motion.     Cervical back: Full passive range of motion without pain, normal range of motion and neck supple.     Right lower leg: No edema.     Left lower leg: No edema.  Skin:    General: Skin is warm and dry.     Capillary Refill: Capillary refill takes less than 2 seconds.  Neurological:     General: No focal deficit present.     Mental Status: She is alert and oriented to person, place,  and time.     Cranial Nerves: No cranial nerve deficit.     Sensory: No sensory deficit.  Psychiatric:        Mood and Affect: Mood normal.        Behavior: Behavior normal.        Thought Content: Thought content normal.        Judgment: Judgment normal.         Assessment And Plan:     1. Health maintenance examination . Behavior modifications discussed and diet history reviewed.   . Pt will continue to exercise regularly and modify diet with low GI, plant based foods and decrease intake of processed foods.  . Recommend intake of daily multivitamin, Vitamin D, and calcium.  . Recommend for preventive screenings, as well as recommend immunizations that include influenza, TDAP (up to date) - CBC - Hemoglobin A1c  2. Encounter for hepatitis C screening test for low risk patient  Will check Hepatitis C screening due to recent recommendations to screen all adults 18 years and older - Hepatitis C antibody  3. Vitamin D deficiency  Will check vitamin D level and supplement as needed.     Also encouraged to spend 15 minutes in the sun daily.  - VITAMIN D 25 Hydroxy (Vit-D Deficiency, Fractures)  4. Essential hypertension . B/P is well controlled.   . CMP ordered to check renal function.  . The importance of regular exercise and dietary modification was stressed to the patient.  . Stressed importance of losing ten percent of her body weight to help with B/P control.  . EKG done with NSR HR 87 - CMP14+EGFR - EKG 12-Lead  5. BMI 33.0-33.9,adult  She has lost approximately 18 lbs since her last office visit  Tolerating phentermine well.  Encouraged to exercise at least 150 minutes a week and eat a healthy diet - Lipid panel - phentermine (ADIPEX-P) 37.5 MG tablet; Take 1 tablet (37.5 mg total) by mouth daily before breakfast.  Dispense: 30 tablet; Refill: 1     Patient was given opportunity to ask questions. Patient verbalized understanding of the plan and was able to repeat key elements of the plan. All questions were answered to their satisfaction.   Teola Bradley, FNP, have reviewed all documentation for this visit. The documentation on 04/26/20 for the exam, diagnosis, procedures, and orders are all accurate and complete.  THE PATIENT IS ENCOURAGED TO PRACTICE SOCIAL DISTANCING DUE TO THE COVID-19 PANDEMIC.

## 2020-03-28 NOTE — Patient Instructions (Signed)
Health Maintenance, Female Adopting a healthy lifestyle and getting preventive care are important in promoting health and wellness. Ask your health care provider about:  The right schedule for you to have regular tests and exams.  Things you can do on your own to prevent diseases and keep yourself healthy. What should I know about diet, weight, and exercise? Eat a healthy diet   Eat a diet that includes plenty of vegetables, fruits, low-fat dairy products, and lean protein.  Do not eat a lot of foods that are high in solid fats, added sugars, or sodium. Maintain a healthy weight Body mass index (BMI) is used to identify weight problems. It estimates body fat based on height and weight. Your health care provider can help determine your BMI and help you achieve or maintain a healthy weight. Get regular exercise Get regular exercise. This is one of the most important things you can do for your health. Most adults should:  Exercise for at least 150 minutes each week. The exercise should increase your heart rate and make you sweat (moderate-intensity exercise).  Do strengthening exercises at least twice a week. This is in addition to the moderate-intensity exercise.  Spend less time sitting. Even light physical activity can be beneficial. Watch cholesterol and blood lipids Have your blood tested for lipids and cholesterol at 43 years of age, then have this test every 5 years. Have your cholesterol levels checked more often if:  Your lipid or cholesterol levels are high.  You are older than 43 years of age.  You are at high risk for heart disease. What should I know about cancer screening? Depending on your health history and family history, you may need to have cancer screening at various ages. This may include screening for:  Breast cancer.  Cervical cancer.  Colorectal cancer.  Skin cancer.  Lung cancer. What should I know about heart disease, diabetes, and high blood  pressure? Blood pressure and heart disease  High blood pressure causes heart disease and increases the risk of stroke. This is more likely to develop in people who have high blood pressure readings, are of African descent, or are overweight.  Have your blood pressure checked: ? Every 3-5 years if you are 18-39 years of age. ? Every year if you are 40 years old or older. Diabetes Have regular diabetes screenings. This checks your fasting blood sugar level. Have the screening done:  Once every three years after age 40 if you are at a normal weight and have a low risk for diabetes.  More often and at a younger age if you are overweight or have a high risk for diabetes. What should I know about preventing infection? Hepatitis B If you have a higher risk for hepatitis B, you should be screened for this virus. Talk with your health care provider to find out if you are at risk for hepatitis B infection. Hepatitis C Testing is recommended for:  Everyone born from 1945 through 1965.  Anyone with known risk factors for hepatitis C. Sexually transmitted infections (STIs)  Get screened for STIs, including gonorrhea and chlamydia, if: ? You are sexually active and are younger than 43 years of age. ? You are older than 43 years of age and your health care provider tells you that you are at risk for this type of infection. ? Your sexual activity has changed since you were last screened, and you are at increased risk for chlamydia or gonorrhea. Ask your health care provider if   you are at risk.  Ask your health care provider about whether you are at high risk for HIV. Your health care provider may recommend a prescription medicine to help prevent HIV infection. If you choose to take medicine to prevent HIV, you should first get tested for HIV. You should then be tested every 3 months for as long as you are taking the medicine. Pregnancy  If you are about to stop having your period (premenopausal) and  you may become pregnant, seek counseling before you get pregnant.  Take 400 to 800 micrograms (mcg) of folic acid every day if you become pregnant.  Ask for birth control (contraception) if you want to prevent pregnancy. Osteoporosis and menopause Osteoporosis is a disease in which the bones lose minerals and strength with aging. This can result in bone fractures. If you are 65 years old or older, or if you are at risk for osteoporosis and fractures, ask your health care provider if you should:  Be screened for bone loss.  Take a calcium or vitamin D supplement to lower your risk of fractures.  Be given hormone replacement therapy (HRT) to treat symptoms of menopause. Follow these instructions at home: Lifestyle  Do not use any products that contain nicotine or tobacco, such as cigarettes, e-cigarettes, and chewing tobacco. If you need help quitting, ask your health care provider.  Do not use street drugs.  Do not share needles.  Ask your health care provider for help if you need support or information about quitting drugs. Alcohol use  Do not drink alcohol if: ? Your health care provider tells you not to drink. ? You are pregnant, may be pregnant, or are planning to become pregnant.  If you drink alcohol: ? Limit how much you use to 0-1 drink a day. ? Limit intake if you are breastfeeding.  Be aware of how much alcohol is in your drink. In the [place] one drink equals one 12 oz bottle of beer (355 mL), one 5 oz glass of wine (148 mL), or one 1 oz glass of hard liquor (44 mL). General instructions  Schedule regular health, dental, and eye exams.  Stay current with your vaccines.  Tell your health care provider if: ? You often feel depressed. ? You have ever been abused or do not feel safe at home. Summary  Adopting a healthy lifestyle and getting preventive care are important in promoting health and wellness.  Follow your health care provider's instructions about healthy  diet, exercising, and getting tested or screened for diseases.  Follow your health care provider's instructions on monitoring your cholesterol and blood pressure. This information is not intended to replace advice given to you by your health care provider. Make sure you discuss any questions you have with your health care provider. Document Revised: 07/15/2018 Document Reviewed: 07/15/2018 Elsevier Patient Education  2020 [employer]  

## 2020-03-29 LAB — CBC
Hematocrit: 36.9 % (ref 34.0–46.6)
Hemoglobin: 11.9 g/dL (ref 11.1–15.9)
MCH: 26.2 pg — ABNORMAL LOW (ref 26.6–33.0)
MCHC: 32.2 g/dL (ref 31.5–35.7)
MCV: 81 fL (ref 79–97)
Platelets: 306 10*3/uL (ref 150–450)
RBC: 4.55 x10E6/uL (ref 3.77–5.28)
RDW: 14.5 % (ref 11.7–15.4)
WBC: 6.6 10*3/uL (ref 3.4–10.8)

## 2020-03-29 LAB — CMP14+EGFR
ALT: 13 IU/L (ref 0–32)
AST: 11 IU/L (ref 0–40)
Albumin/Globulin Ratio: 1.5 (ref 1.2–2.2)
Albumin: 4.2 g/dL (ref 3.8–4.8)
Alkaline Phosphatase: 83 IU/L (ref 48–121)
BUN/Creatinine Ratio: 15 (ref 9–23)
BUN: 11 mg/dL (ref 6–24)
Bilirubin Total: 0.3 mg/dL (ref 0.0–1.2)
CO2: 25 mmol/L (ref 20–29)
Calcium: 9.2 mg/dL (ref 8.7–10.2)
Chloride: 102 mmol/L (ref 96–106)
Creatinine, Ser: 0.71 mg/dL (ref 0.57–1.00)
GFR calc Af Amer: 121 mL/min/{1.73_m2} (ref >59)
GFR calc non Af Amer: 105 mL/min/{1.73_m2} (ref >59)
Globulin, Total: 2.8 g/dL (ref 1.5–4.5)
Glucose: 91 mg/dL (ref 65–99)
Potassium: 3.9 mmol/L (ref 3.5–5.2)
Sodium: 140 mmol/L (ref 134–144)
Total Protein: 7 g/dL (ref 6.0–8.5)

## 2020-03-29 LAB — HEMOGLOBIN A1C
Est. average glucose Bld gHb Est-mCnc: 126 mg/dL
Hgb A1c MFr Bld: 6 % — ABNORMAL HIGH (ref 4.8–5.6)

## 2020-03-29 LAB — LIPID PANEL
Chol/HDL Ratio: 3.7 ratio (ref 0.0–4.4)
Cholesterol, Total: 195 mg/dL (ref 100–199)
HDL: 53 mg/dL (ref >39)
LDL Chol Calc (NIH): 134 mg/dL — ABNORMAL HIGH (ref 0–99)
Triglycerides: 45 mg/dL (ref 0–149)
VLDL Cholesterol Cal: 8 mg/dL (ref 5–40)

## 2020-03-29 LAB — HEPATITIS C ANTIBODY: Hep C Virus Ab: <0.1 s/co ratio (ref 0.0–0.9)

## 2020-03-29 LAB — VITAMIN D 25 HYDROXY (VIT D DEFICIENCY, FRACTURES): Vit D, 25-Hydroxy: 25 ng/mL — ABNORMAL LOW (ref 30.0–100.0)

## 2020-04-03 ENCOUNTER — Other Ambulatory Visit: Payer: Self-pay | Admitting: Surgery

## 2020-04-03 ENCOUNTER — Other Ambulatory Visit: Payer: Self-pay | Admitting: Obstetrics and Gynecology

## 2020-04-03 DIAGNOSIS — D242 Benign neoplasm of left breast: Secondary | ICD-10-CM

## 2020-04-26 MED ORDER — VITAMIN D (ERGOCALCIFEROL) 1.25 MG (50000 UNIT) PO CAPS
50000.0000 [IU] | ORAL_CAPSULE | ORAL | 1 refills | Status: DC
Start: 2020-04-26 — End: 2020-09-01

## 2020-05-05 ENCOUNTER — Encounter: Payer: Self-pay | Admitting: Nurse Practitioner

## 2020-06-01 ENCOUNTER — Encounter: Payer: Self-pay | Admitting: Nurse Practitioner

## 2020-06-01 ENCOUNTER — Telehealth (INDEPENDENT_AMBULATORY_CARE_PROVIDER_SITE_OTHER): Payer: BC Managed Care – PPO | Admitting: Nurse Practitioner

## 2020-06-01 VITALS — Wt 213.0 lb

## 2020-06-01 DIAGNOSIS — Z6833 Body mass index (BMI) 33.0-33.9, adult: Secondary | ICD-10-CM | POA: Diagnosis not present

## 2020-06-01 DIAGNOSIS — K5900 Constipation, unspecified: Secondary | ICD-10-CM

## 2020-06-01 DIAGNOSIS — E6609 Other obesity due to excess calories: Secondary | ICD-10-CM

## 2020-06-01 MED ORDER — LINACLOTIDE 72 MCG PO CAPS: 72.0000 ug | ORAL_CAPSULE | Freq: Every day | ORAL | 2 refills | Status: DC

## 2020-06-01 MED ORDER — PHENTERMINE HCL 37.5 MG PO TABS: 37.5000 mg | ORAL_TABLET | Freq: Every day | ORAL | 1 refills | Status: DC

## 2020-06-01 NOTE — Progress Notes (Signed)
Virtual Visit via MyChart   This visit type was conducted due to national recommendations for restrictions regarding the COVID-19 Pandemic (e.g. social distancing) in an effort to limit this patient's exposure and mitigate transmission in our community.  Due to her co-morbid illnesses, this patient is at least at moderate risk for complications without adequate follow up.  This format is felt to be most appropriate for this patient at this time.  All issues noted in this document were discussed and addressed.  A limited physical exam was performed with this format.    This visit type was conducted due to national recommendations for restrictions regarding the COVID-19 Pandemic (e.g. social distancing) in an effort to limit this patient's exposure and mitigate transmission in our community.  Patients identity confirmed using two different identifiers.  This format is felt to be most appropriate for this patient at this time.  All issues noted in this document were discussed and addressed.  No physical exam was performed (except for noted visual exam findings with Video Visits).    Date:  06/04/2020   ID:  [name], DOB [birth year], MRN [id number]  Patient Location:  Home - spoke with [name]  Provider location:   Office    Chief Complaint:    History of Present Illness:    [name] is a 44 y.o. female who presents via video conferencing for a telehealth visit today.    The patient does not have symptoms concerning for COVID-19 infection (fever, chills, cough, or new shortness of breath).   She has been partially vaccinated.  Her appt is scheduled for November 5th.   Wt Readings from Last 3 Encounters: 06/01/20 : 213 lb (96.6 kg) 03/28/20 : 215 lb 9.6 oz (97.8 kg) 01/10/20 : 233 lb 6.4 oz (105.9 kg)  She has been feeling bloated. She is also taking miralax.  She has increased her fiber intake. When she tries to exercise she is more regular with her bowel  movements.      Past Medical History:  Diagnosis Date  . Acute bronchitis 09/11/2018  . Acute calculous cholecystitis 09/11/2018  . Acute dermatitis 09/11/2018  . Acute maxillary sinusitis 09/11/2018  . Acute urinary tract infection 09/11/2018  . Anemia   . 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 Hyperlipidemia    patient denies  . Hypertension   . Mass    on rt inner thigh  . Trichomonal vaginitis 09/11/2018   Past Surgical History:  Procedure Laterality Date  . BREAST EXCISIONAL BIOPSY Left 2003   papiloma removed  . BREAST SURGERY     left breast papilloma  . CESAREAN SECTION  07/11/03  . LAPAROSCOPIC CHOLECYSTECTOMY SINGLE SITE WITH INTRAOPERATIVE CHOLANGIOGRAM N/A 09/11/2018   Procedure: LAPAROSCOPIC CHOLECYSTECTOMY SINGLE SITE WITH INTRAOPERATIVE CHOLANGIOGRAM;  Surgeon: Michael Boston, MD;  Location: [facility];  Service: General;  Laterality: N/A;  . MASS EXCISION  05/21/05   inner lt thigh  . MYOMECTOMY N/A 07/20/2015   Procedure: MYOMECTOMY, ABDOMINAL;  Surgeon: Arvella Nigh, MD;  Location: [facility];  Service: Gynecology;  Laterality: N/A;  . WISDOM TOOTH EXTRACTION       Current Meds  Medication Sig  . acetaminophen (TYLENOL) 500 MG tablet Take 1,000 mg by mouth every 6 (six) hours as needed for headache.  . olmesartan-hydrochlorothiazide (BENICAR HCT) 40-25 MG tablet TAKE 1 TABLET BY MOUTH EVERY DAY  . phentermine (ADIPEX-P) 37.5 MG tablet Take 1 tablet (37.5 mg total) by mouth daily before breakfast.  .  Vitamin D, Ergocalciferol, (DRISDOL) 1.25 MG (50000 UNIT) CAPS capsule Take 1 capsule (50,000 Units total) by mouth every 7 (seven) days.  . [DISCONTINUED] phentermine (ADIPEX-P) 37.5 MG tablet Take 1 tablet (37.5 mg total) by mouth daily before breakfast.     Allergies:   Hydrocodone-acetaminophen   Social History   Tobacco Use  . Smoking status: Never Smoker  . Smokeless tobacco: Never Used  Vaping Use  . Vaping Use: Never used  Substance Use Topics  . Alcohol use:  No  . Drug use: No     Family Hx: The patient's family history includes COPD in her mother; Healthy in her father.  ROS:   Please see the history of present illness.    Review of Systems  Constitutional: Negative.   Cardiovascular: Negative.   Musculoskeletal: Negative.   Neurological: Negative for dizziness and tingling.  Psychiatric/Behavioral: Negative.     All other systems reviewed and are negative.   Labs/Other Tests and Data Reviewed:    Recent Labs: 03/28/2020: ALT 13; BUN 11; Creatinine, Ser 0.71; Hemoglobin 11.9; Platelets 306; Potassium 3.9; Sodium 140   Recent Lipid Panel Lab Results  Component Value Date/Time   CHOL 195 03/28/2020 12:16 PM   TRIG 45 03/28/2020 12:16 PM   HDL 53 03/28/2020 12:16 PM   CHOLHDL 3.7 03/28/2020 12:16 PM   LDLCALC 134 (H) 03/28/2020 12:16 PM    Wt Readings from Last 3 Encounters:  06/01/20 213 lb (96.6 kg)  03/28/20 215 lb 9.6 oz (97.8 kg)  01/10/20 233 lb 6.4 oz (105.9 kg)     Exam:    Vital Signs:  Wt 213 lb (96.6 kg)   BMI 33.36 kg/m     Physical Exam Constitutional:      General: She is not in acute distress.    Appearance: Normal appearance.  Cardiovascular:     Pulses: Normal pulses.     Heart sounds: Normal heart sounds. No murmur heard.   Pulmonary:     Effort: Pulmonary effort is normal. No respiratory distress.     Breath sounds: Normal breath sounds.  Neurological:     General: No focal deficit present.     Mental Status: She is alert and oriented to person, place, and time.     Cranial Nerves: No cranial nerve deficit.  Psychiatric:        Mood and Affect: Mood normal.        Behavior: Behavior normal.        Thought Content: Thought content normal.        Judgment: Judgment normal.     ASSESSMENT & PLAN:    1. Class 1 obesity due to excess calories without serious comorbidity with body mass index (BMI) of 33.0 to 33.9 in adult  She has had a little weight loss of 3 lbs, tolerating phentermine  well, refill sent  She feels her weight is affected by her feeling bloated and having constipation - phentermine (ADIPEX-P) 37.5 MG tablet; Take 1 tablet (37.5 mg total) by mouth daily before breakfast.  Dispense: 30 tablet; Refill: 1  2. Constipation, unspecified constipation type  Infrequent bowel movements throughout the week.   Will send prescription for Linzess and she will come and pick up samples next week to try  Will follow up with her next visit - linaclotide (LINZESS) 72 MCG capsule; Take 1 capsule (72 mcg total) by mouth daily before breakfast.  Dispense: 30 capsule; Refill: 2   COVID-19 Education: The signs and symptoms of  COVID-19 were discussed with the patient and how to seek care for testing (follow up with PCP or arrange E-visit).  The importance of social distancing was discussed today.  Patient Risk:   After full review of this patients clinical status, I feel that they are at least moderate risk at this time.  Time:   Today, I have spent 12 minutes/ seconds with the patient with telehealth technology discussing above diagnoses.     Medication Adjustments/Labs and Tests Ordered: Current medicines are reviewed at length with the patient today.  Concerns regarding medicines are outlined above.   Tests Ordered: No orders of the defined types were placed in this encounter.   Medication Changes: Meds ordered this encounter  Medications  . phentermine (ADIPEX-P) 37.5 MG tablet    Sig: Take 1 tablet (37.5 mg total) by mouth daily before breakfast.    Dispense:  30 tablet    Refill:  1    Not to exceed 4 additional fills before 07/08/2020  . linaclotide (LINZESS) 72 MCG capsule    Sig: Take 1 capsule (72 mcg total) by mouth daily before breakfast.    Dispense:  30 capsule    Refill:  2    Disposition:  Follow up in 2 month(s)  Signed, [name], FNP

## 2020-06-05 ENCOUNTER — Other Ambulatory Visit: Payer: Self-pay | Admitting: Obstetrics and Gynecology

## 2020-06-05 ENCOUNTER — Ambulatory Visit
Admission: RE | Admit: 2020-06-05 | Discharge: 2020-06-05 | Disposition: A | Discharge status: Home / Self Care | Payer: 59 | Source: Ambulatory Visit | Attending: Obstetrics and Gynecology | Admitting: Obstetrics and Gynecology

## 2020-06-05 ENCOUNTER — Other Ambulatory Visit: Payer: Self-pay

## 2020-06-05 ENCOUNTER — Ambulatory Visit
Admission: RE | Admit: 2020-06-05 | Discharge: 2020-06-05 | Disposition: A | Discharge status: Home / Self Care | Payer: BC Managed Care – PPO | Source: Ambulatory Visit | Attending: Obstetrics and Gynecology | Admitting: Obstetrics and Gynecology

## 2020-06-05 DIAGNOSIS — D242 Benign neoplasm of left breast: Secondary | ICD-10-CM

## 2020-06-05 LAB — HM MAMMOGRAPHY

## 2020-06-06 ENCOUNTER — Ambulatory Visit: Payer: BC Managed Care – PPO | Admitting: Podiatry

## 2020-06-20 ENCOUNTER — Encounter: Payer: Self-pay | Admitting: Nurse Practitioner

## 2020-07-10 ENCOUNTER — Other Ambulatory Visit: Payer: Self-pay | Admitting: Surgery

## 2020-07-10 DIAGNOSIS — D241 Benign neoplasm of right breast: Secondary | ICD-10-CM

## 2020-07-10 DIAGNOSIS — N6489 Other specified disorders of breast: Secondary | ICD-10-CM

## 2020-07-30 ENCOUNTER — Encounter: Payer: Self-pay | Admitting: Nurse Practitioner

## 2020-08-02 ENCOUNTER — Telehealth: Payer: Self-pay

## 2020-08-02 ENCOUNTER — Other Ambulatory Visit: Payer: Self-pay | Admitting: Surgery

## 2020-08-02 ENCOUNTER — Ambulatory Visit (INDEPENDENT_AMBULATORY_CARE_PROVIDER_SITE_OTHER): Payer: BC Managed Care – PPO | Admitting: Nurse Practitioner

## 2020-08-02 ENCOUNTER — Other Ambulatory Visit: Payer: Self-pay

## 2020-08-02 ENCOUNTER — Encounter: Payer: Self-pay | Admitting: Nurse Practitioner

## 2020-08-02 VITALS — BP 120/80 | HR 80 | Temp 98.1°F | Ht 64.2 in | Wt 214.4 lb

## 2020-08-02 DIAGNOSIS — D241 Benign neoplasm of right breast: Secondary | ICD-10-CM

## 2020-08-02 DIAGNOSIS — M25561 Pain in right knee: Secondary | ICD-10-CM

## 2020-08-02 DIAGNOSIS — G8929 Other chronic pain: Secondary | ICD-10-CM

## 2020-08-02 DIAGNOSIS — N6489 Other specified disorders of breast: Secondary | ICD-10-CM

## 2020-08-02 MED ORDER — IBUPROFEN 800 MG PO TABS: 800.0000 mg | ORAL_TABLET | Freq: Three times a day (TID) | ORAL | 0 refills | Status: DC | PRN

## 2020-08-02 NOTE — Telephone Encounter (Signed)
I returned the pt's call and notified her that she was to go to the urgent care department at [facility].

## 2020-08-02 NOTE — Progress Notes (Signed)
I,[name],acting as a Education administrator for [employer], NP.,have documented all relevant documentation on the behalf of [employer], NP,as directed by  Bary Castilla, NP while in the presence of Bary Castilla, NP.  This visit occurred during the SARS-CoV-2 public health emergency.  Safety protocols were in place, including screening questions prior to the visit, additional usage of staff PPE, and extensive cleaning of exam room while observing appropriate contact time as indicated for disinfecting solutions.  Subjective:     Patient ID: [name] , female    DOB: 06/23/1976 , 44 y.o.   MRN: [id number]   Chief Complaint  Patient presents with  . Knee Pain    HPI  Patient is here for knee pain which started a week ago. She does not recall falling or bumping. Topical cream did not work which was prescribed last time. Patient said the pain is 10/10 today. The pain does not radiate anywhere. She has been taking ibuprofen at home which has helped her with some relief. She describes the pain as burning sensation at times.   Knee Pain  The incident occurred more than 1 week ago. There was no injury mechanism. The pain is present in the right leg. The quality of the pain is described as aching. The pain is at a severity of 10/10. The pain is moderate. The pain has been constant since onset. Associated symptoms include numbness and tingling. The symptoms are aggravated by movement and weight bearing. She has tried NSAIDs and ice for the symptoms. The treatment provided mild relief.     Past Medical History:  Diagnosis Date  . Acute bronchitis 09/11/2018  . Acute calculous cholecystitis 09/11/2018  . Acute dermatitis 09/11/2018  . Acute maxillary sinusitis 09/11/2018  . Acute urinary tract infection 09/11/2018  . Anemia   . 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 Hyperlipidemia    patient denies  . Hypertension   . Mass    on rt inner thigh  . Trichomonal vaginitis 09/11/2018      Family History  Problem Relation Age of Onset  . COPD Mother   . Healthy Father      Current Outpatient Medications:  .  ibuprofen (ADVIL) 800 MG tablet, Take 1 tablet (800 mg total) by mouth every 8 (eight) hours as needed., Disp: 30 tablet, Rfl: 0 .  acetaminophen (TYLENOL) 500 MG tablet, Take 1,000 mg by mouth every 6 (six) hours as needed for headache., Disp: , Rfl:  .  linaclotide (LINZESS) 72 MCG capsule, Take 1 capsule (72 mcg total) by mouth daily before breakfast., Disp: 30 capsule, Rfl: 2 .  NUVARING 0.12-0.015 MG/24HR vaginal ring, Place 1 application vaginally every 30 (thirty) days.  (Patient not taking: Reported on 06/01/2020), Disp: , Rfl: 11 .  olmesartan-hydrochlorothiazide (BENICAR HCT) 40-25 MG tablet, TAKE 1 TABLET BY MOUTH EVERY DAY, Disp: 90 tablet, Rfl: 1 .  phentermine (ADIPEX-P) 37.5 MG tablet, Take 1 tablet (37.5 mg total) by mouth daily before breakfast., Disp: 30 tablet, Rfl: 1 .  Vitamin D, Ergocalciferol, (DRISDOL) 1.25 MG (50000 UNIT) CAPS capsule, Take 1 capsule (50,000 Units total) by mouth every 7 (seven) days., Disp: 12 capsule, Rfl: 1   Allergies  Allergen Reactions  . Hydrocodone-Acetaminophen Nausea And Vomiting     Review of Systems  Respiratory: Negative for chest tightness, shortness of breath and wheezing.   Cardiovascular: Negative for chest pain.  Musculoskeletal: Positive for myalgias. Negative for gait problem.  Neurological: Positive for tingling and numbness.  Negative for headaches.     Today's Vitals   08/02/20 1219  BP: 120/80  Pulse: 80  Temp: 98.1 F (36.7 C)  TempSrc: Oral  Weight: 214 lb 6.4 oz (97.3 kg)  Height: 5' 4.2" (1.631 m)   Body mass index is 36.57 kg/m.  Wt Readings from Last 3 Encounters:  08/02/20 214 lb 6.4 oz (97.3 kg)  06/01/20 213 lb (96.6 kg)  03/28/20 215 lb 9.6 oz (97.8 kg)     Objective:  Physical Exam Constitutional:      Appearance: She is obese.  Cardiovascular:     Pulses: Normal  pulses.     Heart sounds: Normal heart sounds.  Pulmonary:     Effort: Pulmonary effort is normal.     Breath sounds: Normal breath sounds. No wheezing or rales.  Musculoskeletal:        General: Swelling present.     Right knee: Swelling present. Decreased range of motion.     Right lower leg: Tenderness present. No swelling.     Comments: Swelling and tenderness to right knee upon palpitation.   Neurological:     Mental Status: She is alert.  Psychiatric:        Mood and Affect: Mood normal.        Behavior: Behavior normal.        Thought Content: Thought content normal.        Judgment: Judgment normal.         Assessment And Plan:     1. Chronic pain of right knee - AMB referral to orthopedics -The patient was referred to [facility] for xray and further evaluation  -The patient was educated on applying ice and elevating her leg and taking ibuprofen as needed. -Patient declined in office Toradol injection for pain relief.   -The patient was instructed if she experiences any severe pain leg pain or any other symptoms to go to seek immediate care.  - ibuprofen (ADVIL) 800 MG tablet; Take 1 tablet (800 mg total) by mouth every 8 (eight) hours as needed.  Dispense: 30 tablet; Refill: 0    Patient was given opportunity to ask questions. Patient verbalized understanding of the plan and was able to repeat key elements of the plan. All questions were answered to their satisfaction.  Charlesetta Ivory, NP   I, Charlesetta Ivory, NP, have reviewed all documentation for this visit. The documentation on 08/02/20 for the exam, diagnosis, procedures, and orders are all accurate and complete.  THE PATIENT IS ENCOURAGED TO PRACTICE SOCIAL DISTANCING DUE TO THE COVID-19 PANDEMIC.

## 2020-08-10 ENCOUNTER — Ambulatory Visit: Payer: BC Managed Care – PPO | Admitting: Orthopedic Surgery

## 2020-08-18 ENCOUNTER — Other Ambulatory Visit: Payer: Self-pay | Admitting: Nurse Practitioner

## 2020-09-01 ENCOUNTER — Encounter (HOSPITAL_BASED_OUTPATIENT_CLINIC_OR_DEPARTMENT_OTHER): Payer: Self-pay | Admitting: Surgery

## 2020-09-01 ENCOUNTER — Other Ambulatory Visit: Payer: Self-pay

## 2020-09-03 ENCOUNTER — Other Ambulatory Visit: Payer: Self-pay | Admitting: Surgery

## 2020-09-03 DIAGNOSIS — N6489 Other specified disorders of breast: Secondary | ICD-10-CM

## 2020-09-05 ENCOUNTER — Encounter (HOSPITAL_BASED_OUTPATIENT_CLINIC_OR_DEPARTMENT_OTHER)
Admission: RE | Admit: 2020-09-05 | Discharge: 2020-09-05 | Disposition: A | Discharge status: Home / Self Care | Payer: BC Managed Care – PPO | Source: Ambulatory Visit | Attending: Surgery | Admitting: Surgery

## 2020-09-05 ENCOUNTER — Other Ambulatory Visit (HOSPITAL_COMMUNITY)
Admission: RE | Admit: 2020-09-05 | Discharge: 2020-09-05 | Disposition: A | Discharge status: Home / Self Care | Payer: BC Managed Care – PPO | Source: Ambulatory Visit | Attending: Surgery | Admitting: Surgery

## 2020-09-05 DIAGNOSIS — Z8616 Personal history of COVID-19: Secondary | ICD-10-CM | POA: Diagnosis not present

## 2020-09-05 DIAGNOSIS — D241 Benign neoplasm of right breast: Secondary | ICD-10-CM | POA: Diagnosis not present

## 2020-09-05 DIAGNOSIS — Z01812 Encounter for preprocedural laboratory examination: Secondary | ICD-10-CM | POA: Insufficient documentation

## 2020-09-05 DIAGNOSIS — N6021 Fibroadenosis of right breast: Secondary | ICD-10-CM | POA: Diagnosis not present

## 2020-09-05 DIAGNOSIS — Z20822 Contact with and (suspected) exposure to covid-19: Secondary | ICD-10-CM | POA: Insufficient documentation

## 2020-09-05 DIAGNOSIS — Z79899 Other long term (current) drug therapy: Secondary | ICD-10-CM | POA: Diagnosis not present

## 2020-09-05 LAB — SARS CORONAVIRUS 2 (TAT 6-24 HRS): SARS Coronavirus 2: NEGATIVE

## 2020-09-05 LAB — BASIC METABOLIC PANEL
Anion gap: 10 (ref 5–15)
BUN: 12 mg/dL (ref 6–20)
CO2: 21 mmol/L — ABNORMAL LOW (ref 22–32)
Calcium: 8.7 mg/dL — ABNORMAL LOW (ref 8.9–10.3)
Chloride: 107 mmol/L (ref 98–111)
Creatinine, Ser: 0.79 mg/dL (ref 0.44–1.00)
GFR, Estimated: >60 mL/min (ref >60)
Glucose, Bld: 143 mg/dL — ABNORMAL HIGH (ref 70–99)
Potassium: 3.4 mmol/L — ABNORMAL LOW (ref 3.5–5.1)
Sodium: 138 mmol/L (ref 135–145)

## 2020-09-05 MED ORDER — ENSURE PRE-SURGERY PO LIQD: 296.0000 mL | Freq: Once | ORAL | Status: DC

## 2020-09-05 NOTE — Progress Notes (Signed)

## 2020-09-07 ENCOUNTER — Other Ambulatory Visit: Payer: Self-pay

## 2020-09-07 ENCOUNTER — Ambulatory Visit
Admission: RE | Admit: 2020-09-07 | Discharge: 2020-09-07 | Disposition: A | Discharge status: Home / Self Care | Payer: BC Managed Care – PPO | Source: Ambulatory Visit | Attending: Surgery | Admitting: Surgery

## 2020-09-07 ENCOUNTER — Other Ambulatory Visit: Payer: Self-pay | Admitting: Surgery

## 2020-09-07 DIAGNOSIS — D241 Benign neoplasm of right breast: Secondary | ICD-10-CM

## 2020-09-07 DIAGNOSIS — N6489 Other specified disorders of breast: Secondary | ICD-10-CM

## 2020-09-07 NOTE — Anesthesia Preprocedure Evaluation (Addendum)
Anesthesia Evaluation  Patient identified by MRN, date of birth, ID band Patient awake    Reviewed: Allergy & Precautions, NPO status , Patient's Chart, lab work & pertinent test results  History of Anesthesia Complications (+) PONVNegative for: history of anesthetic complications  Airway Mallampati: II  TM Distance: >3 FB Neck ROM: Full    Dental  (+) Dental Advisory Given, Teeth Intact   Pulmonary pneumonia,    Pulmonary exam normal breath sounds clear to auscultation       Cardiovascular hypertension, Pt. on medications Normal cardiovascular exam Rhythm:Regular Rate:Normal     Neuro/Psych PSYCHIATRIC DISORDERS Depression negative neurological ROS     GI/Hepatic negative GI ROS, Neg liver ROS,   Endo/Other   Obesity   Renal/GU negative Renal ROS     Musculoskeletal negative musculoskeletal ROS (+)   Abdominal (+) + obese,   Peds  Hematology  (+) Blood dyscrasia, anemia ,   Anesthesia Other Findings HSV  Reproductive/Obstetrics                            Anesthesia Physical  Anesthesia Plan  ASA: II  Anesthesia Plan: General   Post-op Pain Management:    Induction: Intravenous  PONV Risk Score and Plan: 4 or greater and Treatment may vary due to age or medical condition, Ondansetron, Scopolamine patch - Pre-op, Midazolam, Dexamethasone, TIVA, Propofol infusion, Amisulpride and Diphenhydramine  Airway Management Planned: LMA  Additional Equipment: None  Intra-op Plan:   Post-operative Plan: Extubation in OR  Informed Consent: I have reviewed the patients History and Physical, chart, labs and discussed the procedure including the risks, benefits and alternatives for the proposed anesthesia with the patient or authorized representative who has indicated his/her understanding and acceptance.     Dental advisory given  Plan Discussed with: CRNA  Anesthesia Plan  Comments:       Anesthesia Quick Evaluation

## 2020-09-07 NOTE — H&P (Signed)
   [name]  Location: [facility] Patient #: [id number] DOB: 1976-02-09 Single / Language: [name] / Race: Black or African American Female   History of Present Illness   The patient is a 45 year old female who presents with a complaint of Breast problems. This is a patient I saw last year with multiple abnormalities in the right breast including a right breast papilloma causing nipple discharge and to complex sclerosing lesions. She was going to undergo multiple radioactive seed lumpectomies of the right breast and then developed Covid and surgery could not be performed. She was then going to be rescheduled when she developed cholecystitis requiring a laparoscopic cholecystectomy. She recently went for follow-up films and it was encouraged to come back and see me. She has had no further problems currently from medical standpoint and is doing well and wants to proceed with surgery.   Allergies Malachi Bonds, CMA; No Known Drug Allergies   Medication History Malachi Bonds, CMA; NuvaRing (0.12-0.015MG /24HR Ring, Vaginal) Active. Olmesartan Medoxomil-HCTZ (40-25MG  Tablet, Oral) Active. 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 Again she has an intraductal papilloma of the right breast as well as 2 separate complex sclerosing lesions. Again, complete removal of all 3 areas is recommended for complete histologic  evaluation trauma malignancy. Again, this would be performed with 3 separate radioactive seedguided lumpectomies. I discussed procedure with her including the risks of further surgery need to be done if malignancy is found. Surgery will be scheduled. She agrees to proceed

## 2020-09-08 ENCOUNTER — Other Ambulatory Visit: Payer: Self-pay

## 2020-09-08 ENCOUNTER — Ambulatory Visit
Admission: RE | Admit: 2020-09-08 | Discharge: 2020-09-08 | Disposition: A | Discharge status: Home / Self Care | Payer: BC Managed Care – PPO | Source: Ambulatory Visit | Attending: Surgery | Admitting: Surgery

## 2020-09-08 ENCOUNTER — Encounter (HOSPITAL_BASED_OUTPATIENT_CLINIC_OR_DEPARTMENT_OTHER)
Admission: RE | Disposition: A | Discharge status: Home / Self Care | Payer: Self-pay | Source: Home / Self Care | Attending: Surgery

## 2020-09-08 ENCOUNTER — Ambulatory Visit (HOSPITAL_BASED_OUTPATIENT_CLINIC_OR_DEPARTMENT_OTHER): Payer: BC Managed Care – PPO | Admitting: Anesthesiology

## 2020-09-08 ENCOUNTER — Ambulatory Visit (HOSPITAL_BASED_OUTPATIENT_CLINIC_OR_DEPARTMENT_OTHER)
Admission: RE | Admit: 2020-09-08 | Discharge: 2020-09-08 | Disposition: A | Discharge status: Home / Self Care | Payer: BC Managed Care – PPO | Attending: Surgery | Admitting: Surgery

## 2020-09-08 ENCOUNTER — Encounter (HOSPITAL_BASED_OUTPATIENT_CLINIC_OR_DEPARTMENT_OTHER): Payer: Self-pay | Admitting: Surgery

## 2020-09-08 ENCOUNTER — Other Ambulatory Visit: Payer: Self-pay | Admitting: Surgery

## 2020-09-08 DIAGNOSIS — D241 Benign neoplasm of right breast: Secondary | ICD-10-CM | POA: Insufficient documentation

## 2020-09-08 DIAGNOSIS — N6021 Fibroadenosis of right breast: Secondary | ICD-10-CM | POA: Insufficient documentation

## 2020-09-08 DIAGNOSIS — Z8616 Personal history of COVID-19: Secondary | ICD-10-CM | POA: Insufficient documentation

## 2020-09-08 DIAGNOSIS — N6489 Other specified disorders of breast: Secondary | ICD-10-CM

## 2020-09-08 DIAGNOSIS — Z79899 Other long term (current) drug therapy: Secondary | ICD-10-CM | POA: Insufficient documentation

## 2020-09-08 HISTORY — PX: BREAST LUMPECTOMY WITH RADIOACTIVE SEED LOCALIZATION: SHX6424

## 2020-09-08 HISTORY — PX: BREAST EXCISIONAL BIOPSY: SUR124

## 2020-09-08 LAB — POCT PREGNANCY, URINE: Preg Test, Ur: NEGATIVE

## 2020-09-08 SURGERY — BREAST LUMPECTOMY WITH RADIOACTIVE SEED LOCALIZATION
Anesthesia: General | Site: Breast | Laterality: Right

## 2020-09-08 MED ORDER — FENTANYL CITRATE (PF) 100 MCG/2ML IJ SOLN
INTRAMUSCULAR | Status: DC | PRN
  Administered 2020-09-08: 25 ug via INTRAVENOUS
  Administered 2020-09-08: 50 ug via INTRAVENOUS
  Administered 2020-09-08 (×2): 25 ug via INTRAVENOUS
  Administered 2020-09-08: 50 ug via INTRAVENOUS
  Administered 2020-09-08: 25 ug via INTRAVENOUS

## 2020-09-08 MED ORDER — CHLORHEXIDINE GLUCONATE CLOTH 2 % EX PADS: 6.0000 | MEDICATED_PAD | Freq: Once | CUTANEOUS | Status: DC

## 2020-09-08 MED ORDER — PROPOFOL 10 MG/ML IV BOLUS
INTRAVENOUS | Status: AC
  Filled 2020-09-08: qty 40

## 2020-09-08 MED ORDER — CEFAZOLIN SODIUM-DEXTROSE 2-3 GM-%(50ML) IV SOLR
INTRAVENOUS | Status: DC | PRN
  Administered 2020-09-08: 2 g via INTRAVENOUS

## 2020-09-08 MED ORDER — FENTANYL CITRATE (PF) 100 MCG/2ML IJ SOLN
INTRAMUSCULAR | Status: AC
  Filled 2020-09-08: qty 2

## 2020-09-08 MED ORDER — DEXAMETHASONE SODIUM PHOSPHATE 10 MG/ML IJ SOLN
INTRAMUSCULAR | Status: AC
  Filled 2020-09-08: qty 1

## 2020-09-08 MED ORDER — PROMETHAZINE HCL 25 MG/ML IJ SOLN
INTRAMUSCULAR | Status: AC
  Filled 2020-09-08: qty 1

## 2020-09-08 MED ORDER — BUPIVACAINE-EPINEPHRINE (PF) 0.25% -1:200000 IJ SOLN
INTRAMUSCULAR | Status: AC
  Filled 2020-09-08: qty 30

## 2020-09-08 MED ORDER — LACTATED RINGERS IV SOLN: INTRAVENOUS | Status: DC

## 2020-09-08 MED ORDER — PROPOFOL 500 MG/50ML IV EMUL
INTRAVENOUS | Status: DC | PRN
  Administered 2020-09-08: 250 ug/kg/min via INTRAVENOUS

## 2020-09-08 MED ORDER — DIPHENHYDRAMINE HCL 50 MG/ML IJ SOLN
INTRAMUSCULAR | Status: DC | PRN
  Administered 2020-09-08: 12.5 mg via INTRAVENOUS

## 2020-09-08 MED ORDER — ONDANSETRON HCL 4 MG/2ML IJ SOLN
INTRAMUSCULAR | Status: AC
  Filled 2020-09-08: qty 2

## 2020-09-08 MED ORDER — BUPIVACAINE-EPINEPHRINE 0.5% -1:200000 IJ SOLN
INTRAMUSCULAR | Status: DC | PRN
  Administered 2020-09-08: 20 mL

## 2020-09-08 MED ORDER — CELECOXIB 200 MG PO CAPS
400.0000 mg | ORAL_CAPSULE | ORAL | Status: AC
  Administered 2020-09-08: 200 mg via ORAL

## 2020-09-08 MED ORDER — CEFAZOLIN SODIUM-DEXTROSE 2-4 GM/100ML-% IV SOLN
INTRAVENOUS | Status: AC
  Filled 2020-09-08: qty 100

## 2020-09-08 MED ORDER — SCOPOLAMINE 1 MG/3DAYS TD PT72
MEDICATED_PATCH | TRANSDERMAL | Status: AC
  Filled 2020-09-08: qty 1

## 2020-09-08 MED ORDER — METHYLENE BLUE 0.5 % INJ SOLN
INTRAVENOUS | Status: AC
  Filled 2020-09-08: qty 10

## 2020-09-08 MED ORDER — MIDAZOLAM HCL 2 MG/2ML IJ SOLN
INTRAMUSCULAR | Status: AC
  Filled 2020-09-08: qty 2

## 2020-09-08 MED ORDER — SODIUM CHLORIDE (PF) 0.9 % IJ SOLN
INTRAMUSCULAR | Status: AC
  Filled 2020-09-08: qty 10

## 2020-09-08 MED ORDER — CELECOXIB 200 MG PO CAPS
ORAL_CAPSULE | ORAL | Status: AC
  Filled 2020-09-08: qty 1

## 2020-09-08 MED ORDER — GABAPENTIN 300 MG PO CAPS
ORAL_CAPSULE | ORAL | Status: AC
  Filled 2020-09-08: qty 1

## 2020-09-08 MED ORDER — TRAMADOL HCL 50 MG PO TABS: 50.0000 mg | ORAL_TABLET | Freq: Four times a day (QID) | ORAL | 0 refills | Status: DC | PRN

## 2020-09-08 MED ORDER — LIDOCAINE 2% (20 MG/ML) 5 ML SYRINGE
INTRAMUSCULAR | Status: AC
  Filled 2020-09-08: qty 5

## 2020-09-08 MED ORDER — DEXAMETHASONE SODIUM PHOSPHATE 10 MG/ML IJ SOLN
INTRAMUSCULAR | Status: DC | PRN
  Administered 2020-09-08: 10 mg via INTRAVENOUS

## 2020-09-08 MED ORDER — LIDOCAINE HCL (CARDIAC) PF 100 MG/5ML IV SOSY
PREFILLED_SYRINGE | INTRAVENOUS | Status: DC | PRN
  Administered 2020-09-08: 100 mg via INTRAVENOUS

## 2020-09-08 MED ORDER — PROPOFOL 10 MG/ML IV BOLUS
INTRAVENOUS | Status: DC | PRN
  Administered 2020-09-08: 200 mg via INTRAVENOUS

## 2020-09-08 MED ORDER — FENTANYL CITRATE (PF) 100 MCG/2ML IJ SOLN: 100.0000 ug | Freq: Once | INTRAMUSCULAR | Status: DC

## 2020-09-08 MED ORDER — LACTATED RINGERS IV SOLN: INTRAVENOUS | Status: DC | PRN

## 2020-09-08 MED ORDER — PROMETHAZINE HCL 25 MG/ML IJ SOLN
6.2500 mg | INTRAMUSCULAR | Status: DC | PRN
Start: 2020-09-08 — End: 2020-09-08
  Administered 2020-09-08: 6.25 mg via INTRAVENOUS

## 2020-09-08 MED ORDER — MIDAZOLAM HCL 2 MG/2ML IJ SOLN
INTRAMUSCULAR | Status: DC | PRN
  Administered 2020-09-08: 2 mg via INTRAVENOUS

## 2020-09-08 MED ORDER — SCOPOLAMINE 1 MG/3DAYS TD PT72
1.0000 | MEDICATED_PATCH | TRANSDERMAL | Status: DC
  Administered 2020-09-08: 1.5 mg via TRANSDERMAL

## 2020-09-08 MED ORDER — GABAPENTIN 300 MG PO CAPS
300.0000 mg | ORAL_CAPSULE | ORAL | Status: AC
  Administered 2020-09-08: 300 mg via ORAL

## 2020-09-08 MED ORDER — ACETAMINOPHEN 500 MG PO TABS
1000.0000 mg | ORAL_TABLET | ORAL | Status: AC
  Administered 2020-09-08: 1000 mg via ORAL

## 2020-09-08 MED ORDER — BUPIVACAINE-EPINEPHRINE (PF) 0.5% -1:200000 IJ SOLN
INTRAMUSCULAR | Status: AC
  Filled 2020-09-08: qty 30

## 2020-09-08 MED ORDER — PROPOFOL 500 MG/50ML IV EMUL
INTRAVENOUS | Status: AC
  Filled 2020-09-08: qty 250

## 2020-09-08 MED ORDER — ACETAMINOPHEN 500 MG PO TABS
ORAL_TABLET | ORAL | Status: AC
  Filled 2020-09-08: qty 2

## 2020-09-08 MED ORDER — CEFAZOLIN SODIUM-DEXTROSE 2-4 GM/100ML-% IV SOLN
2.0000 g | INTRAVENOUS | Status: AC
  Administered 2020-09-08: 2 g via INTRAVENOUS

## 2020-09-08 SURGICAL SUPPLY — 48 items
ADH SKN CLS APL DERMABOND .7 (GAUZE/BANDAGES/DRESSINGS) ×1
APL PRP STRL LF DISP 70% ISPRP (MISCELLANEOUS) ×1
APPLIER CLIP 9.375 MED OPEN (MISCELLANEOUS)
APR CLP MED 9.3 20 MLT OPN (MISCELLANEOUS)
BINDER BREAST 3XL (GAUZE/BANDAGES/DRESSINGS) IMPLANT
BINDER BREAST LRG (GAUZE/BANDAGES/DRESSINGS) IMPLANT
BINDER BREAST MEDIUM (GAUZE/BANDAGES/DRESSINGS) IMPLANT
BINDER BREAST XLRG (GAUZE/BANDAGES/DRESSINGS) IMPLANT
BINDER BREAST XXLRG (GAUZE/BANDAGES/DRESSINGS) ×2 IMPLANT
BLADE SURG 15 STRL LF DISP TIS (BLADE) ×1 IMPLANT
BLADE SURG 15 STRL SS (BLADE) ×2
CANISTER SUC SOCK COL 7IN (MISCELLANEOUS) IMPLANT
CANISTER SUCT 1200ML W/VALVE (MISCELLANEOUS) ×2 IMPLANT
CHLORAPREP W/TINT 26 (MISCELLANEOUS) ×2 IMPLANT
CLIP APPLIE 9.375 MED OPEN (MISCELLANEOUS) IMPLANT
COVER BACK TABLE 60X90IN (DRAPES) ×2 IMPLANT
COVER MAYO STAND STRL (DRAPES) ×2 IMPLANT
COVER PROBE W GEL 5X96 (DRAPES) ×2 IMPLANT
COVER WAND RF STERILE (DRAPES) IMPLANT
DECANTER SPIKE VIAL GLASS SM (MISCELLANEOUS) ×2 IMPLANT
DERMABOND ADVANCED (GAUZE/BANDAGES/DRESSINGS) ×1
DERMABOND ADVANCED .7 DNX12 (GAUZE/BANDAGES/DRESSINGS) ×1 IMPLANT
DRAPE LAPAROSCOPIC ABDOMINAL (DRAPES) ×2 IMPLANT
DRAPE UTILITY XL STRL (DRAPES) ×2 IMPLANT
ELECT REM PT RETURN 9FT ADLT (ELECTROSURGICAL) ×2
ELECTRODE REM PT RTRN 9FT ADLT (ELECTROSURGICAL) ×1 IMPLANT
GAUZE SPONGE 4X4 12PLY STRL LF (GAUZE/BANDAGES/DRESSINGS) IMPLANT
GLOVE SURG SIGNA 7.5 PF LTX (GLOVE) ×2 IMPLANT
GOWN STRL REUS W/ TWL LRG LVL3 (GOWN DISPOSABLE) ×3 IMPLANT
GOWN STRL REUS W/ TWL XL LVL3 (GOWN DISPOSABLE) ×1 IMPLANT
GOWN STRL REUS W/TWL LRG LVL3 (GOWN DISPOSABLE) ×6
GOWN STRL REUS W/TWL XL LVL3 (GOWN DISPOSABLE) ×2
KIT MARKER MARGIN INK (KITS) ×2 IMPLANT
NEEDLE HYPO 25X1 1.5 SAFETY (NEEDLE) ×2 IMPLANT
NS IRRIG 1000ML POUR BTL (IV SOLUTION) ×2 IMPLANT
PACK BASIN DAY SURGERY FS (CUSTOM PROCEDURE TRAY) ×2 IMPLANT
PENCIL SMOKE EVACUATOR (MISCELLANEOUS) ×2 IMPLANT
SLEEVE SCD COMPRESS KNEE MED (MISCELLANEOUS) ×2 IMPLANT
SPONGE LAP 4X18 RFD (DISPOSABLE) ×4 IMPLANT
SUT MNCRL AB 4-0 PS2 18 (SUTURE) ×2 IMPLANT
SUT SILK 2 0 SH (SUTURE) IMPLANT
SUT VIC AB 3-0 SH 27 (SUTURE) ×2
SUT VIC AB 3-0 SH 27X BRD (SUTURE) ×1 IMPLANT
SYR CONTROL 10ML LL (SYRINGE) ×2 IMPLANT
TOWEL GREEN STERILE FF (TOWEL DISPOSABLE) ×2 IMPLANT
TRAY FAXITRON CT DISP (TRAY / TRAY PROCEDURE) ×2 IMPLANT
TUBE CONNECTING 20X1/4 (TUBING) ×2 IMPLANT
YANKAUER SUCT BULB TIP NO VENT (SUCTIONS) ×2 IMPLANT

## 2020-09-08 NOTE — Interval H&P Note (Signed)
History and Physical Interval Note: no change in H and P  09/08/2020 7:09 AM  [name]  has presented today for surgery, with the diagnosis of RIGHT BREAST PAPILLOMA AND COMPLEX SCLEROSING LESION X 2.  The various methods of treatment have been discussed with the patient and family. After consideration of risks, benefits and other options for treatment, the patient has consented to  Procedure(s) with comments: RIGHT BREAST LUMPECTOMY  X 3 WITH RADIOACTIVE SEED LOCALIZATION (Right) - LMA as a surgical intervention.  The patient's history has been reviewed, patient examined, no change in status, stable for surgery.  I have reviewed the patient's chart and labs.  Questions were answered to the patient's satisfaction.     Coralie Keens

## 2020-09-08 NOTE — Discharge Instructions (Signed)
[place] Office Phone Number [phone]  BREAST BIOPSY/ PARTIAL MASTECTOMY: POST OP INSTRUCTIONS  Always review your discharge instruction sheet given to you by the facility where your surgery was performed.  IF YOU HAVE DISABILITY OR FAMILY LEAVE FORMS, YOU MUST BRING THEM TO THE OFFICE FOR PROCESSING.  DO NOT GIVE THEM TO YOUR DOCTOR.  1. A prescription for pain medication may be given to you upon discharge.  Take your pain medication as prescribed, if needed.  If narcotic pain medicine is not needed, then you may take acetaminophen (Tylenol) or ibuprofen (Advil) as needed. 2. Take your usually prescribed medications unless otherwise directed 3. If you need a refill on your pain medication, please contact your pharmacy.  They will contact our office to request authorization.  Prescriptions will not be filled after 5pm or on week-ends. 4. You should eat very light the first 24 hours after surgery, such as soup, crackers, pudding, etc.  Resume your normal diet the day after surgery. 5. Most patients will experience some swelling and bruising in the breast.  Ice packs and a good support bra will help.  Swelling and bruising can take several days to resolve.  6. It is common to experience some constipation if taking pain medication after surgery.  Increasing fluid intake and taking a stool softener will usually help or prevent this problem from occurring.  A mild laxative (Milk of Magnesia or Miralax) should be taken according to package directions if there are no bowel movements after 48 hours. 7. Unless discharge instructions indicate otherwise, you may remove your bandages 24-48 hours after surgery, and you may shower at that time.  You may have steri-strips (small skin tapes) in place directly over the incision.  These strips should be left on the skin for 7-10 days.  If your surgeon used skin glue on the incision, you may shower in 24 hours.  The glue will flake off over the  next 2-3 weeks.  Any sutures or staples will be removed at the office during your follow-up visit. 8. ACTIVITIES:  You may resume regular daily activities (gradually increasing) beginning the next day.  Wearing a good support bra or sports bra minimizes pain and swelling.  You may have sexual intercourse when it is comfortable. a. You may drive when you no longer are taking prescription pain medication, you can comfortably wear a seatbelt, and you can safely maneuver your car and apply brakes. b. RETURN TO WORK:  ________ONE WEEK c. ______________________________________________________________________________ 9. You should see your doctor in the office for a follow-up appointment approximately two weeks after your surgery.  Your doctor's nurse will typically make your follow-up appointment when she calls you with your pathology report.  Expect your pathology report 2-3 business days after your surgery.  You may call to check if you do not hear from [place] after three days. 10. OTHER INSTRUCTIONS:OK TO REMOVE THE BINDER AND SHOWER STARTING TOMORROW 11. ICE PACK, TYLENOL, AND IBUPROFEN ALSO FOR PAIN 12. NO VIGOROUS ACTIVITY FOR ONE WEEK _______________________________________________________________________________________________ _____________________________________________________________________________________________________________________________________ _____________________________________________________________________________________________________________________________________ _____________________________________________________________________________________________________________________________________  WHEN TO CALL YOUR DOCTOR: 1. Fever over 101.0 2. Nausea and/or vomiting. 3. Extreme swelling or bruising. 4. Continued bleeding from incision. 5. Increased pain, redness, or drainage from the incision.  The clinic staff is available to answer your questions during regular business  hours.  Please don't hesitate to call and ask to speak to one of the nurses for clinical concerns.  If you have a medical emergency, go to the nearest  emergency room or call 911.  A surgeon from [facility] is always on call at the hospital.  For further questions, please visit [email or web address]    Post Anesthesia Home Care Instructions  Activity: Get plenty of rest for the remainder of the day. A responsible individual must stay with you for 24 hours following the procedure.  For the next 24 hours, DO NOT: -Drive a car -Paediatric nurse -Drink alcoholic beverages -Take any medication unless instructed by your physician -Make any legal decisions or sign important papers.  Meals: Start with liquid foods such as gelatin or soup. Progress to regular foods as tolerated. Avoid greasy, spicy, heavy foods. If nausea and/or vomiting occur, drink only clear liquids until the nausea and/or vomiting subsides. Call your physician if vomiting continues.  Special Instructions/Symptoms: Your throat may feel dry or sore from the anesthesia or the breathing tube placed in your throat during surgery. If this causes discomfort, gargle with warm salt water. The discomfort should disappear within 24 hours.  If you had a scopolamine patch placed behind your ear for the management of post- operative nausea and/or vomiting:  1. The medication in the patch is effective for 72 hours, after which it should be removed.  Wrap patch in a tissue and discard in the trash. Wash hands thoroughly with soap and water. 2. You may remove the patch earlier than 72 hours if you experience unpleasant side effects which may include dry mouth, dizziness or visual disturbances. 3. Avoid touching the patch. Wash your hands with soap and water after contact with the patch.

## 2020-09-08 NOTE — Transfer of Care (Signed)
Immediate Anesthesia Transfer of Care Note  Patient: [name]  Procedure(s) Performed: RIGHT BREAST LUMPECTOMY  X 3 WITH RADIOACTIVE SEED LOCALIZATION (Right Breast)  Patient Location: PACU  Anesthesia Type:General  Level of Consciousness: awake, alert  and oriented  Airway & Oxygen Therapy: Patient Spontanous Breathing and Patient connected to face mask oxygen  Post-op Assessment: Report given to RN and Post -op Vital signs reviewed and stable  Post vital signs: Reviewed and stable  Last Vitals:  Vitals Value Taken Time  BP 162/83 09/08/20 0915  Temp 36.7 C 09/08/20 0849  Pulse 72 09/08/20 0917  Resp 12 09/08/20 0917  SpO2 100 % 09/08/20 0917  Vitals shown include unvalidated device data.  Last Pain:  Vitals:   09/08/20 0900  TempSrc:   PainSc: 6       Patients Stated Pain Goal: 4 (72/09/47 0962)  Complications: No complications documented.

## 2020-09-08 NOTE — Op Note (Signed)
   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  She has had multiple biopsies on both sides.  She has had fibroadenomas biopsied.  On the right breast, she has intraductal papilloma as well as 2 complex sclerosing lesions.  The decision is made to proceed with lumpectomy to remove these 3 areas  Procedure: The patient was brought to operating identifies correct patient.  She was placed upon the operating table and general anesthesia was induced.  Her right breast was prepped and draped in usual sterile fashion.  One of the seeds marking the papilloma was at approximate the 6 o'clock position just underneath the areola.  The other 2 seeds were localized in the more lateral right breast.  I anesthetized skin at the lateral edge of the areola with Marcaine.  I then made incision with a scalpel.  Using neoprobe identified the first radioactive seed which marked the papilloma.  I performed a lumpectomy staying around this with the electrocautery and the neoprobe.  Once the specimen was removed I marked the margins with pain.  An x-ray was performed confirming the radioactive seed and previous biopsy marker in the specimen.  Again this was the papilloma.  It was sent to pathology. Next I was able to identify both radioactive seeds at the complex sclerosing lesions in the right breast.  One was more lateral and one was more medial and deeper.  Using neoprobe identified the more lateral seed and excised it  in its entirety.  I marked its margins with paint.  The x-ray of the specimen revealed that the seed and tissue marker were in the specimen.  I then identified the more medial deeper seed which is near the chest wall.  Identified with the neoprobe.  I likewise again performed a lumpectomy with the aid with the cautery staying around the seed.  The specimen was also marked and x-rayed confirming that the radioactive seed and tissue marker in the third specimen.  At this point hemostasis appeared to be achieved with the cautery.  I anesthetized the incision further with Marcaine.  I closed with the deep tissue to the chest wall with interrupted 3-0 Vicryl sutures.  The subcutaneous tissue was then closed interrupted 3-0 Vicryl sutures and skin was closed with running 4-0 Monocryl.  Dermabond was then applied.  The patient tolerated the procedure well.  All the counts were correct at the end of the procedure.  The patient was then extubated in the operating room and taken in stable condition to the recovery room in a breast binder.          Coralie Keens   Date: 09/08/2020  Time: 8:34 AM

## 2020-09-08 NOTE — Anesthesia Procedure Notes (Signed)
Procedure Name: LMA Insertion Performed by: Verita Lamb, CRNA Pre-anesthesia Checklist: Patient identified, Emergency Drugs available, Suction available and Patient being monitored Patient Re-evaluated:Patient Re-evaluated prior to induction Oxygen Delivery Method: Circle system utilized Preoxygenation: Pre-oxygenation with 100% oxygen Induction Type: IV induction Ventilation: Mask ventilation without difficulty LMA: LMA inserted LMA Size: 4.0 Number of attempts: 1 Airway Equipment and Method: Bite block Placement Confirmation: positive ETCO2,  CO2 detector and breath sounds checked- equal and bilateral Tube secured with: Tape Dental Injury: Teeth and Oropharynx as per pre-operative assessment

## 2020-09-08 NOTE — Anesthesia Postprocedure Evaluation (Signed)
Anesthesia Post Note  Patient: [name]  Procedure(s) Performed: RIGHT BREAST LUMPECTOMY  X 3 WITH RADIOACTIVE SEED LOCALIZATION (Right Breast)     Patient location during evaluation: PACU Anesthesia Type: General Level of consciousness: sedated and patient cooperative Pain management: pain level controlled Vital Signs Assessment: post-procedure vital signs reviewed and stable Respiratory status: spontaneous breathing Cardiovascular status: stable Anesthetic complications: no   No complications documented.  Last Vitals:  Vitals:   09/08/20 0945 09/08/20 1003  BP: (!) 164/78 132/88  Pulse: 66 70  Resp: 13 17  Temp:  37.2 C  SpO2: 100% 100%    Last Pain:  Vitals:   09/08/20 1003  TempSrc:   PainSc: [place]

## 2020-09-11 ENCOUNTER — Encounter (HOSPITAL_BASED_OUTPATIENT_CLINIC_OR_DEPARTMENT_OTHER): Payer: Self-pay | Admitting: Surgery

## 2020-09-11 LAB — SURGICAL PATHOLOGY

## 2020-10-13 ENCOUNTER — Other Ambulatory Visit: Payer: Self-pay | Admitting: Nurse Practitioner

## 2020-10-13 DIAGNOSIS — E6609 Other obesity due to excess calories: Secondary | ICD-10-CM

## 2020-10-18 ENCOUNTER — Other Ambulatory Visit: Payer: Self-pay

## 2020-10-18 ENCOUNTER — Ambulatory Visit (INDEPENDENT_AMBULATORY_CARE_PROVIDER_SITE_OTHER): Payer: BC Managed Care – PPO | Admitting: Nurse Practitioner

## 2020-10-18 ENCOUNTER — Encounter: Payer: Self-pay | Admitting: Nurse Practitioner

## 2020-10-18 VITALS — BP 124/80 | HR 84 | Temp 98.2°F | Ht 63.8 in | Wt 209.4 lb

## 2020-10-18 DIAGNOSIS — I10 Essential (primary) hypertension: Secondary | ICD-10-CM | POA: Diagnosis not present

## 2020-10-18 DIAGNOSIS — K5900 Constipation, unspecified: Secondary | ICD-10-CM | POA: Diagnosis not present

## 2020-10-18 DIAGNOSIS — E785 Hyperlipidemia, unspecified: Secondary | ICD-10-CM

## 2020-10-18 DIAGNOSIS — R7309 Other abnormal glucose: Secondary | ICD-10-CM

## 2020-10-18 DIAGNOSIS — Z6833 Body mass index (BMI) 33.0-33.9, adult: Secondary | ICD-10-CM

## 2020-10-18 DIAGNOSIS — N6310 Unspecified lump in the right breast, unspecified quadrant: Secondary | ICD-10-CM

## 2020-10-18 DIAGNOSIS — E6609 Other obesity due to excess calories: Secondary | ICD-10-CM

## 2020-10-18 DIAGNOSIS — E669 Obesity, unspecified: Secondary | ICD-10-CM

## 2020-10-18 MED ORDER — LUBIPROSTONE 8 MCG PO CAPS: 8.0000 ug | ORAL_CAPSULE | Freq: Every day | ORAL | 1 refills | Status: DC

## 2020-10-18 MED ORDER — PHENTERMINE HCL 37.5 MG PO TABS: 37.5000 mg | ORAL_TABLET | Freq: Every day | ORAL | 1 refills | Status: DC

## 2020-10-18 NOTE — Patient Instructions (Signed)

## 2020-10-18 NOTE — Progress Notes (Signed)
I,Yamilka Roman [employer] as a Education administrator for [employer], FNP.,have documented all relevant documentation on the behalf of [name], FNP,as directed by  [name], FNP while in the presence of [name], [place]. This visit occurred during the SARS-CoV-2 public health emergency.  Safety protocols were in place, including screening questions prior to the visit, additional usage of staff PPE, and extensive cleaning of exam room while observing appropriate contact time as indicated for disinfecting solutions.  Subjective:     Patient ID: [name] , female    DOB: 10/23/1975 , 45 y.o.   MRN: [id number]   Chief Complaint  Patient presents with  . Hypertension  . Weight Check    HPI  Patient presents today for a blood pressure and weight check.    Wt Readings from Last 3 Encounters: 10/18/20 : 209 lb 6.4 oz (95 kg) 09/08/20 : 215 lb 9.8 oz (97.8 kg) 08/02/20 : 214 lb 6.4 oz (97.3 kg)  She had surgery on 2/4 on her breast right side. She has gone to [facility] to see about the facilities.   10/8 and 10/29 Pfizer  Hypertension This is a chronic problem. The current episode started more than 1 year ago. The problem has been gradually improving since onset. The problem is controlled. Pertinent negatives include no anxiety. There are no associated agents to hypertension. Risk factors for coronary artery disease include obesity. Past treatments include diuretics and angiotensin blockers.  Constipation This is a chronic problem. The current episode started more than 1 month ago. The patient is on a high fiber diet. She does not exercise regularly (does exercise a little bit). There has not been adequate water intake. Pertinent negatives include no abdominal pain.     Past Medical History:  Diagnosis Date  . Acute bronchitis 09/11/2018  . Acute calculous cholecystitis 09/11/2018  . Acute dermatitis 09/11/2018  . Acute maxillary sinusitis 09/11/2018  . Acute urinary tract  infection 09/11/2018  . Anemia   . 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 Hyperlipidemia    patient denies  . Hypertension   . Mass    on rt inner thigh  . Trichomonal vaginitis 09/11/2018     Family History  Problem Relation Age of Onset  . COPD Mother   . Healthy Father      Current Outpatient Medications:  .  lubiprostone (AMITIZA) 8 MCG capsule, Take 1 capsule (8 mcg total) by mouth daily with breakfast., Disp: 90 capsule, Rfl: 1 .  NUVARING 0.12-0.015 MG/24HR vaginal ring, Place 1 application vaginally every 30 (thirty) days., Disp: , Rfl: 11 .  olmesartan-hydrochlorothiazide (BENICAR HCT) 40-25 MG tablet, TAKE 1 TABLET BY MOUTH EVERY DAY, Disp: 90 tablet, Rfl: 1 .  phentermine (ADIPEX-P) 37.5 MG tablet, Take 1 tablet (37.5 mg total) by mouth daily before breakfast., Disp: 30 tablet, Rfl: 1   Allergies  Allergen Reactions  . Hydrocodone-Acetaminophen Nausea And Vomiting     Review of Systems  Constitutional: Negative.   HENT: Negative.   Eyes: Negative.   Respiratory: Negative.   Cardiovascular: Negative.   Gastrointestinal: Positive for constipation. Negative for abdominal pain.  Endocrine: Negative.   Genitourinary: Negative.        Reported that a lump was seen in her right breast by Dr. Ninfa Linden   Musculoskeletal: Negative.   Skin: Negative.   Neurological: Negative.   Hematological: Negative.   Psychiatric/Behavioral: Negative.      Today's Vitals   10/18/20 1030  BP: 124/80  Pulse: 84  Temp: 98.2 F (36.8 C)  Weight: 209 lb 6.4 oz (95 kg)  Height: 5' 3.8" (1.621 m)  PainSc: 0-No pain   Body mass index is 36.17 kg/m.   Objective:  Physical Exam Constitutional:      General: She is not in acute distress.    Appearance: Normal appearance. She is obese.  Cardiovascular:     Rate and Rhythm: Normal rate and regular rhythm.     Pulses: Normal pulses.     Heart sounds: Normal heart sounds. No murmur heard.   Pulmonary:     Effort:  Pulmonary effort is normal. No respiratory distress.     Breath sounds: Normal breath sounds. No wheezing.  Skin:    General: Skin is warm and dry.     Capillary Refill: Capillary refill takes less than 2 seconds.     Coloration: Skin is not jaundiced.     Findings: No bruising.  Neurological:     General: No focal deficit present.     Mental Status: She is alert and oriented to person, place, and time.     Cranial Nerves: No cranial nerve deficit.  Psychiatric:        Mood and Affect: Mood normal.        Behavior: Behavior normal.        Thought Content: Thought content normal.        Judgment: Judgment normal.         Assessment And Plan:     1. Essential hypertension . B/P is well controlled.  . CMP ordered to check renal function.  . The importance of regular exercise and dietary modification was stressed to the patient.  . Stressed importance of losing ten percent of her body weight to help with B/P control.   2. Constipation, unspecified constipation type  Will try her on amitiza   She is encouraged to increase her water intake and increase fiber - lubiprostone (AMITIZA) 8 MCG capsule; Take 1 capsule (8 mcg total) by mouth daily with breakfast.  Dispense: 90 capsule; Refill: 1  3. Obesity (BMI 35.0-39.9 without comorbidity)  Chronic  Discussed healthy diet and regular exercise options   Encouraged to exercise at least 150 minutes per week with 2 days of strength training  Continue phentermine, we may need to consider other weight loss options due to length of time on phentermine. She is encouraged to avoid starting and stopping medications to cause a rebound weight gain   Return in 2 months for weight check.  4. Elevated lipids  Chronic, controlled  No current medications, diet controlled - Lipid panel  5. Abnormal glucose  Chronic, stable  No current medications  Encouraged to limit intake of sugary foods and drinks  Encouraged to increase physical  activity to 150 minutes per week - Hemoglobin A1c  6. Lump of right breast  She has a lump to her right breast and being followed by Dr. Ninfa Linden     Patient was given opportunity to ask questions. Patient verbalized understanding of the plan and was able to repeat key elements of the plan. All questions were answered to their satisfaction.  [name], FNP   I, [name], FNP, have reviewed all documentation for this visit. The documentation on 10/18/20 for the exam, diagnosis, procedures, and orders are all accurate and complete.   IF YOU HAVE BEEN REFERRED TO A SPECIALIST, IT MAY TAKE 1-2 WEEKS TO SCHEDULE/PROCESS THE REFERRAL. IF YOU HAVE NOT HEARD FROM US/SPECIALIST  IN TWO WEEKS, PLEASE GIVE [place] A CALL AT [phone].   THE PATIENT IS ENCOURAGED TO PRACTICE SOCIAL DISTANCING DUE TO THE COVID-19 PANDEMIC.

## 2020-10-19 LAB — LIPID PANEL
Chol/HDL Ratio: 3 ratio (ref 0.0–4.4)
Cholesterol, Total: 197 mg/dL (ref 100–199)
HDL: 65 mg/dL (ref >39)
LDL Chol Calc (NIH): 125 mg/dL — ABNORMAL HIGH (ref 0–99)
Triglycerides: 35 mg/dL (ref 0–149)
VLDL Cholesterol Cal: 7 mg/dL (ref 5–40)

## 2020-10-19 LAB — HEMOGLOBIN A1C
Est. average glucose Bld gHb Est-mCnc: 114 mg/dL
Hgb A1c MFr Bld: 5.6 % (ref 4.8–5.6)

## 2020-10-20 ENCOUNTER — Encounter: Payer: Self-pay | Admitting: Nurse Practitioner

## 2020-11-01 ENCOUNTER — Ambulatory Visit
Admission: RE | Admit: 2020-11-01 | Discharge: 2020-11-01 | Disposition: A | Discharge status: Home / Self Care | Payer: BC Managed Care – PPO | Source: Ambulatory Visit | Attending: Obstetrics and Gynecology | Admitting: Obstetrics and Gynecology

## 2020-11-01 ENCOUNTER — Other Ambulatory Visit: Payer: Self-pay

## 2020-11-01 ENCOUNTER — Other Ambulatory Visit: Payer: Self-pay | Admitting: Surgery

## 2020-11-01 DIAGNOSIS — Z9011 Acquired absence of right breast and nipple: Secondary | ICD-10-CM

## 2020-11-01 DIAGNOSIS — N6310 Unspecified lump in the right breast, unspecified quadrant: Secondary | ICD-10-CM

## 2020-11-01 DIAGNOSIS — D242 Benign neoplasm of left breast: Secondary | ICD-10-CM

## 2020-11-07 ENCOUNTER — Ambulatory Visit: Payer: BC Managed Care – PPO

## 2020-11-07 ENCOUNTER — Ambulatory Visit
Admission: RE | Admit: 2020-11-07 | Discharge: 2020-11-07 | Disposition: A | Discharge status: Home / Self Care | Payer: BC Managed Care – PPO | Source: Ambulatory Visit | Attending: Surgery | Admitting: Surgery

## 2020-11-07 ENCOUNTER — Other Ambulatory Visit: Payer: Self-pay

## 2020-11-07 DIAGNOSIS — Z9011 Acquired absence of right breast and nipple: Secondary | ICD-10-CM

## 2020-11-15 ENCOUNTER — Other Ambulatory Visit: Payer: Self-pay | Admitting: Surgery

## 2020-11-15 DIAGNOSIS — N644 Mastodynia: Secondary | ICD-10-CM

## 2020-11-17 ENCOUNTER — Ambulatory Visit
Admission: RE | Admit: 2020-11-17 | Discharge: 2020-11-17 | Disposition: A | Discharge status: Home / Self Care | Payer: BC Managed Care – PPO | Source: Ambulatory Visit | Attending: Surgery | Admitting: Surgery

## 2020-11-17 ENCOUNTER — Other Ambulatory Visit: Payer: Self-pay

## 2020-11-17 DIAGNOSIS — N644 Mastodynia: Secondary | ICD-10-CM

## 2020-12-08 ENCOUNTER — Other Ambulatory Visit: Payer: BC Managed Care – PPO

## 2020-12-19 ENCOUNTER — Ambulatory Visit: Payer: BC Managed Care – PPO | Admitting: Nurse Practitioner

## 2020-12-19 ENCOUNTER — Ambulatory Visit (INDEPENDENT_AMBULATORY_CARE_PROVIDER_SITE_OTHER): Payer: BC Managed Care – PPO | Admitting: Nurse Practitioner

## 2020-12-19 ENCOUNTER — Encounter: Payer: Self-pay | Admitting: Nurse Practitioner

## 2020-12-19 ENCOUNTER — Other Ambulatory Visit: Payer: Self-pay

## 2020-12-19 VITALS — BP 118/78 | HR 98 | Temp 97.8°F | Ht 63.0 in | Wt 209.6 lb

## 2020-12-19 DIAGNOSIS — Z6837 Body mass index (BMI) 37.0-37.9, adult: Secondary | ICD-10-CM | POA: Diagnosis not present

## 2020-12-19 DIAGNOSIS — K5909 Other constipation: Secondary | ICD-10-CM

## 2020-12-19 DIAGNOSIS — E669 Obesity, unspecified: Secondary | ICD-10-CM | POA: Diagnosis not present

## 2020-12-19 DIAGNOSIS — I10 Essential (primary) hypertension: Secondary | ICD-10-CM

## 2020-12-19 MED ORDER — PHENTERMINE HCL 37.5 MG PO TABS: 37.5000 mg | ORAL_TABLET | Freq: Every day | ORAL | 1 refills | Status: DC

## 2020-12-19 MED ORDER — OLMESARTAN MEDOXOMIL-HCTZ 40-25 MG PO TABS: 1.0000 | ORAL_TABLET | Freq: Every day | ORAL | 1 refills | Status: DC

## 2020-12-19 NOTE — Patient Instructions (Signed)

## 2020-12-19 NOTE — Progress Notes (Signed)
I,Debbra Digiulio,acting as a Education administrator for [name], FNP.,have documented all relevant documentation on the behalf of [name], FNP,as directed by  [name], FNP while in the presence of [name], [place]. This visit occurred during the SARS-CoV-2 public health emergency.  Safety protocols were in place, including screening questions prior to the visit, additional usage of staff PPE, and extensive cleaning of exam room while observing appropriate contact time as indicated for disinfecting solutions.  Subjective:     Patient ID: [name] , female    DOB: 1976-05-04 , 45 y.o.   MRN: [id number]   Chief Complaint  Patient presents with  . Weight Check    HPI  Patient presents today for a blood pressure and weight check.    Wt Readings from Last 3 Encounters: 12/19/20 : 209 lb 9.6 oz (95.1 kg) 10/18/20 : 209 lb 6.4 oz (95 kg) 09/08/20 : 215 lb 9.8 oz (97.8 kg)  She had been out of phentermine for about 1 month, she is exercising a little bit.  When she eats more sugar she has more constipation. She does feel like it is better.  She had a papiloma to her right breast which is causing her discomfort to her right breast and has been out of work since February, her next appt is June 10th - she is out of work until then   Hypertension This is a chronic problem. The current episode started more than 1 year ago. The problem has been gradually improving since onset. The problem is controlled. Pertinent negatives include no anxiety, chest pain, headaches or palpitations. There are no associated agents to hypertension. Risk factors for coronary artery disease include obesity. Past treatments include diuretics and angiotensin blockers. There are no compliance problems.  There is no history of angina. There is no history of chronic renal disease.  Constipation This is a chronic problem. The current episode started more than 1 month ago. The patient is on a high fiber diet. She does not  exercise regularly (does exercise a little bit). There has not been adequate water intake. Pertinent negatives include no abdominal pain.     Past Medical History:  Diagnosis Date  . Acute bronchitis 09/11/2018  . Acute calculous cholecystitis 09/11/2018  . Acute dermatitis 09/11/2018  . Acute maxillary sinusitis 09/11/2018  . Acute urinary tract infection 09/11/2018  . Anemia   . 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 Hyperlipidemia    patient denies  . Hypertension   . Mass    on rt inner thigh  . Trichomonal vaginitis 09/11/2018     Family History  Problem Relation Age of Onset  . COPD Mother   . Healthy Father      Current Outpatient Medications:  .  lubiprostone (AMITIZA) 8 MCG capsule, Take 1 capsule (8 mcg total) by mouth daily with breakfast., Disp: 90 capsule, Rfl: 1 .  NUVARING 0.12-0.015 MG/24HR vaginal ring, Place 1 application vaginally every 30 (thirty) days., Disp: , Rfl: 11 .  olmesartan-hydrochlorothiazide (BENICAR HCT) 40-25 MG tablet, Take 1 tablet by mouth daily., Disp: 90 tablet, Rfl: 1 .  phentermine (ADIPEX-P) 37.5 MG tablet, Take 1 tablet (37.5 mg total) by mouth daily before breakfast., Disp: 30 tablet, Rfl: 1   Allergies  Allergen Reactions  . Hydrocodone-Acetaminophen Nausea And Vomiting     Review of Systems  Constitutional: Negative.   Respiratory: Negative.   Cardiovascular: Negative.  Negative for chest pain, palpitations and leg swelling.  Gastrointestinal: Negative for  abdominal pain and constipation.  Neurological: Negative.  Negative for dizziness and headaches.  Psychiatric/Behavioral: Negative.      Today's Vitals   12/19/20 1401  BP: 118/78  Pulse: 98  Temp: 97.8 F (36.6 C)  SpO2: 99%  Weight: 209 lb 9.6 oz (95.1 kg)  Height: 5\' 3"  (1.6 m)   Body mass index is 37.13 kg/m.  Wt Readings from Last 3 Encounters:  12/19/20 209 lb 9.6 oz (95.1 kg)  10/18/20 209 lb 6.4 oz (95 kg)  09/08/20 215 lb 9.8 oz (97.8 kg)   Objective:   Physical Exam Vitals reviewed.  Constitutional:      General: She is not in acute distress.    Appearance: Normal appearance. She is obese.  Cardiovascular:     Rate and Rhythm: Normal rate and regular rhythm.     Pulses: Normal pulses.     Heart sounds: Normal heart sounds. No murmur heard.   Pulmonary:     Effort: Pulmonary effort is normal. No respiratory distress.     Breath sounds: Normal breath sounds. No wheezing.  Skin:    General: Skin is warm and dry.     Capillary Refill: Capillary refill takes less than 2 seconds.     Coloration: Skin is not jaundiced.     Findings: No bruising.  Neurological:     General: No focal deficit present.     Mental Status: She is alert and oriented to person, place, and time.     Cranial Nerves: No cranial nerve deficit.  Psychiatric:        Mood and Affect: Mood normal.        Behavior: Behavior normal.        Thought Content: Thought content normal.        Judgment: Judgment normal.         Assessment And Plan:     1. Obesity (BMI 35.0-39.9 without comorbidity)  She had been out of her phentermine for approximately 1 month, restarted 1-2 weeks ago  Encouraged to increase physical activity  Return in 2 months for weight check  Continue phentermine if not much weight loss will consider other options  She did not tolerate saxenda well - phentermine (ADIPEX-P) 37.5 MG tablet; Take 1 tablet (37.5 mg total) by mouth daily before breakfast.  Dispense: 30 tablet; Refill: 1  2. Essential hypertension . B/P is well controlled.  . Continue with current medications, tolerating well  . The importance of regular exercise and dietary modification was stressed to the patient.   - olmesartan-hydrochlorothiazide (BENICAR HCT) 40-25 MG tablet; Take 1 tablet by mouth daily.  Dispense: 90 tablet; Refill: 1  3. Other constipation  Improving, this is worse when she eats an increase in sugar   Patient was given opportunity to ask  questions. Patient verbalized understanding of the plan and was able to repeat key elements of the plan. All questions were answered to their satisfaction.  [name], FNP   I, [name], FNP, have reviewed all documentation for this visit. The documentation on 12/19/20 for the exam, diagnosis, procedures, and orders are all accurate and complete.   IF YOU HAVE BEEN REFERRED TO A SPECIALIST, IT MAY TAKE 1-2 WEEKS TO SCHEDULE/PROCESS THE REFERRAL. IF YOU HAVE NOT HEARD FROM US/SPECIALIST IN TWO WEEKS, PLEASE GIVE [place] A CALL AT [phone].   THE PATIENT IS ENCOURAGED TO PRACTICE SOCIAL DISTANCING DUE TO THE COVID-19 PANDEMIC.

## 2021-01-17 ENCOUNTER — Other Ambulatory Visit: Payer: Self-pay | Admitting: Surgery

## 2021-01-17 DIAGNOSIS — N644 Mastodynia: Secondary | ICD-10-CM

## 2021-01-18 ENCOUNTER — Other Ambulatory Visit: Payer: Self-pay | Admitting: Obstetrics and Gynecology

## 2021-01-18 DIAGNOSIS — D250 Submucous leiomyoma of uterus: Secondary | ICD-10-CM

## 2021-01-27 ENCOUNTER — Other Ambulatory Visit: Payer: BC Managed Care – PPO

## 2021-01-30 ENCOUNTER — Other Ambulatory Visit: Payer: Self-pay

## 2021-01-30 ENCOUNTER — Ambulatory Visit
Admission: RE | Admit: 2021-01-30 | Discharge: 2021-01-30 | Disposition: A | Discharge status: Home / Self Care | Payer: BC Managed Care – PPO | Source: Ambulatory Visit | Attending: Surgery | Admitting: Surgery

## 2021-01-30 DIAGNOSIS — N644 Mastodynia: Secondary | ICD-10-CM

## 2021-01-30 MED ORDER — GADOBUTROL 1 MMOL/ML IV SOLN
10.0000 mL | Freq: Once | INTRAVENOUS | Status: AC | PRN
  Administered 2021-01-30: 10 mL via INTRAVENOUS

## 2021-02-01 ENCOUNTER — Other Ambulatory Visit: Payer: BC Managed Care – PPO

## 2021-02-12 ENCOUNTER — Ambulatory Visit
Admission: EM | Admit: 2021-02-12 | Discharge: 2021-02-12 | Disposition: A | Discharge status: Home / Self Care | Payer: BC Managed Care – PPO | Attending: Emergency Medicine | Admitting: Emergency Medicine

## 2021-02-12 ENCOUNTER — Encounter: Payer: Self-pay | Admitting: Emergency Medicine

## 2021-02-12 ENCOUNTER — Other Ambulatory Visit: Payer: Self-pay

## 2021-02-12 DIAGNOSIS — W57XXXA Bitten or stung by nonvenomous insect and other nonvenomous arthropods, initial encounter: Secondary | ICD-10-CM

## 2021-02-12 DIAGNOSIS — R062 Wheezing: Secondary | ICD-10-CM

## 2021-02-12 DIAGNOSIS — S80861A Insect bite (nonvenomous), right lower leg, initial encounter: Secondary | ICD-10-CM

## 2021-02-12 DIAGNOSIS — R059 Cough, unspecified: Secondary | ICD-10-CM | POA: Diagnosis not present

## 2021-02-12 MED ORDER — BENZONATATE 200 MG PO CAPS: 200.0000 mg | ORAL_CAPSULE | Freq: Three times a day (TID) | ORAL | 0 refills | Status: AC | PRN

## 2021-02-12 MED ORDER — CEPHALEXIN 500 MG PO CAPS: 500.0000 mg | ORAL_CAPSULE | Freq: Four times a day (QID) | ORAL | 0 refills | Status: AC

## 2021-02-12 MED ORDER — DM-GUAIFENESIN ER 30-600 MG PO TB12: 1.0000 | ORAL_TABLET | Freq: Two times a day (BID) | ORAL | 0 refills | Status: DC

## 2021-02-12 MED ORDER — TRIAMCINOLONE ACETONIDE 0.1 % EX CREA: 1.0000 "application " | TOPICAL_CREAM | Freq: Two times a day (BID) | CUTANEOUS | 0 refills | Status: DC

## 2021-02-12 MED ORDER — ALBUTEROL SULFATE HFA 108 (90 BASE) MCG/ACT IN AERS: 1.0000 | INHALATION_SPRAY | Freq: Four times a day (QID) | RESPIRATORY_TRACT | 0 refills | Status: DC | PRN

## 2021-02-12 NOTE — ED Triage Notes (Signed)
Patient presents to [facility] for evaluation of insect bite that happened Thursday.  Denies known source.  HAs benadryl on board, buit c/o itching, pain, redness and swelling

## 2021-02-12 NOTE — Discharge Instructions (Addendum)
Please continue cool compresses and elevating leg Use triamcinolone twice daily to area Keflex 4 times daily x5 days to help with any underlying infection Daily cetirizine/Zyrtec or loratadine/Claritin during the day, Benadryl in the evening  Please use Mucinex twice daily Tessalon every 8 hours for cough Albuterol inhaler as needed for shortness of breath chest tightness, wheezing  Please follow-up if any symptoms not improving or worsening

## 2021-02-12 NOTE — ED Provider Notes (Signed)
[facility]    CSN: [id number] Arrival date & time: 02/12/21  1202      History   Chief Complaint Chief Complaint  Patient presents with   Insect Bite    HPI [name] is a 45 y.o. female history of hypertension, presenting today for evaluation of cough and insect bite.  Reports Thursday felt a stinging sensation to her right inner knee.  Since has had intermittent itching pain swelling and redness.  Concerned as symptoms have persisted over the past 4 to 5 days.  Denies fevers.  Has been using Benadryl, topical hydrocortisone elevation and ice.  Symptoms waxing and waning with use of Benadryl.  Denies difficulty bending knee.  Denies rash elsewhere or any difficulty breathing.  She does report cough congestion and wheezing.  Reports that this is normal for her as she has underlying bronchitis.  Reports being out of albuterol inhaler.  Wheezing mainly at nighttime.  Requesting medicine for this.  Would like to defer any oral steroids.  HPI  Past Medical History:  Diagnosis Date   Acute bronchitis 09/11/2018   Acute calculous cholecystitis 09/11/2018   Acute dermatitis 09/11/2018   Acute maxillary sinusitis 09/11/2018   Acute urinary tract infection 09/11/2018   Anemia    Herpes simplex of female genitalia 09/11/2018   Hyperlipidemia    patient denies   Hypertension    Mass    on rt inner thigh   Trichomonal vaginitis 09/11/2018    Patient Active Problem List   Diagnosis Date Noted   Situational depression 02/15/2019   Chronic pain of right knee 02/15/2019   Pneumonia of both lower lobes due to infectious organism 10/12/2018   Closed nondisplaced fracture of lesser toe of right foot with routine healing 10/12/2018   Other constipation 09/18/2018   Right upper quadrant abdominal pain 00/17/4944   Lichenification 96/75/9163   Essential hypertension 08/21/2018   Class 2 obesity due to excess calories without serious comorbidity with body mass index (BMI) of 37.0 to 37.9  in adult 08/21/2018   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   Vertigo 05/19/2018   S/P myomectomy 07/20/2015    Class: Status post    Past Surgical History:  Procedure Laterality Date   BREAST EXCISIONAL BIOPSY Left 2003   papiloma removed   BREAST LUMPECTOMY WITH RADIOACTIVE SEED LOCALIZATION Right 09/08/2020   Procedure: RIGHT BREAST LUMPECTOMY  X 3 WITH RADIOACTIVE SEED LOCALIZATION;  Surgeon: Coralie Keens, MD;  Location: [place];  Service: General;  Laterality: Right;  LMA   BREAST SURGERY     left breast papilloma   CESAREAN SECTION  07/11/03   LAPAROSCOPIC CHOLECYSTECTOMY SINGLE SITE WITH INTRAOPERATIVE CHOLANGIOGRAM N/A 09/11/2018   Procedure: LAPAROSCOPIC CHOLECYSTECTOMY SINGLE SITE WITH INTRAOPERATIVE CHOLANGIOGRAM;  Surgeon: Michael Boston, MD;  Location: [facility];  Service: General;  Laterality: N/A;   MASS EXCISION  05/21/05   inner lt thigh   MYOMECTOMY N/A 07/20/2015   Procedure: MYOMECTOMY, ABDOMINAL;  Surgeon: Arvella Nigh, MD;  Location: [facility];  Service: Gynecology;  Laterality: N/A;   WISDOM TOOTH EXTRACTION      OB History   No obstetric history on file.      Home Medications    Prior to Admission medications   Medication Sig Start Date End Date Taking? Authorizing Provider  albuterol (VENTOLIN HFA) 108 (90 Base) MCG/ACT inhaler Inhale 1-2 puffs into the lungs every 6 (six) hours as needed for wheezing or shortness of breath. 02/12/21  Yes  Heleena Miceli C, PA-C  benzonatate (TESSALON) 200 MG capsule Take 1 capsule (200 mg total) by mouth 3 (three) times daily as needed for up to 7 days for cough. 02/12/21 02/19/21 Yes Selah Zelman C, PA-C  cephALEXin (KEFLEX) 500 MG capsule Take 1 capsule (500 mg total) by mouth 4 (four) times daily for 5 days. 02/12/21 02/17/21 Yes Sherah Lund C, PA-C  dextromethorphan-guaiFENesin (MUCINEX DM) 30-600 MG 12hr tablet Take 1 tablet by mouth 2 (two) times daily. 02/12/21  Yes Sullivan Blasing C, PA-C   triamcinolone cream (KENALOG) 0.1 % Apply 1 application topically 2 (two) times daily. 02/12/21  Yes Katrinna Travieso C, PA-C  lubiprostone (AMITIZA) 8 MCG capsule Take 1 capsule (8 mcg total) by mouth daily with breakfast. 10/18/20   [name], FNP  NUVARING 0.12-0.015 MG/24HR vaginal ring Place 1 application vaginally every 30 (thirty) days. 03/23/18   [provider]  olmesartan-hydrochlorothiazide (BENICAR HCT) 40-25 MG tablet Take 1 tablet by mouth daily. 12/19/20   [name], FNP  phentermine (ADIPEX-P) 37.5 MG tablet Take 1 tablet (37.5 mg total) by mouth daily before breakfast. 12/19/20   [name], FNP    Family History Family History  Problem Relation Age of Onset   COPD Mother    Healthy Father     Social History Social History   Tobacco Use   Smoking status: Never   Smokeless tobacco: Never  Vaping Use   Vaping Use: Never used  Substance Use Topics   Alcohol use: No   Drug use: No     Allergies   Hydrocodone-acetaminophen   Review of Systems Review of Systems  Constitutional:  Negative for fatigue and fever.  HENT:  Negative for mouth sores.   Eyes:  Negative for visual disturbance.  Respiratory:  Positive for cough. Negative for shortness of breath.   Cardiovascular:  Negative for chest pain.  Gastrointestinal:  Negative for abdominal pain, nausea and vomiting.  Genitourinary:  Negative for genital sores.  Musculoskeletal:  Negative for arthralgias and joint swelling.  Skin:  Positive for color change. Negative for rash and wound.  Neurological:  Negative for dizziness, weakness, light-headedness and headaches.    Physical Exam Triage Vital Signs ED Triage Vitals  Enc Vitals Group     BP      Pulse      Resp      Temp      Temp src      SpO2      Weight      Height      Head Circumference      Peak Flow      Pain Score      Pain Loc      Pain Edu?      Excl. in [place]?    No data found.  Updated Vital Signs BP 104/68 (BP  Location: Right Arm)   Pulse 89   Temp 98.2 F (36.8 C) (Oral)   Resp 18   LMP 02/12/2021   SpO2 96%   Visual Acuity Right Eye Distance:   Left Eye Distance:   Bilateral Distance:    Right Eye Near:   Left Eye Near:    Bilateral Near:     Physical Exam Vitals and nursing note reviewed.  Constitutional:      Appearance: She is well-developed.     Comments: No acute distress  HENT:     Head: Normocephalic and atraumatic.     Nose: Nose normal.  Eyes:  Conjunctiva/sclera: Conjunctivae normal.  Cardiovascular:     Rate and Rhythm: Normal rate and regular rhythm.  Pulmonary:     Effort: Pulmonary effort is normal. No respiratory distress.     Comments: Breathing comfortably at rest, CTABL, no wheezing, rales or other adventitious sounds auscultated  Abdominal:     General: There is no distension.  Musculoskeletal:        General: Normal range of motion.     Cervical back: Neck supple.  Skin:    General: Skin is warm and dry.     Comments: Right inner knee with redness swelling and splotchiness, 1 area with small hyperpigmented area indicating possible retained stinger, minimal induration, mild warmth  Neurological:     Mental Status: She is alert and oriented to person, place, and time.     UC Treatments / Results  Labs (all labs ordered are listed, but only abnormal results are displayed) Labs Reviewed - No data to display  EKG   Radiology No results found.  Procedures Procedures (including critical care time)  Medications Ordered in UC Medications - No data to display  Initial Impression / Assessment and Plan / UC Course  I have reviewed the triage vital signs and the nursing notes.  Pertinent labs & imaging results that were available during my care of the patient were reviewed by me and considered in my medical decision making (see chart for details).    Insect bite/sting to right knee-lower suspicion of infection at this time, but we will go and  cover with Keflex given patient's reported pain with this, increasing to triamcinolone to apply topically continue ice antihistamines, encouraged Zyrtec/Claritin in the morning, Benadryl use in the evening, monitor for continued gradual resolution.  Expect possible retained stinger to dislodge spontaneously/with skin sloughing. Cough/wheezing-refilled albuterol inhaler, deferring oral steroids per patient request, and recommended Mucinex and Tessalon for cough and congestion in the interim.  Discussed strict return precautions. Patient verbalized understanding and is agreeable with plan.   Final Clinical Impressions(s) / UC Diagnoses   Final diagnoses:  Insect bite of right lower extremity, initial encounter  Cough  Wheezing     Discharge Instructions      Please continue cool compresses and elevating leg Use triamcinolone twice daily to area Keflex 4 times daily x5 days to help with any underlying infection Daily cetirizine/Zyrtec or loratadine/Claritin during the day, Benadryl in the evening  Please use Mucinex twice daily Tessalon every 8 hours for cough Albuterol inhaler as needed for shortness of breath chest tightness, wheezing  Please follow-up if any symptoms not improving or worsening     ED Prescriptions     Medication Sig Dispense Auth. Provider   triamcinolone cream (KENALOG) 0.1 % Apply 1 application topically 2 (two) times daily. 45 g Makalyn Lennox C, PA-C   cephALEXin (KEFLEX) 500 MG capsule Take 1 capsule (500 mg total) by mouth 4 (four) times daily for 5 days. 20 capsule Noor Witte C, PA-C   benzonatate (TESSALON) 200 MG capsule Take 1 capsule (200 mg total) by mouth 3 (three) times daily as needed for up to 7 days for cough. 28 capsule Johnnay Pleitez C, PA-C   dextromethorphan-guaiFENesin (MUCINEX DM) 30-600 MG 12hr tablet Take 1 tablet by mouth 2 (two) times daily. 20 tablet Lousie Calico C, PA-C   albuterol (VENTOLIN HFA) 108 (90 Base) MCG/ACT  inhaler Inhale 1-2 puffs into the lungs every 6 (six) hours as needed for wheezing or shortness of breath. 1 each [employer],  Chera Slivka C, PA-C      PDMP not reviewed this encounter.   Janith Lima, [place] 02/12/21 1307

## 2021-02-23 ENCOUNTER — Other Ambulatory Visit (HOSPITAL_COMMUNITY): Payer: BC Managed Care – PPO

## 2021-02-26 ENCOUNTER — Encounter (HOSPITAL_BASED_OUTPATIENT_CLINIC_OR_DEPARTMENT_OTHER): Payer: Self-pay

## 2021-02-26 ENCOUNTER — Ambulatory Visit (HOSPITAL_BASED_OUTPATIENT_CLINIC_OR_DEPARTMENT_OTHER): Admit: 2021-02-26 | Payer: BC Managed Care – PPO | Admitting: Obstetrics and Gynecology

## 2021-02-26 SURGERY — HYSTERECTOMY, TOTAL, ABDOMINAL, WITH SALPINGECTOMY
Anesthesia: General | Laterality: Bilateral

## 2021-04-02 ENCOUNTER — Encounter: Payer: Self-pay | Admitting: Nurse Practitioner

## 2021-04-02 ENCOUNTER — Telehealth (INDEPENDENT_AMBULATORY_CARE_PROVIDER_SITE_OTHER): Payer: BC Managed Care – PPO | Admitting: Nurse Practitioner

## 2021-04-02 VITALS — BP 121/83 | HR 99 | Temp 98.3°F

## 2021-04-02 DIAGNOSIS — U071 COVID-19: Secondary | ICD-10-CM

## 2021-04-02 DIAGNOSIS — R059 Cough, unspecified: Secondary | ICD-10-CM | POA: Diagnosis not present

## 2021-04-02 MED ORDER — PROMETHAZINE-DM 6.25-15 MG/5ML PO SYRP: 5.0000 mL | ORAL_SOLUTION | Freq: Four times a day (QID) | ORAL | 0 refills | Status: DC | PRN

## 2021-04-02 MED ORDER — NIRMATRELVIR/RITONAVIR (PAXLOVID)TABLET: 3.0000 | ORAL_TABLET | Freq: Two times a day (BID) | ORAL | 0 refills | Status: AC

## 2021-04-02 NOTE — Patient Instructions (Signed)
10 Things You Can Do to Manage Your COVID-19 Symptoms at Home If you have possible or confirmed COVID-19 Stay home except to get medical care. Monitor your symptoms carefully. If your symptoms get worse, call your healthcare provider immediately. Get rest and stay hydrated. If you have a medical appointment, call the healthcare provider ahead of time and tell them that you have or may have COVID-19. For medical emergencies, call 911 and notify the dispatch personnel that you have or may have COVID-19. Cover your cough and sneezes with a tissue or use the inside of your elbow. Wash your hands often with soap and water for at least 20 seconds or clean your hands with an alcohol-based hand sanitizer that contains at least 60% alcohol. As much as possible, stay in a specific room and away from other people in your home. Also, you should use a separate bathroom, if available. If you need to be around other people in or outside of the home, wear a mask. Avoid sharing personal items with other people in your household, like dishes, towels, and bedding. Clean all surfaces that are touched often, like counters, tabletops, and doorknobs. Use household cleaning sprays or wipes according to the label instructions. cdc.gov/coronavirus 02/18/2020 This information is not intended to replace advice given to you by your health care provider. Make sure you discuss any questions you have with your health care provider. Document Revised: 12/07/2020 Document Reviewed: 12/07/2020 Elsevier Patient Education  2022 Elsevier Inc.  

## 2021-04-02 NOTE — Progress Notes (Signed)
Virtual Visit via MyChart had to transfer to phone due to problems with MyChart   This visit type was conducted due to national recommendations for restrictions regarding the COVID-19 Pandemic (e.g. social distancing) in an effort to limit this patient's exposure and mitigate transmission in our community.  Due to her co-morbid illnesses, this patient is at least at moderate risk for complications without adequate follow up.  This format is felt to be most appropriate for this patient at this time.  All issues noted in this document were discussed and addressed.  A limited physical exam was performed with this format.    This visit type was conducted due to national recommendations for restrictions regarding the COVID-19 Pandemic (e.g. social distancing) in an effort to limit this patient's exposure and mitigate transmission in our community.  Patients identity confirmed using two different identifiers.  This format is felt to be most appropriate for this patient at this time.  All issues noted in this document were discussed and addressed.  No physical exam was performed (except for noted visual exam findings with Video Visits).    Date:  04/02/2021   ID:  [name], DOB 06-Jul-1976, MRN [id number]  Patient Location:  Home - spoke with [name]  Provider location:   Office    Chief Complaint:  Positive covid, coughs   History of Present Illness:    [name] is a 45 y.o. female who presents via video conferencing for a telehealth visit today.    The patient does have symptoms concerning for COVID-19 infection (fever, chills, cough, or new shortness of breath).   Patient tested positive Covid on Saturday 03/31/2021.  Her symptoms started on Thursday 03/29/2021 had dry cough, woke up at 2 am and had coughing. She went in to work during the day continued to cough and then at the end of the day she was fatigued. Headache, chills, fever, sore throat, increased mucous.  Denies  shortness of breath. She does have artificial nails - SpO2 was 90%, over the counter mucinex, vitamin d, gnger tea. She is taking tessalon perles and tylenol for fever and pain. She has had covid in the past, she has not had IV infusion therapy.     Past Medical History:  Diagnosis Date   Acute bronchitis 09/11/2018   Acute calculous cholecystitis 09/11/2018   Acute dermatitis 09/11/2018   Acute maxillary sinusitis 09/11/2018   Acute urinary tract infection 09/11/2018   Anemia    Herpes simplex of female genitalia 09/11/2018   Hyperlipidemia    patient denies   Hypertension    Mass    on rt inner thigh   Trichomonal vaginitis 09/11/2018   Past Surgical History:  Procedure Laterality Date   BREAST EXCISIONAL BIOPSY Left 2003   papiloma removed   BREAST LUMPECTOMY WITH RADIOACTIVE SEED LOCALIZATION Right 09/08/2020   Procedure: RIGHT BREAST LUMPECTOMY  X 3 WITH RADIOACTIVE SEED LOCALIZATION;  Surgeon: Coralie Keens, MD;  Location: [place];  Service: General;  Laterality: Right;  LMA   BREAST SURGERY     left breast papilloma   CESAREAN SECTION  07/11/03   LAPAROSCOPIC CHOLECYSTECTOMY SINGLE SITE WITH INTRAOPERATIVE CHOLANGIOGRAM N/A 09/11/2018   Procedure: LAPAROSCOPIC CHOLECYSTECTOMY SINGLE SITE WITH INTRAOPERATIVE CHOLANGIOGRAM;  Surgeon: Michael Boston, MD;  Location: [facility];  Service: General;  Laterality: N/A;   MASS EXCISION  05/21/05   inner lt thigh   MYOMECTOMY N/A 07/20/2015   Procedure: MYOMECTOMY, ABDOMINAL;  Surgeon: Arvella Nigh,  MD;  Location: [facility];  Service: Gynecology;  Laterality: N/A;   WISDOM TOOTH EXTRACTION       Current Meds  Medication Sig   nirmatrelvir/ritonavir EUA (PAXLOVID) 20 x 150 MG & 10 x '100MG'$  TABS Take 3 tablets by mouth 2 (two) times daily for 5 days. Patient GFR is >60. Take nirmatrelvir (150 mg) two tablets twice daily for 5 days and ritonavir (100 mg) one tablet twice daily for 5 days.   promethazine-dextromethorphan (PROMETHAZINE-DM)  6.25-15 MG/5ML syrup Take 5 mLs by mouth 4 (four) times daily as needed for cough.     Allergies:   Hydrocodone-acetaminophen   Social History   Tobacco Use   Smoking status: Never   Smokeless tobacco: Never  Vaping Use   Vaping Use: Never used  Substance Use Topics   Alcohol use: No   Drug use: No     Family Hx: The patient's family history includes COPD in her mother; Healthy in her father.  ROS:   Please see the history of present illness.    Review of Systems  Constitutional:  Positive for chills. Negative for fever.       She had chills  Respiratory:  Positive for cough.   Cardiovascular: Negative.   Musculoskeletal:  Negative for myalgias.  Neurological:  Positive for headaches. Negative for dizziness.  Psychiatric/Behavioral: Negative.     All other systems reviewed and are negative.   Labs/Other Tests and Data Reviewed:    Recent Labs: 09/05/2020: BUN 12; Creatinine, Ser 0.79; Potassium 3.4; Sodium 138   Recent Lipid Panel Lab Results  Component Value Date/Time   CHOL 197 10/18/2020 11:29 AM   TRIG 35 10/18/2020 11:29 AM   HDL 65 10/18/2020 11:29 AM   CHOLHDL 3.0 10/18/2020 11:29 AM   LDLCALC 125 (H) 10/18/2020 11:29 AM    Wt Readings from Last 3 Encounters:  12/19/20 209 lb 9.6 oz (95.1 kg)  10/18/20 209 lb 6.4 oz (95 kg)  09/08/20 215 lb 9.8 oz (97.8 kg)     Exam:    Vital Signs:  BP 121/83   Pulse 99   Temp 98.3 F (36.8 C) (Temporal)     Physical Exam Constitutional:      General: She is not in acute distress.    Appearance: Normal appearance. She is obese.  Pulmonary:     Effort: Pulmonary effort is normal. No respiratory distress.     Breath sounds: No wheezing.  Neurological:     General: No focal deficit present.     Mental Status: She is alert and oriented to person, place, and time.     Cranial Nerves: No cranial nerve deficit.  Psychiatric:        Mood and Affect: Mood and affect normal.        Behavior: Behavior normal.         Thought Content: Thought content normal.        Cognition and Memory: Memory normal.        Judgment: Judgment normal.    ASSESSMENT & PLAN:    1. COVID-19 virus infection Offered to refer for MAB infusion she declined to have this infusion she would like to take Paxlovid instead, discussed instructions.  Continue with regular vitamins She reports she does not need a work note., she will be able to come off isolation on Aug 31st.  Advised patient to take Vitamin C, D, Zinc.  Keep yourself hydrated with a lot of water and rest. Take Delsym for  cough and Mucinex as need. Take Tylenol or pain reliever every 4-6 hours as needed for pain/fever/body ache. If you have elevated blood pressure, you can take OTC Corcidin. You can also take OTC oscillococcinum to help with your symptoms.  Educated patient if symptoms get worse or if she experiences any SOB, chest pain or pain in her legs to seek immediate emergency care. Continue to monitor your oxygen levels. Call us if you have any questions. Quarantine for 5 days if tested positive and no symptoms or 10 days if tested positive and have symptoms. Wear a mask around other people.   - nirmatrelvir/ritonavir EUA (PAXLOVID) 20 x 150 MG & 10 x '100MG'$  TABS; Take 3 tablets by mouth 2 (two) times daily for 5 days. Patient GFR is >60. Take nirmatrelvir (150 mg) two tablets twice daily for 5 days and ritonavir (100 mg) one tablet twice daily for 5 days.  Dispense: 30 tablet; Refill: 0  2. Cough Tessolon perles are ineffective. She has a sensitivity to codeine so will send Rx for promethazine - promethazine-dextromethorphan (PROMETHAZINE-DM) 6.25-15 MG/5ML syrup; Take 5 mLs by mouth 4 (four) times daily as needed for cough.  Dispense: 180 mL; Refill: 0   COVID-19 Education: The signs and symptoms of COVID-19 were discussed with the patient and how to seek care for testing (follow up with PCP or arrange E-visit).  The importance of social distancing was  discussed today.  Patient Risk:   After full review of this patients clinical status, I feel that they are at least moderate risk at this time.  Time:   Today, I have spent 12 minutes/ seconds with the patient with telehealth technology discussing above diagnoses.     Medication Adjustments/Labs and Tests Ordered: Current medicines are reviewed at length with the patient today.  Concerns regarding medicines are outlined above.   Tests Ordered: No orders of the defined types were placed in this encounter.   Medication Changes: Meds ordered this encounter  Medications   nirmatrelvir/ritonavir EUA (PAXLOVID) 20 x 150 MG & 10 x '100MG'$  TABS    Sig: Take 3 tablets by mouth 2 (two) times daily for 5 days. Patient GFR is >60. Take nirmatrelvir (150 mg) two tablets twice daily for 5 days and ritonavir (100 mg) one tablet twice daily for 5 days.    Dispense:  30 tablet    Refill:  0   promethazine-dextromethorphan (PROMETHAZINE-DM) 6.25-15 MG/5ML syrup    Sig: Take 5 mLs by mouth 4 (four) times daily as needed for cough.    Dispense:  180 mL    Refill:  0     Disposition:  Follow up prn  Signed, [name], FNP

## 2021-04-03 ENCOUNTER — Encounter: Payer: BC Managed Care – PPO | Admitting: Nurse Practitioner

## 2021-04-06 LAB — HM PAP SMEAR: HM Pap smear: NORMAL

## 2021-04-09 ENCOUNTER — Other Ambulatory Visit: Payer: Self-pay | Admitting: Nurse Practitioner

## 2021-04-09 DIAGNOSIS — E669 Obesity, unspecified: Secondary | ICD-10-CM

## 2021-05-17 ENCOUNTER — Ambulatory Visit (INDEPENDENT_AMBULATORY_CARE_PROVIDER_SITE_OTHER): Payer: BC Managed Care – PPO | Admitting: Nurse Practitioner

## 2021-05-17 ENCOUNTER — Encounter: Payer: Self-pay | Admitting: Nurse Practitioner

## 2021-05-17 ENCOUNTER — Other Ambulatory Visit: Payer: Self-pay

## 2021-05-17 VITALS — BP 120/84 | HR 84 | Temp 98.8°F | Ht 63.0 in | Wt 229.7 lb

## 2021-05-17 DIAGNOSIS — I10 Essential (primary) hypertension: Secondary | ICD-10-CM

## 2021-05-17 DIAGNOSIS — R7309 Other abnormal glucose: Secondary | ICD-10-CM

## 2021-05-17 DIAGNOSIS — Z0000 Encounter for general adult medical examination without abnormal findings: Secondary | ICD-10-CM

## 2021-05-17 DIAGNOSIS — E785 Hyperlipidemia, unspecified: Secondary | ICD-10-CM

## 2021-05-17 DIAGNOSIS — Z2821 Immunization not carried out because of patient refusal: Secondary | ICD-10-CM

## 2021-05-17 DIAGNOSIS — E559 Vitamin D deficiency, unspecified: Secondary | ICD-10-CM

## 2021-05-17 DIAGNOSIS — E662 Morbid (severe) obesity with alveolar hypoventilation: Secondary | ICD-10-CM

## 2021-05-17 DIAGNOSIS — N920 Excessive and frequent menstruation with regular cycle: Secondary | ICD-10-CM

## 2021-05-17 DIAGNOSIS — R82998 Other abnormal findings in urine: Secondary | ICD-10-CM

## 2021-05-17 DIAGNOSIS — Z6841 Body Mass Index (BMI) 40.0 and over, adult: Secondary | ICD-10-CM

## 2021-05-17 LAB — POCT URINALYSIS DIPSTICK
Bilirubin, UA: NEGATIVE
Blood, UA: NEGATIVE
Glucose, UA: NEGATIVE
Ketones, UA: NEGATIVE
Nitrite, UA: NEGATIVE
Protein, UA: NEGATIVE
Spec Grav, UA: 1.025 (ref 1.010–1.025)
Urobilinogen, UA: 1 E.U./dL
pH, UA: 6.5 (ref 5.0–8.0)

## 2021-05-17 LAB — POCT UA - MICROALBUMIN
Albumin/Creatinine Ratio, Urine, POC: 30
Creatinine, POC: 300 mg/dL
Microalbumin Ur, POC: 30 mg/L

## 2021-05-17 MED ORDER — SAXENDA 18 MG/3ML ~~LOC~~ SOPN: 3.0000 mg | PEN_INJECTOR | Freq: Every day | SUBCUTANEOUS | 1 refills | Status: DC

## 2021-05-17 NOTE — Progress Notes (Signed)
I,Yamilika J Llittleton,acting as a Education administrator for [employer], FNP.,have documented all relevant documentation on the behalf of [name], FNP,as directed by  [name], FNP while in the presence of [name], [place].   This visit occurred during the SARS-CoV-2 public health emergency.  Safety protocols were in place, including screening questions prior to the visit, additional usage of staff PPE, and extensive cleaning of exam room while observing appropriate contact time as indicated for disinfecting solutions.  Subjective:     Patient ID: [name] , female    DOB: July 08, 1976 , 45 y.o.   MRN: [id number]   Chief Complaint  Patient presents with   Annual Exam    HPI  Patient presents today for hm. She is followed by [facility] for her GYN care. Her GYN is recommending for her to have a hysterectomy she just has not scheduled.   Wt Readings from Last 3 Encounters: 05/17/21 : 229 lb 11 oz (104.2 kg) 12/19/20 : 209 lb 9.6 oz (95.1 kg) 10/18/20 : 209 lb 6.4 oz (95 kg)      Past Medical History:  Diagnosis Date   Acute bronchitis 09/11/2018   Acute calculous cholecystitis 09/11/2018   Acute dermatitis 09/11/2018   Acute maxillary sinusitis 09/11/2018   Acute urinary tract infection 09/11/2018   Anemia    Herpes simplex of female genitalia 09/11/2018   Hyperlipidemia    patient denies   Hypertension    Mass    on rt inner thigh   Trichomonal vaginitis 09/11/2018     Family History  Problem Relation Age of Onset   COPD Mother    Healthy Father      Current Outpatient Medications:    Liraglutide -Weight Management (SAXENDA) 18 MG/3ML SOPN, Inject 3 mg into the skin daily., Disp: 9 mL, Rfl: 1   NUVARING 0.12-0.015 MG/24HR vaginal ring, Place 1 application vaginally every 30 (thirty) days., Disp: , Rfl: 11   olmesartan-hydrochlorothiazide (BENICAR HCT) 40-25 MG tablet, Take 1 tablet by mouth daily., Disp: 90 tablet, Rfl: 1   phentermine (ADIPEX-P) 37.5 MG  tablet, Take 1 tablet (37.5 mg total) by mouth daily before breakfast. (Patient not taking: Reported on 05/17/2021), Disp: 30 tablet, Rfl: 1   Allergies  Allergen Reactions   Hydrocodone-Acetaminophen Nausea And Vomiting      The patient states she is using the NuvaRing vaginal inserts for birth control.  Patient's last menstrual period was 05/06/2021.. Negative for Dysmenorrhea and Negative for Menorrhagia. Negative for: breast discharge, breast lump(s), breast pain and breast self exam. Associated symptoms include abnormal vaginal bleeding. Pertinent negatives include abnormal bleeding (hematology), anxiety, decreased libido, depression, difficulty falling sleep, dyspareunia, history of infertility, nocturia, sexual dysfunction, sleep disturbances, urinary incontinence, urinary urgency, vaginal discharge and vaginal itching. Diet regular.The patient states her exercise level is minimal - will go to the gym but not regularly. She is also under increased stress. She will try to wake up in the morning to exercise but is not consistent.   The patient's tobacco use is:  Social History   Tobacco Use  Smoking Status Never  Smokeless Tobacco Never   She has been exposed to passive smoke. The patient's alcohol use is:  Social History   Substance and Sexual Activity  Alcohol Use No   Additional information: Last pap reported to have done in September 2022.    Review of Systems  Constitutional: Negative.   HENT: Negative.    Eyes: Negative.   Respiratory:  Negative.    Cardiovascular: Negative.   Gastrointestinal: Negative.   Endocrine: Negative.   Genitourinary: Negative.   Musculoskeletal: Negative.   Skin: Negative.   Allergic/Immunologic: Negative.   Neurological: Negative.   Hematological: Negative.   Psychiatric/Behavioral: Negative.      Today's Vitals   05/17/21 0943  BP: 120/84  Pulse: 84  Temp: 98.8 F (37.1 C)  Weight: 229 lb 11 oz (104.2 kg)  Height: 5' 3"  (1.6 m)   PainSc: 0-No pain   Body mass index is 40.69 kg/m.   Objective:  Physical Exam Vitals reviewed.  Constitutional:      General: She is not in acute distress.    Appearance: Normal appearance. She is well-developed. She is obese.  HENT:     Head: Normocephalic and atraumatic.     Right Ear: Hearing, tympanic membrane, ear canal and external ear normal. There is no impacted cerumen.     Left Ear: Hearing, tympanic membrane, ear canal and external ear normal. There is no impacted cerumen.  Eyes:     General: Lids are normal.     Extraocular Movements: Extraocular movements intact.     Conjunctiva/sclera: Conjunctivae normal.     Pupils: Pupils are equal, round, and reactive to light.     Funduscopic exam:    Right eye: No papilledema.        Left eye: No papilledema.  Neck:     Thyroid: No thyroid mass.     Vascular: No carotid bruit.  Cardiovascular:     Rate and Rhythm: Normal rate and regular rhythm.     Pulses: Normal pulses.     Heart sounds: Normal heart sounds. No murmur heard. Pulmonary:     Effort: Pulmonary effort is normal. No respiratory distress.     Breath sounds: Normal breath sounds. No wheezing.  Abdominal:     General: Abdomen is flat. Bowel sounds are normal. There is no distension.     Palpations: Abdomen is soft.     Tenderness: There is no abdominal tenderness.  Genitourinary:    Rectum: Guaiac result negative.  Musculoskeletal:        General: No swelling. Normal range of motion.     Cervical back: Full passive range of motion without pain, normal range of motion and neck supple.     Right lower leg: No edema.     Left lower leg: No edema.  Skin:    General: Skin is warm and dry.     Capillary Refill: Capillary refill takes less than 2 seconds.  Neurological:     General: No focal deficit present.     Mental Status: She is alert and oriented to person, place, and time.     Cranial Nerves: No cranial nerve deficit.     Sensory: No sensory  deficit.  Psychiatric:        Mood and Affect: Mood normal.        Behavior: Behavior normal.        Thought Content: Thought content normal.        Judgment: Judgment normal.        Assessment And Plan:     1. Encounter for general adult medical examination w/o abnormal findings Behavior modifications discussed and diet history reviewed.   Pt will continue to exercise regularly and modify diet with low GI, plant based foods and decrease intake of processed foods.  Recommend intake of daily multivitamin, Vitamin D, and calcium.  Recommend mammogram and colonoscopy for preventive  screenings, as well as recommend immunizations that include influenza, TDAP (up to date) - CBC  2. 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  Dispense: 9 mL; Refill: 1  4. Abnormal glucose Comments: Will start Ozempic, denes family history of thyroid cancer and personal history of pancreatic cancer. Discussed side effects to include nausea and constipation and abdomen pain - Hemoglobin A1c  5. Vitamin D deficiency Will check vitamin D level and supplement as needed.    Also encouraged to spend 15 minutes in the sun daily.  - Vitamin D (25 hydroxy)  6. Elevated lipids Comments: No current medications, encouraged to eat a low fat diet with high fiber - Lipid panel  7. Menorrhagia with regular cycle Comments: Will check iron studies due to increase in menstrual cycles. Continue follow up with GYN - Iron, TIBC and Ferritin Panel  8. Urine white blood cells increased Comments: Will send urine for  culture, no symptoms - Culture, Urine  9. Influenza vaccination declined Patient declined influenza vaccination at this time. Patient is aware that influenza vaccine prevents illness in 70% of healthy people, and reduces hospitalizations to 30-70% in elderly. This vaccine is recommended annually. Pt is willing to accept risk associated with refusing vaccination.    Patient was given opportunity to ask questions. Patient verbalized understanding of the plan and was able to repeat key elements of the plan. All questions were answered to their satisfaction.   [name], FNP   I, [name], FNP, have reviewed all documentation for this visit. The documentation on 05/17/21 for the exam, diagnosis, procedures, and orders are all accurate and complete.   THE PATIENT IS ENCOURAGED TO PRACTICE SOCIAL DISTANCING DUE TO THE COVID-19 PANDEMIC.

## 2021-05-17 NOTE — Patient Instructions (Signed)
Health Maintenance, Female Adopting a healthy lifestyle and getting preventive care are important in promoting health and wellness. Ask your health care provider about: The right schedule for you to have regular tests and exams. Things you can do on your own to prevent diseases and keep yourself healthy. What should I know about diet, weight, and exercise? Eat a healthy diet  Eat a diet that includes plenty of vegetables, fruits, low-fat dairy products, and lean protein. Do not eat a lot of foods that are high in solid fats, added sugars, or sodium. Maintain a healthy weight Body mass index (BMI) is used to identify weight problems. It estimates body fat based on height and weight. Your health care provider can help determine your BMI and help you achieve or maintain a healthy weight. Get regular exercise Get regular exercise. This is one of the most important things you can do for your health. Most adults should: Exercise for at least 150 minutes each week. The exercise should increase your heart rate and make you sweat (moderate-intensity exercise). Do strengthening exercises at least twice a week. This is in addition to the moderate-intensity exercise. Spend less time sitting. Even light physical activity can be beneficial. Watch cholesterol and blood lipids Have your blood tested for lipids and cholesterol at 44 years of age, then have this test every 5 years. Have your cholesterol levels checked more often if: Your lipid or cholesterol levels are high. You are older than 44 years of age. You are at high risk for heart disease. What should I know about cancer screening? Depending on your health history and family history, you may need to have cancer screening at various ages. This may include screening for: Breast cancer. Cervical cancer. Colorectal cancer. Skin cancer. Lung cancer. What should I know about heart disease, diabetes, and high blood pressure? Blood pressure and heart  disease High blood pressure causes heart disease and increases the risk of stroke. This is more likely to develop in people who have high blood pressure readings, are of African descent, or are overweight. Have your blood pressure checked: Every 3-5 years if you are 18-39 years of age. Every year if you are 40 years old or older. Diabetes Have regular diabetes screenings. This checks your fasting blood sugar level. Have the screening done: Once every three years after age 40 if you are at a normal weight and have a low risk for diabetes. More often and at a younger age if you are overweight or have a high risk for diabetes. What should I know about preventing infection? Hepatitis B If you have a higher risk for hepatitis B, you should be screened for this virus. Talk with your health care provider to find out if you are at risk for hepatitis B infection. Hepatitis C Testing is recommended for: Everyone born from 1945 through 1965. Anyone with known risk factors for hepatitis C. Sexually transmitted infections (STIs) Get screened for STIs, including gonorrhea and chlamydia, if: You are sexually active and are younger than 44 years of age. You are older than 44 years of age and your health care provider tells you that you are at risk for this type of infection. Your sexual activity has changed since you were last screened, and you are at increased risk for chlamydia or gonorrhea. Ask your health care provider if you are at risk. Ask your health care provider about whether you are at high risk for HIV. Your health care provider may recommend a prescription medicine   to help prevent HIV infection. If you choose to take medicine to prevent HIV, you should first get tested for HIV. You should then be tested every 3 months for as long as you are taking the medicine. Pregnancy If you are about to stop having your period (premenopausal) and you may become pregnant, seek counseling before you get  pregnant. Take 400 to 800 micrograms (mcg) of folic acid every day if you become pregnant. Ask for birth control (contraception) if you want to prevent pregnancy. Osteoporosis and menopause Osteoporosis is a disease in which the bones lose minerals and strength with aging. This can result in bone fractures. If you are 65 years old or older, or if you are at risk for osteoporosis and fractures, ask your health care provider if you should: Be screened for bone loss. Take a calcium or vitamin D supplement to lower your risk of fractures. Be given hormone replacement therapy (HRT) to treat symptoms of menopause. Follow these instructions at home: Lifestyle Do not use any products that contain nicotine or tobacco, such as cigarettes, e-cigarettes, and chewing tobacco. If you need help quitting, ask your health care provider. Do not use street drugs. Do not share needles. Ask your health care provider for help if you need support or information about quitting drugs. Alcohol use Do not drink alcohol if: Your health care provider tells you not to drink. You are pregnant, may be pregnant, or are planning to become pregnant. If you drink alcohol: Limit how much you use to 0-1 drink a day. Limit intake if you are breastfeeding. Be aware of how much alcohol is in your drink. In the [place] one drink equals one 12 oz bottle of beer (355 mL), one 5 oz glass of wine (148 mL), or one 1 oz glass of hard liquor (44 mL). General instructions Schedule regular health, dental, and eye exams. Stay current with your vaccines. Tell your health care provider if: You often feel depressed. You have ever been abused or do not feel safe at home. Summary Adopting a healthy lifestyle and getting preventive care are important in promoting health and wellness. Follow your health care provider's instructions about healthy diet, exercising, and getting tested or screened for diseases. Follow your health care provider's  instructions on monitoring your cholesterol and blood pressure. This information is not intended to replace advice given to you by your health care provider. Make sure you discuss any questions you have with your health care provider. Document Revised: 09/29/2020 Document Reviewed: 07/15/2018 Elsevier Patient Education  2022 [employer] Inc[employer]  

## 2021-05-18 LAB — CMP14+EGFR
ALT: 13 IU/L (ref 0–32)
AST: 12 IU/L (ref 0–40)
Albumin/Globulin Ratio: 1.4 (ref 1.2–2.2)
Albumin: 4 g/dL (ref 3.8–4.8)
Alkaline Phosphatase: 79 IU/L (ref 44–121)
BUN/Creatinine Ratio: 14 (ref 9–23)
BUN: 9 mg/dL (ref 6–24)
Bilirubin Total: <0.2 mg/dL (ref 0.0–1.2)
CO2: 20 mmol/L (ref 20–29)
Calcium: 9.2 mg/dL (ref 8.7–10.2)
Chloride: 104 mmol/L (ref 96–106)
Creatinine, Ser: 0.66 mg/dL (ref 0.57–1.00)
Globulin, Total: 2.9 g/dL (ref 1.5–4.5)
Glucose: 91 mg/dL (ref 70–99)
Potassium: 3.9 mmol/L (ref 3.5–5.2)
Sodium: 139 mmol/L (ref 134–144)
Total Protein: 6.9 g/dL (ref 6.0–8.5)
eGFR: 111 mL/min/{1.73_m2} (ref >59)

## 2021-05-18 LAB — HEMOGLOBIN A1C
Est. average glucose Bld gHb Est-mCnc: 128 mg/dL
Hgb A1c MFr Bld: 6.1 % — ABNORMAL HIGH (ref 4.8–5.6)

## 2021-05-18 LAB — LIPID PANEL
Chol/HDL Ratio: 2.9 ratio (ref 0.0–4.4)
Cholesterol, Total: 195 mg/dL (ref 100–199)
HDL: 68 mg/dL (ref >39)
LDL Chol Calc (NIH): 119 mg/dL — ABNORMAL HIGH (ref 0–99)
Triglycerides: 41 mg/dL (ref 0–149)
VLDL Cholesterol Cal: 8 mg/dL (ref 5–40)

## 2021-05-18 LAB — CBC
Hematocrit: 32.5 % — ABNORMAL LOW (ref 34.0–46.6)
Hemoglobin: 11.1 g/dL (ref 11.1–15.9)
MCH: 27.2 pg (ref 26.6–33.0)
MCHC: 34.2 g/dL (ref 31.5–35.7)
MCV: 80 fL (ref 79–97)
Platelets: 311 10*3/uL (ref 150–450)
RBC: 4.08 x10E6/uL (ref 3.77–5.28)
RDW: 13.7 % (ref 11.7–15.4)
WBC: 6.1 10*3/uL (ref 3.4–10.8)

## 2021-05-18 LAB — IRON,TIBC AND FERRITIN PANEL
Ferritin: 14 ng/mL — ABNORMAL LOW (ref 15–150)
Iron Saturation: 11 % — ABNORMAL LOW (ref 15–55)
Iron: 43 ug/dL (ref 27–159)
Total Iron Binding Capacity: 405 ug/dL (ref 250–450)
UIBC: 362 ug/dL (ref 131–425)

## 2021-05-18 LAB — INSULIN, RANDOM: INSULIN: 23.4 u[IU]/mL (ref 2.6–24.9)

## 2021-05-18 LAB — VITAMIN D 25 HYDROXY (VIT D DEFICIENCY, FRACTURES): Vit D, 25-Hydroxy: 33.1 ng/mL (ref 30.0–100.0)

## 2021-05-19 LAB — URINE CULTURE

## 2021-06-04 ENCOUNTER — Encounter: Payer: Self-pay | Admitting: Nurse Practitioner

## 2021-06-29 ENCOUNTER — Encounter: Payer: Self-pay | Admitting: Nurse Practitioner

## 2021-07-17 ENCOUNTER — Ambulatory Visit: Payer: BC Managed Care – PPO | Admitting: Nurse Practitioner

## 2021-07-24 ENCOUNTER — Other Ambulatory Visit: Payer: Self-pay | Admitting: Nurse Practitioner

## 2021-07-24 ENCOUNTER — Encounter: Payer: Self-pay | Admitting: Nurse Practitioner

## 2021-07-24 ENCOUNTER — Ambulatory Visit (INDEPENDENT_AMBULATORY_CARE_PROVIDER_SITE_OTHER): Payer: BC Managed Care – PPO | Admitting: Nurse Practitioner

## 2021-07-24 ENCOUNTER — Other Ambulatory Visit: Payer: Self-pay

## 2021-07-24 VITALS — BP 128/70 | HR 105 | Temp 98.5°F | Ht 64.2 in | Wt 237.2 lb

## 2021-07-24 DIAGNOSIS — Z6841 Body Mass Index (BMI) 40.0 and over, adult: Secondary | ICD-10-CM | POA: Diagnosis not present

## 2021-07-24 DIAGNOSIS — R7309 Other abnormal glucose: Secondary | ICD-10-CM | POA: Diagnosis not present

## 2021-07-24 DIAGNOSIS — E662 Morbid (severe) obesity with alveolar hypoventilation: Secondary | ICD-10-CM

## 2021-07-24 DIAGNOSIS — M79671 Pain in right foot: Secondary | ICD-10-CM | POA: Diagnosis not present

## 2021-07-24 NOTE — Patient Instructions (Signed)
Obesity, Adult Obesity is having too much body fat. Being obese means that your weight is more than what is healthy for you.  BMI (body mass index) is a number that explains how much body fat you have. If you have a BMI of 30 or more, you are obese. Obesity can cause serious health problems, such as: Stroke. Coronary artery disease (CAD). Type 2 diabetes. Some types of cancer. High blood pressure (hypertension). High cholesterol. Gallbladder stones. Obesity can also contribute to: Osteoarthritis. Sleep apnea. Infertility problems. What are the causes? Eating meals each day that are high in calories, sugar, and fat. Drinking a lot of drinks that have sugar in them. Being born with genes that may make you more likely to become obese. Having a medical condition that causes obesity. Taking certain medicines. Sitting a lot (having a sedentary lifestyle). Not getting enough sleep. What increases the risk? Having a family history of obesity. Living in an area with limited access to: [place], recreation centers, or sidewalks. Healthy food choices, such as grocery stores and farmers' markets. What are the signs or symptoms? The main sign is having too much body fat. How is this treated? Treatment for this condition often includes changing your lifestyle. Treatment may include: Changing your diet. This may include making a healthy meal plan. Exercise. This may include activity that causes your heart to beat faster (aerobic exercise) and strength training. Work with your doctor to design a program that works for you. Medicine to help you lose weight. This may be used if you are not able to lose one pound a week after 6 weeks of healthy eating and more exercise. Treating conditions that cause the obesity. Surgery. Options may include gastric banding and gastric bypass. This may be done if: Other treatments have not helped to improve your condition. You have a BMI of 40 or higher. You have  life-threatening health problems related to obesity. Follow these instructions at home: Eating and drinking  Follow advice from your doctor about what to eat and drink. Your doctor may tell you to: Limit fast food, sweets, and processed snack foods. Choose low-fat options. For example, choose low-fat milk instead of whole milk. Eat five or more servings of fruits or vegetables each day. Eat at home more often. This gives you more control over what you eat. Choose healthy foods when you eat out. Learn to read food labels. This will help you learn how much food is in one serving. Keep low-fat snacks available. Avoid drinks that have a lot of sugar in them. These include soda, fruit juice, iced tea with sugar, and flavored milk. Drink enough water to keep your pee (urine) pale yellow. Do not go on fad diets. Physical activity Exercise often, as told by your doctor. Most adults should get up to 150 minutes of moderate-intensity exercise every week.Ask your doctor: What types of exercise are safe for you. How often you should exercise. Warm up and stretch before being active. Do slow stretching after being active (cool down). Rest between times of being active. Lifestyle Work with your doctor and a food expert (dietitian) to set a weight-loss goal that is best for you. Limit your screen time. Find ways to reward yourself that do not involve food. Do not drink alcohol if: Your doctor tells you not to drink. You are pregnant, may be pregnant, or are planning to become pregnant. If you drink alcohol: Limit how much you have to: 0-1 drink a day for women. 0-2 drinks  a day for men. Know how much alcohol is in your drink. In the [place] one drink equals one 12 oz bottle of beer (355 mL), one 5 oz glass of wine (148 mL), or one 1 oz glass of hard liquor (44 mL). General instructions Keep a weight-loss journal. This can help you keep track of: The food that you eat. How much exercise you  get. Take over-the-counter and prescription medicines only as told by your doctor. Take vitamins and supplements only as told by your doctor. Think about joining a support group. Pay attention to your mental health as obesity can lead to depression or self esteem issues. Keep all follow-up visits. Contact a doctor if: You cannot meet your weight-loss goal after you have changed your diet and lifestyle for 6 weeks. You are having trouble breathing. Summary Obesity is having too much body fat. Being obese means that your weight is more than what is healthy for you. Work with your doctor to set a weight-loss goal. Get regular exercise as told by your doctor. This information is not intended to replace advice given to you by your health care provider. Make sure you discuss any questions you have with your health care provider. Document Revised: 02/27/2021 Document Reviewed: 02/27/2021 Elsevier Patient Education  [place].

## 2021-07-24 NOTE — Progress Notes (Signed)
I,[name],acting as a Education administrator for [employer], FNP.,have documented all relevant documentation on the behalf of [name], FNP,as directed by  [name], FNP while in the presence of [name], [place].  This visit occurred during the SARS-CoV-2 public health emergency.  Safety protocols were in place, including screening questions prior to the visit, additional usage of staff PPE, and extensive cleaning of exam room while observing appropriate contact time as indicated for disinfecting solutions.  Subjective:     Patient ID: [name] , female    DOB: February 18, 1976 , 45 y.o.   MRN: [id number]   Chief Complaint  Patient presents with   Weight Check    HPI  Patient is here for weight check.   Wt Readings from Last 3 Encounters: 07/24/21 : 237 lb 3.2 oz (107.6 kg) 05/17/21 : 229 lb 11 oz (104.2 kg) 12/19/20 : 209 lb 9.6 oz (95.1 kg)      Past Medical History:  Diagnosis Date   Acute bronchitis 09/11/2018   Acute calculous cholecystitis 09/11/2018   Acute dermatitis 09/11/2018   Acute maxillary sinusitis 09/11/2018   Acute urinary tract infection 09/11/2018   Anemia    Herpes simplex of female genitalia 09/11/2018   Hyperlipidemia    patient denies   Hypertension    Mass    on rt inner thigh   Trichomonal vaginitis 09/11/2018     Family History  Problem Relation Age of Onset   COPD Mother    Healthy Father      Current Outpatient Medications:    NUVARING 0.12-0.015 MG/24HR vaginal ring, Place 1 application vaginally every 30 (thirty) days., Disp: , Rfl: 11   olmesartan-hydrochlorothiazide (BENICAR HCT) 40-25 MG tablet, Take 1 tablet by mouth daily., Disp: 90 tablet, Rfl: 1   Allergies  Allergen Reactions   Hydrocodone-Acetaminophen Nausea And Vomiting     Review of Systems  Constitutional: Negative.   Respiratory: Negative.    Cardiovascular: Negative.  Negative for chest pain, palpitations and leg swelling.  Gastrointestinal: Negative.   Neurological:  Negative.  Negative for dizziness and headaches.  Psychiatric/Behavioral: Negative.      Today's Vitals   07/24/21 1623  BP: 128/70  Pulse: (!) 105  Temp: 98.5 F (36.9 C)  TempSrc: Oral  Weight: 237 lb 3.2 oz (107.6 kg)  Height: 5' 4.2" (1.631 m)   Body mass index is 40.46 kg/m.  Wt Readings from Last 3 Encounters:  07/24/21 237 lb 3.2 oz (107.6 kg)  05/17/21 229 lb 11 oz (104.2 kg)  12/19/20 209 lb 9.6 oz (95.1 kg)    Objective:  Physical Exam Vitals reviewed.  Constitutional:      General: She is not in acute distress.    Appearance: Normal appearance. She is obese.  Cardiovascular:     Rate and Rhythm: Normal rate and regular rhythm.     Pulses: Normal pulses.     Heart sounds: Normal heart sounds. No murmur heard. Pulmonary:     Effort: Pulmonary effort is normal. No respiratory distress.     Breath sounds: Normal breath sounds. No wheezing.  Musculoskeletal:        General: No swelling or tenderness.     Right lower leg: No edema.     Left lower leg: No edema.  Neurological:     General: No focal deficit present.     Mental Status: She is alert and oriented to person, place, and time.     Cranial Nerves: No cranial nerve deficit.  Motor: No weakness.  Psychiatric:        Mood and Affect: Mood normal.        Behavior: Behavior normal.        Thought Content: Thought content normal.        Judgment: Judgment normal.        Assessment And Plan:     1. Abnormal glucose Comments: Stable, continue to focus on diet low in sugar and starches.   2. 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 will refer to a weight management center. Discussed possibility of surgical intervention - Amb Ref to Medical Weight Management  3. Pain of right heel Comments: May be  related to plantar fascitis will try using a frozen water bottle for massage and if not better will consider xray or referral to podiatry  She is encouraged to strive for BMI less than 30 to decrease cardiac risk. Advised to aim for at 'least 150 minutes of exercise per week.    Patient was given opportunity to ask questions. Patient verbalized understanding of the plan and was able to repeat key elements of the plan. All questions were answered to their satisfaction.  [name], FNP   I, [name], FNP, have reviewed all documentation for this visit. The documentation on 08/12/21 for the exam, diagnosis, procedures, and orders are all accurate and complete.   IF YOU HAVE BEEN REFERRED TO A SPECIALIST, IT MAY TAKE 1-2 WEEKS TO SCHEDULE/PROCESS THE REFERRAL. IF YOU HAVE NOT HEARD FROM US/SPECIALIST IN TWO WEEKS, PLEASE GIVE [place] A CALL AT [phone].   THE PATIENT IS ENCOURAGED TO PRACTICE SOCIAL DISTANCING DUE TO THE COVID-19 PANDEMIC.

## 2021-08-30 ENCOUNTER — Encounter: Payer: Self-pay | Admitting: Nurse Practitioner

## 2021-09-06 ENCOUNTER — Encounter: Payer: Self-pay | Admitting: Nurse Practitioner

## 2021-09-06 ENCOUNTER — Ambulatory Visit (INDEPENDENT_AMBULATORY_CARE_PROVIDER_SITE_OTHER): Payer: BC Managed Care – PPO | Admitting: Nurse Practitioner

## 2021-09-06 ENCOUNTER — Other Ambulatory Visit: Payer: Self-pay

## 2021-09-06 ENCOUNTER — Ambulatory Visit: Payer: BC Managed Care – PPO | Admitting: Nurse Practitioner

## 2021-09-06 VITALS — BP 130/74 | HR 98 | Temp 98.4°F | Ht 64.2 in | Wt 241.0 lb

## 2021-09-06 DIAGNOSIS — Z6841 Body Mass Index (BMI) 40.0 and over, adult: Secondary | ICD-10-CM

## 2021-09-06 DIAGNOSIS — R7309 Other abnormal glucose: Secondary | ICD-10-CM

## 2021-09-06 DIAGNOSIS — I10 Essential (primary) hypertension: Secondary | ICD-10-CM

## 2021-09-06 DIAGNOSIS — R0981 Nasal congestion: Secondary | ICD-10-CM

## 2021-09-06 MED ORDER — OLMESARTAN MEDOXOMIL-HCTZ 40-25 MG PO TABS: 1.0000 | ORAL_TABLET | Freq: Every day | ORAL | 1 refills | Status: DC

## 2021-09-06 MED ORDER — SAXENDA 18 MG/3ML ~~LOC~~ SOPN: 3.0000 mg | PEN_INJECTOR | Freq: Every day | SUBCUTANEOUS | 1 refills | Status: DC

## 2021-09-06 MED ORDER — FLUTICASONE PROPIONATE 50 MCG/ACT NA SUSP: 2.0000 | Freq: Every day | NASAL | 2 refills | Status: DC

## 2021-09-06 NOTE — Progress Notes (Signed)
I,[name],acting as a Education administrator for [employer], FNP.,have documented all relevant documentation on the behalf of [name], FNP,as directed by  [name], FNP while in the presence of [name], [place].  This visit occurred during the SARS-CoV-2 public health emergency.  Safety protocols were in place, including screening questions prior to the visit, additional usage of staff PPE, and extensive cleaning of exam room while observing appropriate contact time as indicated for disinfecting solutions.  Subjective:     Patient ID: [name] , female    DOB: 11/10/1975 , 46 y.o.   MRN: [id number]   Chief Complaint  Patient presents with   Weight Check    HPI  Patient presents today for a blood pressure and weight check.    BP Readings from Last 3 Encounters: 09/06/21 : 130/74 07/24/21 : 128/70 05/17/21 : 120/84  Wt Readings from Last 3 Encounters: 09/06/21 : 241 lb (109.3 kg) 07/24/21 : 237 lb 3.2 oz (107.6 kg) 05/17/21 : 229 lb 11 oz (104.2 kg)  She was exercising 2-3 days a week.  She does feel like her job stresses her out and she would eat. Her weight this morning was 239 lbs. She just ate lunch prior to her visit.    Hypertension This is a chronic problem. The current episode started more than 1 year ago. The problem has been gradually improving since onset. The problem is controlled. Pertinent negatives include no anxiety, chest pain, headaches or palpitations. There are no associated agents to hypertension. Risk factors for coronary artery disease include obesity. Past treatments include diuretics and angiotensin blockers. There are no compliance problems.  There is no history of angina. There is no history of chronic renal disease.    Past Medical History:  Diagnosis Date   Acute bronchitis 09/11/2018   Acute calculous cholecystitis 09/11/2018   Acute dermatitis 09/11/2018   Acute maxillary sinusitis 09/11/2018   Acute urinary tract infection 09/11/2018   Anemia     Herpes simplex of female genitalia 09/11/2018   Hyperlipidemia    patient denies   Hypertension    Mass    on rt inner thigh   Trichomonal vaginitis 09/11/2018     Family History  Problem Relation Age of Onset   COPD Mother    Healthy Father      Current Outpatient Medications:    fluticasone (FLONASE) 50 MCG/ACT nasal spray, Place 2 sprays into both nostrils daily., Disp: 16 g, Rfl: 2   Liraglutide -Weight Management (SAXENDA) 18 MG/3ML SOPN, Inject 3 mg into the skin daily., Disp: 15 mL, Rfl: 1   NUVARING 0.12-0.015 MG/24HR vaginal ring, Place 1 application vaginally every 30 (thirty) days., Disp: , Rfl: 11   olmesartan-hydrochlorothiazide (BENICAR HCT) 40-25 MG tablet, Take 1 tablet by mouth daily., Disp: 90 tablet, Rfl: 1   Allergies  Allergen Reactions   Hydrocodone-Acetaminophen Nausea And Vomiting     Review of Systems  Constitutional: Negative.   HENT:         Nasal congestion   Respiratory: Negative.    Cardiovascular: Negative.  Negative for chest pain, palpitations and leg swelling.  Gastrointestinal: Negative.  Negative for abdominal pain and constipation.  Neurological: Negative.  Negative for headaches.    Today's Vitals   09/06/21 1439  BP: 130/74  Pulse: 98  Temp: 98.4 F (36.9 C)  TempSrc: Oral  Weight: 241 lb (109.3 kg)  Height: 5' 4.2" (1.631 m)   Body mass index is 41.11 kg/m.  Wt Readings  from Last 3 Encounters:  09/06/21 241 lb (109.3 kg)  07/24/21 237 lb 3.2 oz (107.6 kg)  05/17/21 229 lb 11 oz (104.2 kg)    Objective:  Physical Exam Vitals reviewed.  Constitutional:      General: She is not in acute distress.    Appearance: Normal appearance. She is obese.  Cardiovascular:     Rate and Rhythm: Normal rate and regular rhythm.     Pulses: Normal pulses.     Heart sounds: Normal heart sounds. No murmur heard. Pulmonary:     Effort: Pulmonary effort is normal. No respiratory distress.     Breath sounds: Normal breath sounds. No  wheezing.  Skin:    General: Skin is warm and dry.     Capillary Refill: Capillary refill takes less than 2 seconds.     Coloration: Skin is not jaundiced.     Findings: No bruising.  Neurological:     General: No focal deficit present.     Mental Status: She is alert and oriented to person, place, and time.     Cranial Nerves: No cranial nerve deficit.     Motor: No weakness.  Psychiatric:        Mood and Affect: Mood normal.        Behavior: Behavior normal.        Thought Content: Thought content normal.        Judgment: Judgment normal.        Assessment And Plan:     1. Essential hypertension Comments: Blood pressure is controlled, continue current medications. Encouraged to increase her physical activity.  - olmesartan-hydrochlorothiazide (BENICAR HCT) 40-25 MG tablet; Take 1 tablet by mouth daily.  Dispense: 90 tablet; Refill: 1  2. Abnormal glucose Comments: Stable, diet controlled  3. Nasal congestion Comments: Will treat with flonase.  - fluticasone (FLONASE) 50 MCG/ACT nasal spray; Place 2 sprays into both nostrils daily.  Dispense: 16 g; Refill: 2  4. Class 3 severe obesity due to excess calories with serious comorbidity and body mass index (BMI) of 40.0 to 44.9 in adult [facility]) Comments: 12 lb weight gain, she would like to do Saxenda again. Encouraged to stay at the low dose as tolerated until feeling like she is not getting full.  She is encouraged to strive for BMI less than 30 to decrease cardiac risk. Advised to aim for at least 150 minutes of exercise per week. - Liraglutide -Weight Management (SAXENDA) 18 MG/3ML SOPN; Inject 3 mg into the skin daily.  Dispense: 15 mL; Refill: 1     Patient was given opportunity to ask questions. Patient verbalized understanding of the plan and was able to repeat key elements of the plan. All questions were answered to their satisfaction.  [name], FNP   I, [name], FNP, have reviewed all documentation for this  visit. The documentation on 09/06/21 for the exam, diagnosis, procedures, and orders are all accurate and complete.   IF YOU HAVE BEEN REFERRED TO A SPECIALIST, IT MAY TAKE 1-2 WEEKS TO SCHEDULE/PROCESS THE REFERRAL. IF YOU HAVE NOT HEARD FROM US/SPECIALIST IN TWO WEEKS, PLEASE GIVE [place] A CALL AT [phone].   THE PATIENT IS ENCOURAGED TO PRACTICE SOCIAL DISTANCING DUE TO THE COVID-19 PANDEMIC.

## 2021-09-06 NOTE — Patient Instructions (Signed)

## 2021-10-22 ENCOUNTER — Encounter: Payer: Self-pay | Admitting: Nurse Practitioner

## 2021-10-24 ENCOUNTER — Other Ambulatory Visit: Payer: Self-pay

## 2021-10-24 DIAGNOSIS — S92504D Nondisplaced unspecified fracture of right lesser toe(s), subsequent encounter for fracture with routine healing: Secondary | ICD-10-CM

## 2021-11-08 ENCOUNTER — Ambulatory Visit (INDEPENDENT_AMBULATORY_CARE_PROVIDER_SITE_OTHER): Payer: BC Managed Care – PPO

## 2021-11-08 ENCOUNTER — Other Ambulatory Visit: Payer: Self-pay | Admitting: Podiatry

## 2021-11-08 ENCOUNTER — Encounter: Payer: Self-pay | Admitting: Podiatry

## 2021-11-08 ENCOUNTER — Ambulatory Visit (INDEPENDENT_AMBULATORY_CARE_PROVIDER_SITE_OTHER): Payer: BC Managed Care – PPO | Admitting: Podiatry

## 2021-11-08 ENCOUNTER — Ambulatory Visit (INDEPENDENT_AMBULATORY_CARE_PROVIDER_SITE_OTHER): Payer: BC Managed Care – PPO | Admitting: Nurse Practitioner

## 2021-11-08 ENCOUNTER — Encounter: Payer: Self-pay | Admitting: Nurse Practitioner

## 2021-11-08 VITALS — BP 122/64 | HR 95 | Temp 98.4°F | Ht 64.2 in | Wt 243.0 lb

## 2021-11-08 DIAGNOSIS — S92505A Nondisplaced unspecified fracture of left lesser toe(s), initial encounter for closed fracture: Secondary | ICD-10-CM

## 2021-11-08 DIAGNOSIS — Z6841 Body Mass Index (BMI) 40.0 and over, adult: Secondary | ICD-10-CM

## 2021-11-08 DIAGNOSIS — M722 Plantar fascial fibromatosis: Secondary | ICD-10-CM | POA: Diagnosis not present

## 2021-11-08 DIAGNOSIS — E662 Morbid (severe) obesity with alveolar hypoventilation: Secondary | ICD-10-CM

## 2021-11-08 MED ORDER — MELOXICAM 15 MG PO TABS: 15.0000 mg | ORAL_TABLET | Freq: Every day | ORAL | 3 refills | Status: DC

## 2021-11-08 MED ORDER — METHYLPREDNISOLONE 4 MG PO TBPK: ORAL_TABLET | ORAL | 0 refills | Status: DC

## 2021-11-08 MED ORDER — SAXENDA 18 MG/3ML ~~LOC~~ SOPN: 3.0000 mg | PEN_INJECTOR | Freq: Every day | SUBCUTANEOUS | 1 refills | Status: DC

## 2021-11-08 NOTE — Progress Notes (Signed)
?Industrial/product designer as a Education administrator for [employer], FNP.,have documented all relevant documentation on the behalf of [name], FNP,as directed by  [name], FNP while in the presence of [name], [name]. ? ?This visit occurred during the SARS-CoV-2 public health emergency.  Safety protocols were in place, including screening questions prior to the visit, additional usage of staff PPE, and extensive cleaning of exam room while observing appropriate contact time as indicated for disinfecting solutions. ? ?Subjective:  ?  ? Patient ID: [name] , female    DOB: 10-27-1975 , 46 y.o.   MRN: [id number] ? ? ?Chief Complaint  ?Patient presents with  ? Weight Check  ? ? ?HPI ? ?Patient presents for weight check. She has not had a sample of Saxenda or able to get at the pharmacy.  Went to foot doctor and given meloxicam to try with a brace to wear during the day and night time, she is to follow up in 1 month.  ? ?  ? ?Past Medical History:  ?Diagnosis Date  ? Acute bronchitis 09/11/2018  ? Acute calculous cholecystitis 09/11/2018  ? Acute dermatitis 09/11/2018  ? Acute maxillary sinusitis 09/11/2018  ? Acute urinary tract infection 09/11/2018  ? Anemia   ? Herpes simplex of female genitalia 09/11/2018  ? Hyperlipidemia   ? patient denies  ? Hypertension   ? Mass   ? on rt inner thigh  ? Trichomonal vaginitis 09/11/2018  ?  ? ?Family History  ?Problem Relation Age of Onset  ? COPD Mother   ? Healthy Father   ? ? ? ?Current Outpatient Medications:  ?  Liraglutide -Weight Management (SAXENDA) 18 MG/3ML SOPN, Inject 3 mg into the skin daily., Disp: 9 mL, Rfl: 1 ?  meloxicam (MOBIC) 15 MG tablet, Take 1 tablet (15 mg total) by mouth daily., Disp: 30 tablet, Rfl: 3 ?  NUVARING 0.12-0.015 MG/24HR vaginal ring, Place 1 application vaginally every 30 (thirty) days., Disp: , Rfl: 11 ?  olmesartan-hydrochlorothiazide (BENICAR HCT) 40-25 MG tablet, Take 1 tablet by mouth daily., Disp: 90 tablet, Rfl: 1  ? ?Allergies  ?Allergen  Reactions  ? Hydrocodone-Acetaminophen Nausea And Vomiting  ?  ? ?Review of Systems  ?Constitutional: Negative.  Negative for fatigue and fever.  ?Respiratory: Negative.    ?Cardiovascular: Negative.   ?Gastrointestinal: Negative.   ?Neurological: Negative.   ?Psychiatric/Behavioral: Negative.     ? ?Today's Vitals  ? 11/08/21 1531  ?BP: 122/64  ?Pulse: 95  ?Temp: 98.4 ?F (36.9 ?C)  ?TempSrc: Oral  ?Weight: 243 lb (110.2 kg)  ?Height: 5' 4.2" (1.631 m)  ? ?Body mass index is 41.45 kg/m?.  ?Wt Readings from Last 3 Encounters:  ?11/08/21 243 lb (110.2 kg)  ?09/06/21 241 lb (109.3 kg)  ?07/24/21 237 lb 3.2 oz (107.6 kg)  ? ? ?Objective:  ?Physical Exam ?Vitals reviewed.  ?Constitutional:   ?   General: She is not in acute distress. ?   Appearance: Normal appearance. She is obese.  ?Cardiovascular:  ?   Rate and Rhythm: Normal rate and regular rhythm.  ?   Pulses: Normal pulses.  ?   Heart sounds: Normal heart sounds. No murmur heard. ?Pulmonary:  ?   Effort: Pulmonary effort is normal. No respiratory distress.  ?   Breath sounds: Normal breath sounds. No wheezing.  ?Skin: ?   General: Skin is warm and dry.  ?   Capillary Refill: Capillary refill takes less than 2 seconds.  ?   Coloration: Skin is  not jaundiced.  ?   Findings: No bruising.  ?Neurological:  ?   General: No focal deficit present.  ?   Mental Status: She is alert and oriented to person, place, and time.  ?   Cranial Nerves: No cranial nerve deficit.  ?   Motor: No weakness.  ?Psychiatric:     ?   Mood and Affect: Mood normal.     ?   Behavior: Behavior normal.     ?   Thought Content: Thought content normal.     ?   Judgment: Judgment normal.  ?  ? ?   ?Assessment And Plan:  ?   ?1. 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 Advised to aim for at least 150 minutes of  exercise per week.  ?- Liraglutide -Weight Management (SAXENDA) 18 MG/3ML SOPN; Inject 3 mg into the skin daily.  Dispense: 9 mL; Refill: 1 ? ? ?Patient was given opportunity to ask questions. Patient verbalized understanding of the plan and was able to repeat key elements of the plan. All questions were answered to their satisfaction.  ?[name], FNP  ? ?I, [name], FNP, have reviewed all documentation for this visit. The documentation on 11/08/21 for the exam, diagnosis, procedures, and orders are all accurate and complete.  ? ?IF YOU HAVE BEEN REFERRED TO A SPECIALIST, IT MAY TAKE 1-2 WEEKS TO SCHEDULE/PROCESS THE REFERRAL. IF YOU HAVE NOT HEARD FROM US/SPECIALIST IN TWO WEEKS, PLEASE GIVE [place] A CALL AT [phone].  ? ?THE PATIENT IS ENCOURAGED TO PRACTICE SOCIAL DISTANCING DUE TO THE COVID-19 PANDEMIC.   ?

## 2021-11-08 NOTE — Patient Instructions (Signed)
Plantar Fasciitis Rehab Ask your health care provider which exercises are safe for you. Do exercises exactly as told by your health care provider and adjust them as directed. It is normal to feel mild stretching, pulling, tightness, or discomfort as you do these exercises. Stop right away if you feel sudden pain or your pain gets worse. Do not begin these exercises until told by your health care provider. Stretching and range-of-motion exercises These exercises warm up your muscles and joints and improve the movement and flexibility of your foot. These exercises also help to relieve pain. Plantar fascia stretch  Sit with your left / right leg crossed over your opposite knee. Hold your heel with one hand with that thumb near your arch. With your other hand, hold your toes and gently pull them back toward the top of your foot. You should feel a stretch on the base (bottom) of your toes, or the bottom of your foot (plantar fascia), or both. Hold this stretch for__________ seconds. Slowly release your toes and return to the starting position. Repeat __________ times. Complete this exercise __________ times a day. Gastrocnemius stretch, standing This exercise is also called a calf (gastroc) stretch. It stretches the muscles in the back of the upper calf. Stand with your hands against a wall. Extend your left / right leg behind you, and bend your front knee slightly. Keeping your heels on the floor, your toes facing forward, and your back knee straight, shift your weight toward the wall. Do not arch your back. You should feel a gentle stretch in your upper calf. Hold this position for __________ seconds. Repeat __________ times. Complete this exercise __________ times a day. Soleus stretch, standing This exercise is also called a calf (soleus) stretch. It stretches the muscles in the back of the lower calf. Stand with your hands against a wall. Extend your left / right leg behind you, and bend your  front knee slightly. Keeping your heels on the floor and your toes facing forward, bend your back knee and shift your weight slightly over your back leg. You should feel a gentle stretch deep in your lower calf. Hold this position for __________ seconds. Repeat __________ times. Complete this exercise __________ times a day. Gastroc and soleus stretch, standing step This exercise stretches the muscles in the back of the lower leg. These muscles are in the upper calf (gastrocnemius) and the lower calf (soleus). Stand with the ball of your left / right foot on the front of a step. The ball of your foot is on the walking surface, right under your toes. Keep your other foot firmly on the same step. Hold on to the wall or a railing for balance. Slowly lift your other foot, allowing your body weight to press your heel down over the edge of the front of the step. Keep knee straight and unbent. You should feel a stretch in your calf. Hold this position for __________ seconds. Return both feet to the step. Repeat this exercise with a slight bend in your left / right knee. Repeat __________ times with your left / right knee straight and __________ times with your left / right knee bent. Complete this exercise __________ times a day. Balance exercise This exercise builds your balance and strength control of your arch to help take pressure off your plantar fascia. Single leg stand If this exercise is too easy, you can try it with your eyes closed or while standing on a pillow. Without shoes, stand near a   railing or in a doorway. You may hold on to the railing or door frame as needed. ?Stand on your left / right foot. Keep your big toe down on the floor and lift the arch of your foot. You should feel a stretch across the bottom of your foot and your arch. Do not let your foot roll inward. ?Hold this position for __________ seconds. ?Repeat __________ times. Complete this exercise __________ times a day. ?This  information is not intended to replace advice given to you by your health care provider. Make sure you discuss any questions you have with your health care provider. ?Document Revised: 05/04/2020 Document Reviewed: 05/04/2020 ?Elsevier Patient Education ? [place]. ?Plantar Fasciitis ?Plantar fasciitis is a painful foot condition that affects the heel. It occurs when the band of tissue that connects the toes to the heel bone (plantar fascia) becomes irritated. This can happen as the result of exercising too much or doing other repetitive activities (overuse injury). ?Plantar fasciitis can cause mild irritation to severe pain that makes it difficult to walk or move. The pain is usually worse in the morning after sleeping, or after sitting or lying down for a period of time. Pain may also be worse after long periods of walking or standing. ?What are the causes? ?This condition may be caused by: ?Standing for long periods of time. ?Wearing shoes that do not have good arch support. ?Doing activities that put stress on joints (high-impact activities). This includes ballet and exercise that makes your heart beat faster (aerobic exercise), such as running. ?Being overweight. ?An abnormal way of walking (gait). ?Tight muscles in the back of your lower leg (calf). ?High arches in your feet or flat feet. ?Starting a new athletic activity. ?What are the signs or symptoms? ?The main symptom of this condition is heel pain. Pain may get worse after the following: ?Taking the first steps after a time of rest, especially in the morning after awakening, or after you have been sitting or lying down for a while. ?Long periods of standing still. ?Pain may decrease after 30-45 minutes of activity, such as gentle walking. ?How is this diagnosed? ?This condition may be diagnosed based on your medical history, a physical exam, and your symptoms. Your health care provider will check for: ?A tender area on the bottom of your  foot. ?A high arch in your foot or flat feet. ?Pain when you move your foot. ?Difficulty moving your foot. ?You may have imaging tests to confirm the diagnosis, such as: ?X-rays. ?Ultrasound. ?MRI. ?How is this treated? ?Treatment for plantar fasciitis depends on how severe your condition is. Treatment may include: ?Rest, ice, pressure (compression), and raising (elevating) the affected foot. This is called RICE therapy. Your health care provider may recommend RICE therapy along with over-the-counter pain medicines to manage your pain. ?Exercises to stretch your calves and your plantar fascia. ?A splint that holds your foot in a stretched, upward position while you sleep (night splint). ?Physical therapy to relieve symptoms and prevent problems in the future. ?Injections of steroid medicine (cortisone) to relieve pain and inflammation. ?Stimulating your plantar fascia with electrical impulses (extracorporeal shock wave therapy). This is usually the last treatment option before surgery. ?Surgery, if other treatments have not worked after 12 months. ?Follow these instructions at home: ?Managing pain, stiffness, and swelling ? ?If directed, put ice on the painful area. To do this: ?Put ice in a plastic bag, or use a frozen bottle of water. ?Place a towel between  your skin and the bag or bottle. ?Roll the bottom of your foot over the bag or bottle. ?Do this for 20 minutes, 2-3 times a day. ?Wear athletic shoes that have air-sole or gel-sole cushions, or try soft shoe inserts that are designed for plantar fasciitis. ?Elevate your foot above the level of your heart while you are sitting or lying down. ?Activity ?Avoid activities that cause pain. Ask your health care provider what activities are safe for you. ?Do physical therapy exercises and stretches as told by your health care provider. ?Try activities and forms of exercise that are easier on your joints (low impact). Examples include swimming, water aerobics, and  biking. ?General instructions ?Take over-the-counter and prescription medicines only as told by your health care provider. ?Wear a night splint while sleeping, if told by your health care provider. Loosen the splin

## 2021-11-08 NOTE — Patient Instructions (Signed)
Obesity, Adult ?Obesity is having too much body fat. Being obese means that your weight is more than what is healthy for you.  ?BMI (body mass index) is a number that explains how much body fat you have. If you have a BMI of 30 or more, you are obese. ?Obesity can cause serious health problems, such as: ?Stroke. ?Coronary artery disease (CAD). ?Type 2 diabetes. ?Some types of cancer. ?High blood pressure (hypertension). ?High cholesterol. ?Gallbladder stones. ?Obesity can also contribute to: ?Osteoarthritis. ?Sleep apnea. ?Infertility problems. ?What are the causes? ?Eating meals each day that are high in calories, sugar, and fat. ?Drinking a lot of drinks that have sugar in them. ?Being born with genes that may make you more likely to become obese. ?Having a medical condition that causes obesity. ?Taking certain medicines. ?Sitting a lot (having a sedentary lifestyle). ?Not getting enough sleep. ?What increases the risk? ?Having a family history of obesity. ?Living in an area with limited access to: ?Parks, recreation centers, or sidewalks. ?Healthy food choices, such as grocery stores and farmers' markets. ?What are the signs or symptoms? ?The main sign is having too much body fat. ?How is this treated? ?Treatment for this condition often includes changing your lifestyle. Treatment may include: ?Changing your diet. This may include making a healthy meal plan. ?Exercise. This may include activity that causes your heart to beat faster (aerobic exercise) and strength training. Work with your doctor to design a program that works for you. ?Medicine to help you lose weight. This may be used if you are not able to lose one pound a week after 6 weeks of healthy eating and more exercise. ?Treating conditions that cause the obesity. ?Surgery. Options may include gastric banding and gastric bypass. This may be done if: ?Other treatments have not helped to improve your condition. ?You have a BMI of 40 or higher. ?You have  life-threatening health problems related to obesity. ?Follow these instructions at home: ?Eating and drinking ? ?Follow advice from your doctor about what to eat and drink. Your doctor may tell you to: ?Limit fast food, sweets, and processed snack foods. ?Choose low-fat options. For example, choose low-fat milk instead of whole milk. ?Eat five or more servings of fruits or vegetables each day. ?Eat at home more often. This gives you more control over what you eat. ?Choose healthy foods when you eat out. ?Learn to read food labels. This will help you learn how much food is in one serving. ?Keep low-fat snacks available. ?Avoid drinks that have a lot of sugar in them. These include soda, fruit juice, iced tea with sugar, and flavored milk. ?Drink enough water to keep your pee (urine) pale yellow. ?Do not go on fad diets. ?Physical activity ?Exercise often, as told by your doctor. Most adults should get up to 150 minutes of moderate-intensity exercise every week.Ask your doctor: ?What types of exercise are safe for you. ?How often you should exercise. ?Warm up and stretch before being active. ?Do slow stretching after being active (cool down). ?Rest between times of being active. ?Lifestyle ?Work with your doctor and a food expert (dietitian) to set a weight-loss goal that is best for you. ?Limit your screen time. ?Find ways to reward yourself that do not involve food. ?Do not drink alcohol if: ?Your doctor tells you not to drink. ?You are pregnant, may be pregnant, or are planning to become pregnant. ?If you drink alcohol: ?Limit how much you have to: ?0-1 drink a day for women. ?0-2 drinks   a day for men. ?Know how much alcohol is in your drink. In the [place] one drink equals one 12 oz bottle of beer (355 mL), one 5 oz glass of wine (148 mL), or one 1? oz glass of hard liquor (44 mL). ?General instructions ?Keep a weight-loss journal. This can help you keep track of: ?The food that you eat. ?How much exercise you  get. ?Take over-the-counter and prescription medicines only as told by your doctor. ?Take vitamins and supplements only as told by your doctor. ?Think about joining a support group. ?Pay attention to your mental health as obesity can lead to depression or self esteem issues. ?Keep all follow-up visits. ?Contact a doctor if: ?You cannot meet your weight-loss goal after you have changed your diet and lifestyle for 6 weeks. ?You are having trouble breathing. ?Summary ?Obesity is having too much body fat. ?Being obese means that your weight is more than what is healthy for you. ?Work with your doctor to set a weight-loss goal. ?Get regular exercise as told by your doctor. ?This information is not intended to replace advice given to you by your health care provider. Make sure you discuss any questions you have with your health care provider. ?Document Revised: 02/27/2021 Document Reviewed: 02/27/2021 ?Elsevier Patient Education ? [place]. ? ?

## 2021-11-10 NOTE — Progress Notes (Signed)
?Subjective:  ?Patient ID: [name], female    DOB: 06/01/1976,  MRN: [id number] ?HPI ?Chief Complaint  ?Patient presents with  ? Foot Pain  ?  Plantar heel left - aching x 1 year, worsened 2 weeks ago, AM pain, tried water bottle and new shoes-no help  ? New Patient (Initial Visit)  ? ? ?46 y.o. female presents with the above complaint.  ? ?ROS: Denies fever chills nausea vomiting muscle aches pains calf pain back pain chest pain shortness of breath. ? ?Past Medical History:  ?Diagnosis Date  ? Acute bronchitis 09/11/2018  ? Acute calculous cholecystitis 09/11/2018  ? Acute dermatitis 09/11/2018  ? Acute maxillary sinusitis 09/11/2018  ? Acute urinary tract infection 09/11/2018  ? Anemia   ? Herpes simplex of female genitalia 09/11/2018  ? Hyperlipidemia   ? patient denies  ? Hypertension   ? Mass   ? on rt inner thigh  ? Trichomonal vaginitis 09/11/2018  ? ?Past Surgical History:  ?Procedure Laterality Date  ? BREAST EXCISIONAL BIOPSY Left 2003  ? papiloma removed  ? BREAST LUMPECTOMY WITH RADIOACTIVE SEED LOCALIZATION Right 09/08/2020  ? Procedure: RIGHT BREAST LUMPECTOMY  X 3 WITH RADIOACTIVE SEED LOCALIZATION;  Surgeon: Coralie Keens, MD;  Location: [facility];  Service: General;  Laterality: Right;  LMA  ? BREAST SURGERY    ? left breast papilloma  ? CESAREAN SECTION  07/11/03  ? LAPAROSCOPIC CHOLECYSTECTOMY SINGLE SITE WITH INTRAOPERATIVE CHOLANGIOGRAM N/A 09/11/2018  ? Procedure: LAPAROSCOPIC CHOLECYSTECTOMY SINGLE SITE WITH INTRAOPERATIVE CHOLANGIOGRAM;  Surgeon: Michael Boston, MD;  Location: [facility];  Service: General;  Laterality: N/A;  ? MASS EXCISION  05/21/05  ? inner lt thigh  ? MYOMECTOMY N/A 07/20/2015  ? Procedure: MYOMECTOMY, ABDOMINAL;  Surgeon: Arvella Nigh, MD;  Location: [facility];  Service: Gynecology;  Laterality: N/A;  ? WISDOM TOOTH EXTRACTION    ? ? ?Current Outpatient Medications:  ?  meloxicam (MOBIC) 15 MG tablet, Take 1 tablet (15 mg total) by mouth daily., Disp: 30 tablet, Rfl: 3 ?   Liraglutide -Weight Management (SAXENDA) 18 MG/3ML SOPN, Inject 3 mg into the skin daily., Disp: 9 mL, Rfl: 1 ?  NUVARING 0.12-0.015 MG/24HR vaginal ring, Place 1 application vaginally every 30 (thirty) days., Disp: , Rfl: 11 ?  olmesartan-hydrochlorothiazide (BENICAR HCT) 40-25 MG tablet, Take 1 tablet by mouth daily., Disp: 90 tablet, Rfl: 1 ? ?Allergies  ?Allergen Reactions  ? Hydrocodone-Acetaminophen Nausea And Vomiting  ? ?Review of Systems ?Objective:  ?There were no vitals filed for this visit. ? ?General: Well developed, nourished, in no acute distress, alert and oriented x3  ? ?Dermatological: Skin is warm, dry and supple bilateral. Nails x 10 are well maintained; remaining integument appears unremarkable at this time. There are no open sores, no preulcerative lesions, no rash or signs of infection present. ? ?Vascular: Dorsalis Pedis artery and Posterior Tibial artery pedal pulses are 2/4 bilateral with immedate capillary fill time. Pedal hair growth present. No varicosities and no lower extremity edema present bilateral.  ? ?Neruologic: Grossly intact via light touch bilateral. Vibratory intact via tuning fork bilateral. Protective threshold with Semmes Wienstein monofilament intact to all pedal sites bilateral. Patellar and Achilles deep tendon reflexes 2+ bilateral. No Babinski or clonus noted bilateral.  ? ?Musculoskeletal: No gross boney pedal deformities bilateral. No pain, crepitus, or limitation noted with foot and ankle range of motion bilateral. Muscular strength 5/5 in all groups tested bilateral.  Pain on palpation medial calcaneal tubercle of the left  heel. ? ? ? ?Gait: Unassisted, Nonantalgic.  ? ? ?Radiographs: ? ?Radiographs taken today demonstrate an osseously mature individual.  Posterior superior approximately oriented calcaneal heel spur soft tissue increase in density plantar fashion calcaneal insertion site plantar foot otherwise no acute findings.   ? ?Assessment & Plan:   ? ?Assessment: Plan fasciitis left ? ?Plan: Discussed etiology pathology conservative surgical therapies at this point started her on methylprednisolone to be followed by meloxicam.  Placed her in a plantar fascia brace and a night splint.  Discussed appropriate shoe gear stretching exercises ice therapy sugar modification offered her an injection which she declined.  I will follow-up with her in 1 month.  May need to consider orthotics. ? ? ? ? ?Ruth Tully T. [place], DPM ?

## 2021-11-24 ENCOUNTER — Encounter: Payer: Self-pay | Admitting: Nurse Practitioner

## 2021-11-26 ENCOUNTER — Other Ambulatory Visit: Payer: Self-pay

## 2021-11-26 DIAGNOSIS — E662 Morbid (severe) obesity with alveolar hypoventilation: Secondary | ICD-10-CM

## 2021-11-26 MED ORDER — NOVOFINE PEN NEEDLE 32G X 6 MM MISC: 3 refills | Status: DC

## 2021-12-11 ENCOUNTER — Encounter (HOSPITAL_COMMUNITY): Payer: Self-pay

## 2021-12-13 ENCOUNTER — Ambulatory Visit: Payer: BC Managed Care – PPO | Admitting: Podiatry

## 2021-12-18 ENCOUNTER — Ambulatory Visit: Payer: BC Managed Care – PPO | Admitting: Podiatry

## 2022-01-07 ENCOUNTER — Other Ambulatory Visit: Payer: Self-pay

## 2022-01-07 DIAGNOSIS — Z0289 Encounter for other administrative examinations: Secondary | ICD-10-CM

## 2022-01-07 DIAGNOSIS — E662 Morbid (severe) obesity with alveolar hypoventilation: Secondary | ICD-10-CM

## 2022-01-07 MED ORDER — SAXENDA 18 MG/3ML ~~LOC~~ SOPN: 3.0000 mg | PEN_INJECTOR | Freq: Every day | SUBCUTANEOUS | 1 refills | Status: DC

## 2022-01-11 ENCOUNTER — Telehealth: Payer: Self-pay

## 2022-01-11 NOTE — Telephone Encounter (Signed)
Left pt vm, fmla ready for pick up

## 2022-01-17 ENCOUNTER — Ambulatory Visit: Payer: BC Managed Care – PPO | Admitting: Nurse Practitioner

## 2022-01-21 ENCOUNTER — Encounter: Payer: Self-pay | Admitting: Nurse Practitioner

## 2022-01-22 ENCOUNTER — Encounter: Payer: Self-pay | Admitting: Nurse Practitioner

## 2022-01-22 ENCOUNTER — Ambulatory Visit (INDEPENDENT_AMBULATORY_CARE_PROVIDER_SITE_OTHER): Payer: BC Managed Care – PPO | Admitting: Nurse Practitioner

## 2022-01-22 VITALS — BP 132/70 | HR 98 | Temp 98.8°F | Ht 63.5 in | Wt 232.0 lb

## 2022-01-22 DIAGNOSIS — R7309 Other abnormal glucose: Secondary | ICD-10-CM

## 2022-01-22 DIAGNOSIS — I10 Essential (primary) hypertension: Secondary | ICD-10-CM | POA: Diagnosis not present

## 2022-01-22 DIAGNOSIS — K5904 Chronic idiopathic constipation: Secondary | ICD-10-CM | POA: Diagnosis not present

## 2022-01-22 DIAGNOSIS — D508 Other iron deficiency anemias: Secondary | ICD-10-CM | POA: Diagnosis not present

## 2022-01-22 DIAGNOSIS — Z6841 Body Mass Index (BMI) 40.0 and over, adult: Secondary | ICD-10-CM

## 2022-01-22 DIAGNOSIS — E662 Morbid (severe) obesity with alveolar hypoventilation: Secondary | ICD-10-CM

## 2022-01-22 MED ORDER — SCOPOLAMINE 1 MG/3DAYS TD PT72: 1.0000 | MEDICATED_PATCH | TRANSDERMAL | 0 refills | Status: DC

## 2022-01-22 NOTE — Patient Instructions (Addendum)
Obesity, Adult ?Obesity is having too much body fat. Being obese means that your weight is more than what is healthy for you.  ?BMI (body mass index) is a number that explains how much body fat you have. If you have a BMI of 30 or more, you are obese. ?Obesity can cause serious health problems, such as: ?Stroke. ?Coronary artery disease (CAD). ?Type 2 diabetes. ?Some types of cancer. ?High blood pressure (hypertension). ?High cholesterol. ?Gallbladder stones. ?Obesity can also contribute to: ?Osteoarthritis. ?Sleep apnea. ?Infertility problems. ?What are the causes? ?Eating meals each day that are high in calories, sugar, and fat. ?Drinking a lot of drinks that have sugar in them. ?Being born with genes that may make you more likely to become obese. ?Having a medical condition that causes obesity. ?Taking certain medicines. ?Sitting a lot (having a sedentary lifestyle). ?Not getting enough sleep. ?What increases the risk? ?Having a family history of obesity. ?Living in an area with limited access to: ?Parks, recreation centers, or sidewalks. ?Healthy food choices, such as grocery stores and farmers' markets. ?What are the signs or symptoms? ?The main sign is having too much body fat. ?How is this treated? ?Treatment for this condition often includes changing your lifestyle. Treatment may include: ?Changing your diet. This may include making a healthy meal plan. ?Exercise. This may include activity that causes your heart to beat faster (aerobic exercise) and strength training. Work with your doctor to design a program that works for you. ?Medicine to help you lose weight. This may be used if you are not able to lose one pound a week after 6 weeks of healthy eating and more exercise. ?Treating conditions that cause the obesity. ?Surgery. Options may include gastric banding and gastric bypass. This may be done if: ?Other treatments have not helped to improve your condition. ?You have a BMI of 40 or higher. ?You have  life-threatening health problems related to obesity. ?Follow these instructions at home: ?Eating and drinking ? ?Follow advice from your doctor about what to eat and drink. Your doctor may tell you to: ?Limit fast food, sweets, and processed snack foods. ?Choose low-fat options. For example, choose low-fat milk instead of whole milk. ?Eat five or more servings of fruits or vegetables each day. ?Eat at home more often. This gives you more control over what you eat. ?Choose healthy foods when you eat out. ?Learn to read food labels. This will help you learn how much food is in one serving. ?Keep low-fat snacks available. ?Avoid drinks that have a lot of sugar in them. These include soda, fruit juice, iced tea with sugar, and flavored milk. ?Drink enough water to keep your pee (urine) pale yellow. ?Do not go on fad diets. ?Physical activity ?Exercise often, as told by your doctor. Most adults should get up to 150 minutes of moderate-intensity exercise every week.Ask your doctor: ?What types of exercise are safe for you. ?How often you should exercise. ?Warm up and stretch before being active. ?Do slow stretching after being active (cool down). ?Rest between times of being active. ?Lifestyle ?Work with your doctor and a food expert (dietitian) to set a weight-loss goal that is best for you. ?Limit your screen time. ?Find ways to reward yourself that do not involve food. ?Do not drink alcohol if: ?Your doctor tells you not to drink. ?You are pregnant, may be pregnant, or are planning to become pregnant. ?If you drink alcohol: ?Limit how much you have to: ?0-1 drink a day for women. ?0-2 drinks   a day for men. Know how much alcohol is in your drink. In the [place] one drink equals one 12 oz bottle of beer (355 mL), one 5 oz glass of wine (148 mL), or one 1 oz glass of hard liquor (44 mL). General instructions Keep a weight-loss journal. This can help you keep track of: The food that you eat. How much exercise you  get. Take over-the-counter and prescription medicines only as told by your doctor. Take vitamins and supplements only as told by your doctor. Think about joining a support group. Pay attention to your mental health as obesity can lead to depression or self esteem issues. Keep all follow-up visits. Contact a doctor if: You cannot meet your weight-loss goal after you have changed your diet and lifestyle for 6 weeks. You are having trouble breathing. Summary Obesity is having too much body fat. Being obese means that your weight is more than what is healthy for you. Work with your doctor to set a weight-loss goal. Get regular exercise as told by your doctor. This information is not intended to replace advice given to you by your health care provider. Make sure you discuss any questions you have with your health care provider. Document Revised: 02/27/2021 Document Reviewed: 02/27/2021 Elsevier Patient Education  [place] increasing your exercise.

## 2022-01-22 NOTE — Progress Notes (Signed)
I,[name],acting as a Education administrator for [employer], FNP.,have documented all relevant documentation on the behalf of [name], FNP,as directed by  [name], FNP while in the presence of [name], [place].   Subjective:     Patient ID: [name] , female    DOB: 04/09/1976 , 46 y.o.   MRN: [id number]   Chief Complaint  Patient presents with   Weight Check    HPI  Patient presents for weight check.  She is taking Saxenda at 3 mg  Wt Readings from Last 3 Encounters: 01/22/22 : 232 lb (105.2 kg) 11/08/21 : 243 lb (110.2 kg) 09/06/21 : 241 lb (109.3 kg)       Past Medical History:  Diagnosis Date   Acute bronchitis 09/11/2018   Acute calculous cholecystitis 09/11/2018   Acute dermatitis 09/11/2018   Acute maxillary sinusitis 09/11/2018   Acute urinary tract infection 09/11/2018   Anemia    Herpes simplex of female genitalia 09/11/2018   Hyperlipidemia    patient denies   Hypertension    Mass    on rt inner thigh   Trichomonal vaginitis 09/11/2018     Family History  Problem Relation Age of Onset   COPD Mother    Healthy Father      Current Outpatient Medications:    scopolamine (TRANSDERM-SCOP) 1 MG/3DAYS, Place 1 patch (1.5 mg total) onto the skin every 3 (three) days., Disp: 10 patch, Rfl: 0   Ferric Maltol (ACCRUFER) 30 MG CAPS, Take 1 tablet by mouth daily., Disp: 30 capsule, Rfl: 3   Insulin Pen Needle (NOVOFINE PEN NEEDLE) 32G X 6 MM MISC, Use with saxenda, Disp: 100 each, Rfl: 3   Liraglutide -Weight Management (SAXENDA) 18 MG/3ML SOPN, Inject 3 mg into the skin daily., Disp: 9 mL, Rfl: 1   meloxicam (MOBIC) 15 MG tablet, Take 1 tablet (15 mg total) by mouth daily., Disp: 30 tablet, Rfl: 3   NUVARING 0.12-0.015 MG/24HR vaginal ring, Place 1 application vaginally every 30 (thirty) days., Disp: , Rfl: 11   olmesartan-hydrochlorothiazide (BENICAR HCT) 40-25 MG tablet, Take 1 tablet by mouth daily., Disp: 90 tablet, Rfl: 1   Allergies  Allergen Reactions    Hydrocodone-Acetaminophen Nausea And Vomiting     Review of Systems  Constitutional: Negative.   Respiratory: Negative.    Cardiovascular: Negative.   Gastrointestinal: Negative.   Neurological: Negative.   Psychiatric/Behavioral: Negative.       Today's Vitals   01/22/22 1520  BP: 132/70  Pulse: 98  Temp: 98.8 F (37.1 C)  TempSrc: Oral  Weight: 232 lb (105.2 kg)  Height: 5' 3.5" (1.613 m)   Body mass index is 40.45 kg/m.  Wt Readings from Last 3 Encounters:  01/22/22 232 lb (105.2 kg)  11/08/21 243 lb (110.2 kg)  09/06/21 241 lb (109.3 kg)    Objective:  Physical Exam Vitals reviewed.  Constitutional:      General: She is not in acute distress.    Appearance: Normal appearance. She is obese.  Cardiovascular:     Rate and Rhythm: Normal rate and regular rhythm.     Pulses: Normal pulses.     Heart sounds: Normal heart sounds. No murmur heard. Pulmonary:     Effort: Pulmonary effort is normal. No respiratory distress.     Breath sounds: Normal breath sounds. No wheezing.  Skin:    General: Skin is warm and dry.     Capillary Refill: Capillary refill takes less than 2 seconds.  Coloration: Skin is not jaundiced.     Findings: No bruising.  Neurological:     General: No focal deficit present.     Mental Status: She is alert and oriented to person, place, and time.     Cranial Nerves: No cranial nerve deficit.     Motor: No weakness.  Psychiatric:        Mood and Affect: Mood normal.        Behavior: Behavior normal.        Thought Content: Thought content normal.        Judgment: Judgment normal.         Assessment And Plan:     1. Essential hypertension Comments: Blood pressure is controlled  - BMP8+eGFR  2. Abnormal glucose Comments: Stable, diet controlled, continue with regular exercise and I encourage you to eat a healthy diet - Hemoglobin A1c  3. Iron deficiency anemia secondary to inadequate dietary iron intake Comments: Will recheck  levels has been having increased fatigue - Iron, TIBC and Ferritin Panel  4. Chronic idiopathic constipation Comments: Given samples of Linzess to see if effective. Continue to increase intake of water and being physically active  5. 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 Advised to aim for at least 150 minutes of exercise per week. Continue Saxenda, congratulated on an 11 lb weight loss.      Patient was given opportunity to ask questions. Patient verbalized understanding of the plan and was able to repeat key elements of the plan. All questions were answered to their satisfaction.  [name], FNP   I, [name], FNP, have reviewed all documentation for this visit. The documentation on 01/22/22 for the exam, diagnosis, procedures, and orders are all accurate and complete.   IF YOU HAVE BEEN REFERRED TO A SPECIALIST, IT MAY TAKE 1-2 WEEKS TO SCHEDULE/PROCESS THE REFERRAL. IF YOU HAVE NOT HEARD FROM US/SPECIALIST IN TWO WEEKS, PLEASE GIVE [place] A CALL AT [phone] X [phone].   THE PATIENT IS ENCOURAGED TO PRACTICE SOCIAL DISTANCING DUE TO THE COVID-19 PANDEMIC.

## 2022-01-23 ENCOUNTER — Other Ambulatory Visit: Payer: Self-pay | Admitting: Nurse Practitioner

## 2022-01-23 DIAGNOSIS — D508 Other iron deficiency anemias: Secondary | ICD-10-CM

## 2022-01-23 LAB — IRON,TIBC AND FERRITIN PANEL
Ferritin: 49 ng/mL (ref 15–150)
Iron Saturation: 12 % — ABNORMAL LOW (ref 15–55)
Iron: 41 ug/dL (ref 27–159)
Total Iron Binding Capacity: 344 ug/dL (ref 250–450)
UIBC: 303 ug/dL (ref 131–425)

## 2022-01-23 LAB — BMP8+EGFR
BUN/Creatinine Ratio: 13 (ref 9–23)
BUN: 11 mg/dL (ref 6–24)
CO2: 25 mmol/L (ref 20–29)
Calcium: 9.1 mg/dL (ref 8.7–10.2)
Chloride: 103 mmol/L (ref 96–106)
Creatinine, Ser: 0.83 mg/dL (ref 0.57–1.00)
Glucose: 104 mg/dL — ABNORMAL HIGH (ref 70–99)
Potassium: 3.5 mmol/L (ref 3.5–5.2)
Sodium: 142 mmol/L (ref 134–144)
eGFR: 89 mL/min/{1.73_m2} (ref >59)

## 2022-01-23 LAB — HEMOGLOBIN A1C
Est. average glucose Bld gHb Est-mCnc: 123 mg/dL
Hgb A1c MFr Bld: 5.9 % — ABNORMAL HIGH (ref 4.8–5.6)

## 2022-01-23 MED ORDER — ACCRUFER 30 MG PO CAPS: 1.0000 | ORAL_CAPSULE | Freq: Every day | ORAL | 3 refills | Status: DC

## 2022-01-29 ENCOUNTER — Ambulatory Visit: Payer: BC Managed Care – PPO | Admitting: Nurse Practitioner

## 2022-01-30 ENCOUNTER — Other Ambulatory Visit: Payer: Self-pay

## 2022-03-17 ENCOUNTER — Other Ambulatory Visit: Payer: Self-pay | Admitting: Nurse Practitioner

## 2022-03-17 DIAGNOSIS — E662 Morbid (severe) obesity with alveolar hypoventilation: Secondary | ICD-10-CM

## 2022-03-17 DIAGNOSIS — I10 Essential (primary) hypertension: Secondary | ICD-10-CM

## 2022-03-25 ENCOUNTER — Encounter: Payer: Self-pay | Admitting: Nurse Practitioner

## 2022-03-25 ENCOUNTER — Ambulatory Visit (INDEPENDENT_AMBULATORY_CARE_PROVIDER_SITE_OTHER): Payer: BC Managed Care – PPO | Admitting: Nurse Practitioner

## 2022-03-25 VITALS — BP 130/74 | HR 90 | Temp 98.3°F | Ht 63.5 in | Wt 231.0 lb

## 2022-03-25 DIAGNOSIS — K5904 Chronic idiopathic constipation: Secondary | ICD-10-CM | POA: Diagnosis not present

## 2022-03-25 DIAGNOSIS — Z1211 Encounter for screening for malignant neoplasm of colon: Secondary | ICD-10-CM

## 2022-03-25 DIAGNOSIS — E662 Morbid (severe) obesity with alveolar hypoventilation: Secondary | ICD-10-CM

## 2022-03-25 DIAGNOSIS — Z6841 Body Mass Index (BMI) 40.0 and over, adult: Secondary | ICD-10-CM

## 2022-03-25 NOTE — Progress Notes (Signed)
I,[name],acting as a Education administrator for [employer], FNP.,have documented all relevant documentation on the behalf of [name], FNP,as directed by  [name], FNP while in the presence of [name], [place].  Subjective:     Patient ID: [name] , female    DOB: [birth year] , 46 y.o.   MRN: [id number]   Chief Complaint  Patient presents with   Weight Check    HPI  Patient presents for weight check. She is currently on Saxenda at 3 mg daily, this dose is not curving her appetite. After she eats dinner she will crave something sweet. She is trying to stay away from the cakes and chocolate.  Her daughter left for college on Wednesday. She is trying to walk with her co-workers. She noticed when she was moving her daughter and moving more she was able to have a good bowel movement  Wt Readings from Last 3 Encounters: 03/25/22 : 231 lb (104.8 kg) 01/22/22 : 232 lb (105.2 kg) 11/08/21 : 243 lb (110.2 kg)     Past Medical History:  Diagnosis Date   Acute bronchitis 09/11/2018   Acute calculous cholecystitis 09/11/2018   Acute dermatitis 09/11/2018   Acute maxillary sinusitis 09/11/2018   Acute urinary tract infection 09/11/2018   Anemia    Herpes simplex of female genitalia 09/11/2018   Hyperlipidemia    patient denies   Hypertension    Mass    on rt inner thigh   Trichomonal vaginitis 09/11/2018     Family History  Problem Relation Age of Onset   COPD Mother    Healthy Father      Current Outpatient Medications:    Ferric Maltol (ACCRUFER) 30 MG CAPS, Take 1 tablet by mouth daily., Disp: 30 capsule, Rfl: 3   Insulin Pen Needle (NOVOFINE PEN NEEDLE) 32G X 6 MM MISC, Use with saxenda, Disp: 100 each, Rfl: 3   meloxicam (MOBIC) 15 MG tablet, Take 1 tablet (15 mg total) by mouth daily., Disp: 30 tablet, Rfl: 3   NUVARING 0.12-0.015 MG/24HR vaginal ring, Place 1 application vaginally every 30 (thirty) days., Disp: , Rfl: 11   olmesartan-hydrochlorothiazide (BENICAR HCT) 40-25  MG tablet, TAKE 1 TABLET BY MOUTH EVERY DAY, Disp: 90 tablet, Rfl: 1   SAXENDA 18 MG/3ML SOPN, INJECT 3 MG INTO THE SKIN DAILY., Disp: 15 mL, Rfl: 1   scopolamine (TRANSDERM-SCOP) 1 MG/3DAYS, Place 1 patch (1.5 mg total) onto the skin every 3 (three) days., Disp: 10 patch, Rfl: 0   Allergies  Allergen Reactions   Hydrocodone-Acetaminophen Nausea And Vomiting     Review of Systems  Constitutional: Negative.   Respiratory: Negative.    Cardiovascular: Negative.   Gastrointestinal: Negative.   Neurological: Negative.   Psychiatric/Behavioral: Negative.       Today's Vitals   03/25/22 1604  BP: 130/74  Pulse: 90  Temp: 98.3 F (36.8 C)  TempSrc: Oral  Weight: 231 lb (104.8 kg)  Height: 5' 3.5" (1.613 m)   Body mass index is 40.28 kg/m.  Wt Readings from Last 3 Encounters:  03/25/22 231 lb (104.8 kg)  01/22/22 232 lb (105.2 kg)  11/08/21 243 lb (110.2 kg)    Objective:  Physical Exam Vitals reviewed.  Constitutional:      General: She is not in acute distress.    Appearance: Normal appearance. She is obese.  Cardiovascular:     Rate and Rhythm: Normal rate and regular rhythm.     Pulses: Normal pulses.  Heart sounds: Normal heart sounds. No murmur heard. Pulmonary:     Effort: Pulmonary effort is normal. No respiratory distress.     Breath sounds: Normal breath sounds. No wheezing.  Skin:    General: Skin is warm and dry.     Capillary Refill: Capillary refill takes less than 2 seconds.     Coloration: Skin is not jaundiced.     Findings: No bruising.  Neurological:     General: No focal deficit present.     Mental Status: She is alert and oriented to person, place, and time.     Cranial Nerves: No cranial nerve deficit.     Motor: No weakness.  Psychiatric:        Mood and Affect: Mood normal.        Behavior: Behavior normal.        Thought Content: Thought content normal.        Judgment: Judgment normal.         Assessment And Plan:     1.  Chronic idiopathic constipation Comments: Get flaxseed, with lemon water and honey tea to help with bowel movements. Increase physical activity to increase gut motility.   2. 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   3. Encounter for screening colonoscopy According to USPTF Colorectal cancer Screening guidelines. Colonoscopy is recommended every 10 years, starting at age 53 years. Will refer to GI for colon cancer screening. - Ambulatory referral to Gastroenterology   Patient was given opportunity to ask questions. Patient verbalized understanding of the plan and was able to repeat key elements of the plan. All questions were answered to their satisfaction.  [name], FNP   I, [name], FNP, have reviewed all documentation for this visit. The documentation on 03/25/22 for the exam, diagnosis, procedures, and orders are all accurate and complete.   IF YOU HAVE BEEN REFERRED TO A SPECIALIST, IT MAY TAKE 1-2 WEEKS TO SCHEDULE/PROCESS THE REFERRAL. IF YOU HAVE NOT HEARD FROM US/SPECIALIST IN TWO WEEKS, PLEASE GIVE [place] A CALL AT [phone].   THE PATIENT IS ENCOURAGED TO PRACTICE SOCIAL DISTANCING DUE TO THE COVID-19 PANDEMIC.

## 2022-03-25 NOTE — Patient Instructions (Addendum)
Obesity, Adult ?Obesity is having too much body fat. Being obese means that your weight is more than what is healthy for you.  ?BMI (body mass index) is a number that explains how much body fat you have. If you have a BMI of 30 or more, you are obese. ?Obesity can cause serious health problems, such as: ?Stroke. ?Coronary artery disease (CAD). ?Type 2 diabetes. ?Some types of cancer. ?High blood pressure (hypertension). ?High cholesterol. ?Gallbladder stones. ?Obesity can also contribute to: ?Osteoarthritis. ?Sleep apnea. ?Infertility problems. ?What are the causes? ?Eating meals each day that are high in calories, sugar, and fat. ?Drinking a lot of drinks that have sugar in them. ?Being born with genes that may make you more likely to become obese. ?Having a medical condition that causes obesity. ?Taking certain medicines. ?Sitting a lot (having a sedentary lifestyle). ?Not getting enough sleep. ?What increases the risk? ?Having a family history of obesity. ?Living in an area with limited access to: ?Parks, recreation centers, or sidewalks. ?Healthy food choices, such as grocery stores and farmers' markets. ?What are the signs or symptoms? ?The main sign is having too much body fat. ?How is this treated? ?Treatment for this condition often includes changing your lifestyle. Treatment may include: ?Changing your diet. This may include making a healthy meal plan. ?Exercise. This may include activity that causes your heart to beat faster (aerobic exercise) and strength training. Work with your doctor to design a program that works for you. ?Medicine to help you lose weight. This may be used if you are not able to lose one pound a week after 6 weeks of healthy eating and more exercise. ?Treating conditions that cause the obesity. ?Surgery. Options may include gastric banding and gastric bypass. This may be done if: ?Other treatments have not helped to improve your condition. ?You have a BMI of 40 or higher. ?You have  life-threatening health problems related to obesity. ?Follow these instructions at home: ?Eating and drinking ? ?Follow advice from your doctor about what to eat and drink. Your doctor may tell you to: ?Limit fast food, sweets, and processed snack foods. ?Choose low-fat options. For example, choose low-fat milk instead of whole milk. ?Eat five or more servings of fruits or vegetables each day. ?Eat at home more often. This gives you more control over what you eat. ?Choose healthy foods when you eat out. ?Learn to read food labels. This will help you learn how much food is in one serving. ?Keep low-fat snacks available. ?Avoid drinks that have a lot of sugar in them. These include soda, fruit juice, iced tea with sugar, and flavored milk. ?Drink enough water to keep your pee (urine) pale yellow. ?Do not go on fad diets. ?Physical activity ?Exercise often, as told by your doctor. Most adults should get up to 150 minutes of moderate-intensity exercise every week.Ask your doctor: ?What types of exercise are safe for you. ?How often you should exercise. ?Warm up and stretch before being active. ?Do slow stretching after being active (cool down). ?Rest between times of being active. ?Lifestyle ?Work with your doctor and a food expert (dietitian) to set a weight-loss goal that is best for you. ?Limit your screen time. ?Find ways to reward yourself that do not involve food. ?Do not drink alcohol if: ?Your doctor tells you not to drink. ?You are pregnant, may be pregnant, or are planning to become pregnant. ?If you drink alcohol: ?Limit how much you have to: ?0-1 drink a day for women. ?0-2 drinks   a day for men. Know how much alcohol is in your drink. In the [place] one drink equals one 12 oz bottle of beer (355 mL), one 5 oz glass of wine (148 mL), or one 1 oz glass of hard liquor (44 mL). General instructions Keep a weight-loss journal. This can help you keep track of: The food that you eat. How much exercise you  get. Take over-the-counter and prescription medicines only as told by your doctor. Take vitamins and supplements only as told by your doctor. Think about joining a support group. Pay attention to your mental health as obesity can lead to depression or self esteem issues. Keep all follow-up visits. Contact a doctor if: You cannot meet your weight-loss goal after you have changed your diet and lifestyle for 6 weeks. You are having trouble breathing. Summary Obesity is having too much body fat. Being obese means that your weight is more than what is healthy for you. Work with your doctor to set a weight-loss goal. Get regular exercise as told by your doctor. This information is not intended to replace advice given to you by your health care provider. Make sure you discuss any questions you have with your health care provider. Document Revised: 02/27/2021 Document Reviewed: 02/27/2021 Elsevier Patient Education  [place] flaxseed, with lemon water and honey tea to help with bowel movements.   Encouraged to increase physical activity and to incorporate at least 2 days of strength training this can be with a resistance band or small hand weights 3 - 5 lbs.

## 2022-04-16 ENCOUNTER — Other Ambulatory Visit: Payer: Self-pay | Admitting: Nurse Practitioner

## 2022-04-16 DIAGNOSIS — E662 Morbid (severe) obesity with alveolar hypoventilation: Secondary | ICD-10-CM

## 2022-04-18 ENCOUNTER — Encounter: Payer: Self-pay | Admitting: Nurse Practitioner

## 2022-04-18 ENCOUNTER — Ambulatory Visit
Admission: EM | Admit: 2022-04-18 | Discharge: 2022-04-18 | Disposition: A | Discharge status: Home / Self Care | Payer: BC Managed Care – PPO | Attending: Emergency Medicine | Admitting: Emergency Medicine

## 2022-04-18 DIAGNOSIS — J989 Respiratory disorder, unspecified: Secondary | ICD-10-CM | POA: Diagnosis not present

## 2022-04-18 DIAGNOSIS — U071 COVID-19: Secondary | ICD-10-CM | POA: Diagnosis present

## 2022-04-18 LAB — RESP PANEL BY RT-PCR (FLU A&B, COVID) ARPGX2
Influenza A by PCR: NEGATIVE
Influenza B by PCR: NEGATIVE
SARS Coronavirus 2 by RT PCR: POSITIVE — AB

## 2022-04-18 MED ORDER — DEXAMETHASONE 6 MG PO TABS: 6.0000 mg | ORAL_TABLET | Freq: Two times a day (BID) | ORAL | 0 refills | Status: AC

## 2022-04-18 MED ORDER — PROMETHAZINE-DM 6.25-15 MG/5ML PO SYRP: 5.0000 mL | ORAL_SOLUTION | Freq: Four times a day (QID) | ORAL | 0 refills | Status: DC | PRN

## 2022-04-18 NOTE — ED Triage Notes (Signed)
Pt c/o chills, sore throat, cough, SOB and headache.  Started: Monday  Home interventions: hot tea, mucinex, sudafed

## 2022-04-18 NOTE — Discharge Instructions (Addendum)
You were tested for COVID-19 today.  This is a PCR test.  The result of your COVID-19 test will be posted to your MyChart once it is complete, typically this takes 6 to 12 hours.   If your COVID-19 test is positive you will be contacted by phone.  Because you do not have a history of being immune compromised, you are currently vaccinated for COVID-19, you are under the age of 34, you do not have a risk of severe disease due to COVID-19.  Antiviral treatment is not indicated.   Based on my physical exam findings and the history you have provided  today, I do not recommend antibiotics at this time.  I do not believe the risks and side effects of antibiotics would outweigh any minimal benefit that they might provide.       Please see the list below for recommended medications, dosages and frequencies to provide relief of current symptoms:     Decadron (dexamethasone):  To address your significant respiratory inflammation which is causing you to feel short of breath when you exert yourself, I have sent a prescription for the steroid to your pharmacy.  Please take 1 tablet twice daily for the next 5 days.  You should continue to feel the full benefit of the steroid in the next 12-24 hours.   Promethazine DM: Promethazine is both a nasal decongestant and an antinausea medication that makes most patients feel fairly sleepy.  The DM is dextromethorphan, a cough suppressant found in many over-the-counter cough medications.  Please take 5 mL before bedtime to minimize your cough which will help you sleep better.  I have sent a prescription for this medication to your pharmacy.  Please monitor yourself for worsening shortness of breath, confusio as these may indicate that your oxygen has become very low.  Please contact [phone] if this occurs.  If you do not feel better in the next 5 to 7 days, please return for follow-up of your symptoms.  Thank you for visiting urgent care today.

## 2022-04-18 NOTE — ED Provider Notes (Addendum)
[facility]    CSN: [id number] Arrival date & time: 04/18/22  1044    HISTORY   Chief Complaint  Patient presents with   Chills   Sore Throat   Headache   Shortness of Breath   HPI [name] is a pleasant, 46 y.o. female who presents to urgent care today. Patient complains of a 4-day history of subjective fever, chills, sore throat, cough and headache.  Patient states this morning she woke up with a headache and noticed that when she exerts herself, such as walking across her home or climbing stairs, she becomes short of breath.  Patient states cough is nonproductive, worse at night and keeping her awake.  Patient states she is also had body ache and significant fatigue.  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  Patient states she has been taking Sudafed, Mucinex, and taking Alka-Seltzer cold and flu with temporary relief of her symptoms which return once these medications wear off.  Patient states she is also drinking hot tea which is makes her throat feel better while drinking it.  Patient denies nausea, vomiting, diarrhea, loss of taste or smell, otalgia.  The history is provided by the patient.   Past Medical History:  Diagnosis Date   Acute bronchitis 09/11/2018   Acute calculous cholecystitis 09/11/2018   Acute dermatitis 09/11/2018   Acute maxillary sinusitis 09/11/2018   Acute urinary tract infection 09/11/2018   Anemia    Herpes simplex of female genitalia 09/11/2018   Hyperlipidemia    patient denies   Hypertension    Mass    on rt inner thigh   Trichomonal vaginitis 09/11/2018   Patient Active Problem List   Diagnosis Date Noted   Chronic knee pain after total replacement of right knee joint 08/02/2020   Situational depression 02/15/2019   Chronic pain of right knee 02/15/2019   Pneumonia of both lower lobes due to infectious  organism 10/12/2018   Closed nondisplaced fracture of lesser toe of right foot with routine healing 10/12/2018   Other constipation 09/18/2018   Right upper quadrant abdominal pain 48/18/5631   Lichenification 49/70/2637   Essential hypertension 08/21/2018   Class 2 obesity due to excess calories without serious comorbidity with body mass index (BMI) of 37.0 to 37.9 in adult 08/21/2018   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   Vertigo 05/19/2018   S/P myomectomy 07/20/2015    Class: Status post   Past Surgical History:  Procedure Laterality Date   BREAST EXCISIONAL BIOPSY Left 2003   papiloma removed   BREAST LUMPECTOMY WITH RADIOACTIVE SEED LOCALIZATION Right 09/08/2020   Procedure: RIGHT BREAST LUMPECTOMY  X 3 WITH RADIOACTIVE SEED LOCALIZATION;  Surgeon: Coralie Keens, MD;  Location: [facility];  Service: General;  Laterality: Right;  LMA   BREAST SURGERY     left breast papilloma   CESAREAN SECTION  07/11/03   LAPAROSCOPIC CHOLECYSTECTOMY SINGLE SITE WITH INTRAOPERATIVE CHOLANGIOGRAM N/A 09/11/2018   Procedure: LAPAROSCOPIC CHOLECYSTECTOMY SINGLE SITE WITH INTRAOPERATIVE CHOLANGIOGRAM;  Surgeon: Michael Boston, MD;  Location: [facility];  Service: General;  Laterality: N/A;   MASS EXCISION  05/21/05   inner lt thigh   MYOMECTOMY N/A 07/20/2015   Procedure: MYOMECTOMY, ABDOMINAL;  Surgeon: Arvella Nigh, MD;  Location: [facility];  Service: Gynecology;  Laterality: N/A;   WISDOM TOOTH EXTRACTION  OB History   No obstetric history on file.    Home Medications    Prior to Admission medications   Medication Sig Start Date End Date Taking? Authorizing Provider  Ferric Maltol (ACCRUFER) 30 MG CAPS Take 1 tablet by mouth daily. 01/23/22   [name], FNP  Insulin Pen Needle (NOVOFINE PEN NEEDLE) 32G X 6 MM MISC Use with saxenda 11/26/21   [name], FNP  meloxicam (MOBIC) 15 MG tablet Take 1 tablet (15 mg total) by mouth daily. 11/08/21   Hyatt, Max T, DPM  NUVARING  0.12-0.015 MG/24HR vaginal ring Place 1 application vaginally every 30 (thirty) days. 03/23/18   [provider]  olmesartan-hydrochlorothiazide (BENICAR HCT) 40-25 MG tablet TAKE 1 TABLET BY MOUTH EVERY DAY 03/18/22   [name], FNP  SAXENDA 18 MG/3ML SOPN INJECT 3 MG INTO THE SKIN DAILY. 04/18/22   [name], FNP  scopolamine (TRANSDERM-SCOP) 1 MG/3DAYS Place 1 patch (1.5 mg total) onto the skin every 3 (three) days. 01/22/22   [name], FNP    Family History Family History  Problem Relation Age of Onset   COPD Mother    Healthy Father    Social History Social History   Tobacco Use   Smoking status: Never   Smokeless tobacco: Never  Vaping Use   Vaping Use: Never used  Substance Use Topics   Alcohol use: No   Drug use: No   Allergies   Hydrocodone-acetaminophen  Review of Systems Review of Systems Pertinent findings revealed after performing a 14 point review of systems has been noted in the history of present illness.  Physical Exam Triage Vital Signs ED Triage Vitals  Enc Vitals Group     BP 06/01/21 0827 (!) 147/82     Pulse Rate 06/01/21 0827 72     Resp 06/01/21 0827 18     Temp 06/01/21 0827 98.3 F (36.8 C)     Temp Source 06/01/21 0827 Oral     SpO2 06/01/21 0827 98 %     Weight --      Height --      Head Circumference --      Peak Flow --      Pain Score 06/01/21 0826 5     Pain Loc --      Pain Edu? --      Excl. in [place]? --   No data found.  Updated Vital Signs BP 121/81 (BP Location: Right Arm)   Pulse 86   Temp 99.4 F (37.4 C) (Oral)   Resp 16   LMP 03/19/2022   SpO2 96%   Physical Exam Vitals and nursing note reviewed.  Constitutional:      General: She is not in acute distress.    Appearance: Normal appearance. She is not ill-appearing.  HENT:     Head: Normocephalic and atraumatic.     Salivary Glands: Right salivary gland is not diffusely enlarged or tender. Left salivary gland is not diffusely enlarged or  tender.     Right Ear: Tympanic membrane, ear canal and external ear normal. No drainage. No middle ear effusion. There is no impacted cerumen. Tympanic membrane is not erythematous or bulging.     Left Ear: Tympanic membrane, ear canal and external ear normal. No drainage.  No middle ear effusion. There is no impacted cerumen. Tympanic membrane is not erythematous or bulging.     Nose: Nose normal. No nasal deformity, septal deviation, mucosal edema, congestion or rhinorrhea.  Right Turbinates: Not enlarged, swollen or pale.     Left Turbinates: Not enlarged, swollen or pale.     Right Sinus: No maxillary sinus tenderness or frontal sinus tenderness.     Left Sinus: No maxillary sinus tenderness or frontal sinus tenderness.     Mouth/Throat:     Lips: Pink. No lesions.     Mouth: Mucous membranes are moist. No oral lesions.     Pharynx: Oropharynx is clear. Uvula midline. No posterior oropharyngeal erythema or uvula swelling.     Tonsils: No tonsillar exudate. 0 on the right. 0 on the left.  Eyes:     General: Lids are normal.        Right eye: No discharge.        Left eye: No discharge.     Extraocular Movements: Extraocular movements intact.     Conjunctiva/sclera: Conjunctivae normal.     Right eye: Right conjunctiva is not injected.     Left eye: Left conjunctiva is not injected.  Neck:     Trachea: Trachea and phonation normal.  Cardiovascular:     Rate and Rhythm: Normal rate and regular rhythm.     Pulses: Normal pulses.     Heart sounds: Normal heart sounds. No murmur heard.    No friction rub. No gallop.  Pulmonary:     Effort: Pulmonary effort is normal. No accessory muscle usage, prolonged expiration or respiratory distress.     Breath sounds: Normal breath sounds. No stridor, decreased air movement or transmitted upper airway sounds. No decreased breath sounds, wheezing, rhonchi or rales.  Chest:     Chest wall: No tenderness.  Musculoskeletal:        General:  Normal range of motion.     Cervical back: Normal range of motion and neck supple. Normal range of motion.  Lymphadenopathy:     Cervical: No cervical adenopathy.  Skin:    General: Skin is warm and dry.     Findings: No erythema or rash.  Neurological:     General: No focal deficit present.     Mental Status: She is alert and oriented to person, place, and time.  Psychiatric:        Mood and Affect: Mood normal.        Behavior: Behavior normal.     Visual Acuity Right Eye Distance:   Left Eye Distance:   Bilateral Distance:    Right Eye Near:   Left Eye Near:    Bilateral Near:     UC Couse / Diagnostics / Procedures:     Radiology No results found.  Procedures Procedures (including critical care time) EKG  Pending results:  Labs Reviewed  SARS CORONAVIRUS 2 BY RT PCR    Medications Ordered in UC: Medications - No data to display  UC Diagnoses / Final Clinical Impressions(s)   I have reviewed the triage vital signs and the nursing notes.  Pertinent labs & imaging results that were available during my care of the patient were reviewed by me and considered in my medical decision making (see chart for details).    Final diagnoses:  Respiratory illness   Patient's chief complaints and lack of physical exam findings are concerning for COVID-19 infection.  Testing performed, results will be provided to patient once complete.  Patient denies history of immunosuppression or comorbidities that would increase her risk of hospitalization due to COVID-19 infection.  Patient states she has received COVID-19 vaccinations.  Conservative care recommended.  For  patient's complaint of feeling short of breath with exertion, I sent a prescription for dexamethasone to her pharmacy.  Return precautions advised.  Of note: At end of visit, patient requested flu testing in addition to COVID-19 testing.  Patient advised that she would not qualify for Tamiflu if her flu test is positive.   Patient verbalized understanding.  ED Prescriptions     Medication Sig Dispense Auth. Provider   dexamethasone (DECADRON) 6 MG tablet Take 1 tablet (6 mg total) by mouth 2 (two) times daily with a meal for 5 days. 10 tablet Lynden Oxford Scales, PA-C   promethazine-dextromethorphan (PROMETHAZINE-DM) 6.25-15 MG/5ML syrup Take 5 mLs by mouth 4 (four) times daily as needed for cough. 118 mL Lynden Oxford Scales, PA-C      PDMP not reviewed this encounter.  Disposition Upon Discharge:  Condition: stable for discharge home Home: take medications as prescribed; routine discharge instructions as discussed; follow up as advised.  Patient presented with an acute illness with associated systemic symptoms and significant discomfort requiring urgent management. In my opinion, this is a condition that a prudent lay person (someone who possesses an average knowledge of health and medicine) may potentially expect to result in complications if not addressed urgently such as respiratory distress, impairment of bodily function or dysfunction of bodily organs.   Routine symptom specific, illness specific and/or disease specific instructions were discussed with the patient and/or caregiver at length.   As such, the patient has been evaluated and assessed, work-up was performed and treatment was provided in alignment with urgent care protocols and evidence based medicine.  Patient/parent/caregiver has been advised that the patient may require follow up for further testing and treatment if the symptoms continue in spite of treatment, as clinically indicated and appropriate.  If the patient was tested for COVID-19, Influenza and/or RSV, then the patient/parent/guardian was advised to isolate at home pending the results of his/her diagnostic coronavirus test and potentially longer if they're positive. I have also advised pt that if his/her COVID-19 test returns positive, it's recommended to self-isolate for at  least 10 days after symptoms first appeared AND until fever-free for 24 hours without fever reducer AND other symptoms have improved or resolved. Discussed self-isolation recommendations as well as instructions for household member/close contacts as per the [facility] and [place] DHHS, and also gave patient the [facility] packet with this information.  Patient/parent/caregiver has been advised to return to the [facility] or PCP in 3-5 days if no better; to PCP or the Emergency Department if new signs and symptoms develop, or if the current signs or symptoms continue to change or worsen for further workup, evaluation and treatment as clinically indicated and appropriate  The patient will follow up with their current PCP if and as advised. If the patient does not currently have a PCP we will assist them in obtaining one.   The patient may need specialty follow up if the symptoms continue, in spite of conservative treatment and management, for further workup, evaluation, consultation and treatment as clinically indicated and appropriate.  Patient/parent/caregiver verbalized understanding and agreement of plan as discussed.  All questions were addressed during visit.  Please see discharge instructions below for further details of plan.  Discharge Instructions:   Discharge Instructions      You were tested for COVID-19 today.  This is a PCR test.  The result of your COVID-19 test will be posted to your MyChart once it is complete, typically this takes 6 to 12 hours.  If your COVID-19 test is positive you will be contacted by phone.  Because you do not have a history of being immune compromised, you are currently vaccinated for COVID-19, you are under the age of 39, you do not have a risk of severe disease due to COVID-19.  Antiviral treatment is not indicated.   Based on my physical exam findings and the history you have provided  today, I do not recommend antibiotics at this time.  I do not believe the risks and side  effects of antibiotics would outweigh any minimal benefit that they might provide.       Please see the list below for recommended medications, dosages and frequencies to provide relief of current symptoms:     Decadron (dexamethasone):  To address your significant respiratory inflammation which is causing you to feel short of breath when you exert yourself, I have sent a prescription for the steroid to your pharmacy.  Please take 1 tablet twice daily for the next 5 days.  You should continue to feel the full benefit of the steroid in the next 12-24 hours.   Promethazine DM: Promethazine is both a nasal decongestant and an antinausea medication that makes most patients feel fairly sleepy.  The DM is dextromethorphan, a cough suppressant found in many over-the-counter cough medications.  Please take 5 mL before bedtime to minimize your cough which will help you sleep better.  I have sent a prescription for this medication to your pharmacy.  Please monitor yourself for worsening shortness of breath, confusio as these may indicate that your oxygen has become very low.  Please contact [phone] if this occurs.  If you do not feel better in the next 5 to 7 days, please return for follow-up of your symptoms.  Thank you for visiting urgent care today.         This office note has been dictated using Museum/gallery curator.  Unfortunately, this method of dictation can sometimes lead to typographical or grammatical errors.  I apologize for your inconvenience in advance if this occurs.  Please do not hesitate to reach out to me if clarification is needed.      Lynden Oxford Scales, PA-C 04/18/22 1156    Lynden Oxford Scales, PA-C 04/18/22 [phone]

## 2022-04-19 ENCOUNTER — Telehealth (HOSPITAL_COMMUNITY): Payer: Self-pay | Admitting: Emergency Medicine

## 2022-04-19 MED ORDER — IBUPROFEN 600 MG PO TABS: 600.0000 mg | ORAL_TABLET | Freq: Three times a day (TID) | ORAL | 0 refills | Status: DC | PRN

## 2022-04-19 NOTE — Telephone Encounter (Signed)
Patient requested pain medication for continued headache, reviewed with Dr. Lanny Cramp, okay'd ibuprofen

## 2022-04-22 ENCOUNTER — Other Ambulatory Visit: Payer: Self-pay | Admitting: Nurse Practitioner

## 2022-04-22 DIAGNOSIS — E6609 Other obesity due to excess calories: Secondary | ICD-10-CM

## 2022-04-22 MED ORDER — PHENTERMINE HCL 15 MG PO CAPS: 15.0000 mg | ORAL_CAPSULE | ORAL | 1 refills | Status: DC

## 2022-04-29 ENCOUNTER — Other Ambulatory Visit: Payer: Self-pay | Admitting: Obstetrics and Gynecology

## 2022-04-29 ENCOUNTER — Encounter: Payer: Self-pay | Admitting: Nurse Practitioner

## 2022-04-29 DIAGNOSIS — Z1231 Encounter for screening mammogram for malignant neoplasm of breast: Secondary | ICD-10-CM

## 2022-04-30 LAB — HM PAP SMEAR: HM Pap smear: NORMAL

## 2022-05-01 ENCOUNTER — Ambulatory Visit (INDEPENDENT_AMBULATORY_CARE_PROVIDER_SITE_OTHER): Payer: BC Managed Care – PPO | Admitting: Nurse Practitioner

## 2022-05-01 ENCOUNTER — Encounter: Payer: Self-pay | Admitting: Nurse Practitioner

## 2022-05-01 VITALS — BP 138/88 | HR 93 | Temp 98.6°F | Ht 63.5 in | Wt 239.0 lb

## 2022-05-01 DIAGNOSIS — R82998 Other abnormal findings in urine: Secondary | ICD-10-CM

## 2022-05-01 DIAGNOSIS — R350 Frequency of micturition: Secondary | ICD-10-CM | POA: Diagnosis not present

## 2022-05-01 DIAGNOSIS — R7309 Other abnormal glucose: Secondary | ICD-10-CM | POA: Diagnosis not present

## 2022-05-01 DIAGNOSIS — Z6841 Body Mass Index (BMI) 40.0 and over, adult: Secondary | ICD-10-CM

## 2022-05-01 LAB — POCT URINALYSIS DIPSTICK
Bilirubin, UA: NEGATIVE
Glucose, UA: NEGATIVE
Ketones, UA: NEGATIVE
Nitrite, UA: NEGATIVE
Protein, UA: POSITIVE — AB
Spec Grav, UA: 1.025 (ref 1.010–1.025)
Urobilinogen, UA: 0.2 E.U./dL
pH, UA: 7 (ref 5.0–8.0)

## 2022-05-01 MED ORDER — PHENTERMINE HCL 37.5 MG PO CAPS: 37.5000 mg | ORAL_CAPSULE | ORAL | 1 refills | Status: DC

## 2022-05-01 NOTE — Patient Instructions (Signed)
Preventing Type 2 Diabetes Mellitus Type 2 diabetes, also called type 2 diabetes mellitus, is a long-term (chronic) disease that affects sugar (glucose) levels in your blood. Normally, a hormone called insulin allows glucose to enter cells in your body. The cells use glucose for energy. With type 2 diabetes, you will have one or both of these problems: Your pancreas does not make enough insulin. Cells in your body do not respond properly to insulin that your body makes (insulin resistance). Insulin resistance or lack of insulin causes extra glucose to build up in the blood instead of going into cells. As a result, high blood glucose (hyperglycemia) develops. That can cause many complications. Being overweight or obese and having an inactive (sedentary) lifestyle can increase your risk for diabetes. Type 2 diabetes can be delayed or prevented by making certain nutrition and lifestyle changes. How can this condition affect me? If you do not take steps to prevent diabetes, your blood glucose levels may keep increasing over time. Too much glucose in your blood for a long time can damage your blood vessels, heart, kidneys, nerves, and eyes. Type 2 diabetes can lead to chronic health problems and complications, such as: Heart disease. Stroke. Blindness. Kidney disease. Depression. Poor circulation in your feet and legs. In severe cases, a foot or leg may need to be surgically removed (amputated). What can increase my risk? You may be more likely to develop type 2 diabetes if you: Have type 2 diabetes in your family. Are overweight or obese. Have a sedentary lifestyle. Have insulin resistance or a history of prediabetes. Have a history of pregnancy-related (gestational) diabetes or polycystic ovary syndrome (PCOS). What actions can I take to prevent this? It can be difficult to recognize signs of type 2 diabetes. Taking action to prevent the disease before you develop symptoms is the best way to avoid  possible damage to your body. Making certain nutrition and lifestyle changes may prevent or delay the disease and related health problems. Nutrition  Eat healthy meals and snacks regularly. Do not skip meals. Fruit or a handful of nuts is a healthy snack between meals. Drink water throughout the day. Avoid drinks that contain added sugar, such as soda or sweetened tea. Drink enough fluid to keep your urine pale yellow. Follow instructions from your health care provider about eating or drinking restrictions. Limit the amount of food you eat by: Managing how much you eat at a time (portion size). Checking food labels for the serving sizes of food. Using a kitchen scale to weigh amounts of food. Saut or steam food instead of frying it. Cook with water or broth instead of oils or butter. Limit saturated fat and salt (sodium) in your diet. Have no more than 1 tsp (2,400 mg) of sodium a day. If you have heart disease or high blood pressure, use less than ? tsp (1,500 mg) of sodium a day. Lifestyle  Lose weight if needed and as told. Your health care provider can determine how much weight loss is best for you and can help you lose weight safely. If you are overweight or obese, you may be told to lose at least 5?7% of your body weight. Manage blood pressure, cholesterol, and stress. Your health care provider will help determine the best treatment for you. Do not use any products that contain nicotine or tobacco. These products include cigarettes, chewing tobacco, and vaping devices, such as e-cigarettes. If you need help quitting, ask your health care provider. Activity  Do physical   activity that makes your heart beat faster and makes you sweat (moderate intensity). Do this for at least 30 minutes on at least 5 days of the week, or as much as told by your health care provider. Ask your health care provider what activities are safe for you. A mix of activities may be best, such as walking, swimming,  cycling, and strength training. Try to add physical activity into your day. For example: Park your car farther away than usual so that you walk more. Take a walk during your lunch break. Use stairs instead of elevators or escalators. Walk or bike to work instead of driving. Alcohol use If you drink alcohol: Limit how much you have to: 0?1 drink a day for women who are not pregnant. 0?2 drinks a day for men. Know how much alcohol is in your drink. In the [place] one drink equals one 12 oz bottle of beer (355 mL), one 5 oz glass of wine (148 mL), or one 1 oz glass of hard liquor (44 mL). General information Talk with your health care provider about your risk factors and how you can reduce your risk for diabetes. Have your blood glucose tested regularly, as told by your health care provider. Get screening tests as told by your health care provider. You may have these regularly, especially if you have certain risk factors for type 2 diabetes. Make an appointment with a registered dietitian. This diet and nutrition specialist can help you make a healthy eating plan and help you understand portion sizes and food labels. Where to find support Ask your health care provider to recommend a registered dietitian, a certified diabetes care and education specialist, or a weight loss program. Look for local or online weight loss groups. Join a gym, fitness club, or outdoor activity group, such as a walking club. Where to find more information For help and guidance and to learn more about diabetes and diabetes prevention, visit: American Diabetes Association (ADA): www.diabetes.org National Institute of Diabetes and Digestive and Kidney Diseases: www.niddk.nih.gov To learn more about healthy eating, visit: U.[place]. Department of Agriculture (USDA): www.choosemyplate.gov Office of Disease Prevention and Health Promotion (ODPHP): health.gov Summary You can delay or prevent type 2 diabetes by eating healthy  foods, losing weight if needed, and increasing your physical activity. Talk with your health care provider about your risk factors for type 2 diabetes and how you can reduce your risk. It can be difficult to recognize the signs of type 2 diabetes. The best way to avoid possible damage to your body is to take action to prevent the disease before you develop symptoms. Get screening tests as told by your health care provider. This information is not intended to replace advice given to you by your health care provider. Make sure you discuss any questions you have with your health care provider. Document Revised: 10/16/2020 Document Reviewed: 10/16/2020 Elsevier Patient Education  2023 [employer]  

## 2022-05-01 NOTE — Progress Notes (Signed)
I,[name],acting as a Education administrator for [employer], FNP.,have documented all relevant documentation on the behalf of [name], FNP,as directed by  [name], FNP while in the presence of [name], [place].  Subjective:     Patient ID: [name] , female    DOB: 1975/08/07 , 46 y.o.   MRN: [id number]   Chief Complaint  Patient presents with   Prediabetes    HPI  Patient presents today for prediabetes. Pt states that she recently went to her GYN and he had glucose in her urine. Last Friday she had blurred vision and urinary frequency. She was given a steroid when she had covid. She had been eating more, ate more sweets. She has been feeling sluggish. Two nights during that time her hands were swollen.   Wt Readings from Last 3 Encounters: 05/01/22 : 239 lb (108.4 kg) 03/25/22 : 231 lb (104.8 kg) 01/22/22 : 232 lb (105.2 kg)     Past Medical History:  Diagnosis Date   Acute bronchitis 09/11/2018   Acute calculous cholecystitis 09/11/2018   Acute dermatitis 09/11/2018   Acute maxillary sinusitis 09/11/2018   Acute urinary tract infection 09/11/2018   Anemia    Herpes simplex of female genitalia 09/11/2018   Hyperlipidemia    patient denies   Hypertension    Mass    on rt inner thigh   Trichomonal vaginitis 09/11/2018     Family History  Problem Relation Age of Onset   COPD Mother    Healthy Father      Current Outpatient Medications:    phentermine 37.5 MG capsule, Take 1 capsule (37.5 mg total) by mouth every morning., Disp: 30 capsule, Rfl: 1   ibuprofen (ADVIL) 600 MG tablet, Take 1 tablet (600 mg total) by mouth every 8 (eight) hours as needed for headache, moderate pain or mild pain., Disp: 30 tablet, Rfl: 0   Insulin Pen Needle (NOVOFINE PEN NEEDLE) 32G X 6 MM MISC, Use with saxenda, Disp: 100 each, Rfl: 3   meloxicam (MOBIC) 15 MG tablet, Take 1 tablet (15 mg total) by mouth daily., Disp: 30 tablet, Rfl: 3   NUVARING 0.12-0.015 MG/24HR vaginal ring, Place 1  application vaginally every 30 (thirty) days., Disp: , Rfl: 11   olmesartan-hydrochlorothiazide (BENICAR HCT) 40-25 MG tablet, TAKE 1 TABLET BY MOUTH EVERY DAY, Disp: 90 tablet, Rfl: 1   promethazine-dextromethorphan (PROMETHAZINE-DM) 6.25-15 MG/5ML syrup, Take 5 mLs by mouth 4 (four) times daily as needed for cough., Disp: 118 mL, Rfl: 0   Allergies  Allergen Reactions   Hydrocodone-Acetaminophen Nausea And Vomiting     Review of Systems  Constitutional: Negative.   Respiratory: Negative.    Cardiovascular: Negative.   Gastrointestinal: Negative.   Neurological: Negative.      Today's Vitals   05/01/22 0837  BP: 138/88  Pulse: 93  Temp: 98.6 F (37 C)  TempSrc: Oral  Weight: 239 lb (108.4 kg)  Height: 5' 3.5" (1.613 m)   Body mass index is 41.67 kg/m.  Wt Readings from Last 3 Encounters:  05/01/22 239 lb (108.4 kg)  03/25/22 231 lb (104.8 kg)  01/22/22 232 lb (105.2 kg)    Objective:  Physical Exam Vitals reviewed.  Constitutional:      General: She is not in acute distress.    Appearance: Normal appearance. She is obese.  Cardiovascular:     Rate and Rhythm: Normal rate and regular rhythm.     Pulses: Normal pulses.     Heart sounds:  Normal heart sounds. No murmur heard. Pulmonary:     Effort: Pulmonary effort is normal. No respiratory distress.     Breath sounds: Normal breath sounds. No wheezing.  Skin:    General: Skin is warm and dry.     Capillary Refill: Capillary refill takes less than 2 seconds.     Coloration: Skin is not jaundiced.     Findings: No bruising.  Neurological:     General: No focal deficit present.     Mental Status: She is alert and oriented to person, place, and time.     Cranial Nerves: No cranial nerve deficit.     Motor: No weakness.  Psychiatric:        Mood and Affect: Mood normal.        Behavior: Behavior normal.        Thought Content: Thought content normal.        Judgment: Judgment normal.        Assessment And  Plan:     1. Abnormal glucose HgbA1c was slightly up at last visit and recent visit to GYN had glucose in urine, repeat done today negative for glucose may be medication induced - Hemoglobin A1c  2. Frequent urination - POCT Urinalysis Dipstick (81002) - Culture, Urine  3. Class 3 severe obesity due to excess calories without serious comorbidity with body mass index (BMI) of 40.0 to 44.9 in adult [facility]) She is encouraged to strive for BMI less than 30 to decrease cardiac risk. Advised to aim for at least 150 minutes of exercise per week. Increased Phentermine to 37.'5mg'$ , encouraged to try group exercise classes. Unfortunately her weight has increased.  - phentermine 37.5 MG capsule; Take 1 capsule (37.5 mg total) by mouth every morning.  Dispense: 30 capsule; Refill: 1  4. Urine white blood cells increased Small white cells in urine, will send for culture - Culture, Urine    Patient was given opportunity to ask questions. Patient verbalized understanding of the plan and was able to repeat key elements of the plan. All questions were answered to their satisfaction.  [name], FNP   I, [name], FNP, have reviewed all documentation for this visit. The documentation on 05/01/22 for the exam, diagnosis, procedures, and orders are all accurate and complete.   IF YOU HAVE BEEN REFERRED TO A SPECIALIST, IT MAY TAKE 1-2 WEEKS TO SCHEDULE/PROCESS THE REFERRAL. IF YOU HAVE NOT HEARD FROM US/SPECIALIST IN TWO WEEKS, PLEASE GIVE [place] A CALL AT [phone].   THE PATIENT IS ENCOURAGED TO PRACTICE SOCIAL DISTANCING DUE TO THE COVID-19 PANDEMIC.

## 2022-05-02 ENCOUNTER — Other Ambulatory Visit: Payer: Self-pay | Admitting: Nurse Practitioner

## 2022-05-02 LAB — BMP8+EGFR
BUN/Creatinine Ratio: 15 (ref 9–23)
BUN: 11 mg/dL (ref 6–24)
CO2: 22 mmol/L (ref 20–29)
Calcium: 9.1 mg/dL (ref 8.7–10.2)
Chloride: 103 mmol/L (ref 96–106)
Creatinine, Ser: 0.71 mg/dL (ref 0.57–1.00)
Glucose: 95 mg/dL (ref 70–99)
Potassium: 4.1 mmol/L (ref 3.5–5.2)
Sodium: 141 mmol/L (ref 134–144)
eGFR: 107 mL/min/{1.73_m2} (ref >59)

## 2022-05-02 LAB — HEMOGLOBIN A1C
Est. average glucose Bld gHb Est-mCnc: 131 mg/dL
Hgb A1c MFr Bld: 6.2 % — ABNORMAL HIGH (ref 4.8–5.6)

## 2022-05-02 MED ORDER — PHENTERMINE HCL 37.5 MG PO TABS: 37.5000 mg | ORAL_TABLET | Freq: Every day | ORAL | 1 refills | Status: DC

## 2022-05-03 LAB — URINE CULTURE

## 2022-05-14 ENCOUNTER — Other Ambulatory Visit: Payer: Self-pay | Admitting: Nurse Practitioner

## 2022-05-14 MED ORDER — METFORMIN HCL 500 MG PO TABS: 500.0000 mg | ORAL_TABLET | Freq: Two times a day (BID) | ORAL | 3 refills | Status: DC

## 2022-05-27 ENCOUNTER — Encounter: Payer: Self-pay | Admitting: Nurse Practitioner

## 2022-05-27 ENCOUNTER — Ambulatory Visit (INDEPENDENT_AMBULATORY_CARE_PROVIDER_SITE_OTHER): Payer: BC Managed Care – PPO | Admitting: Nurse Practitioner

## 2022-05-27 VITALS — BP 122/78 | HR 101 | Temp 98.5°F | Ht 63.5 in | Wt 236.0 lb

## 2022-05-27 DIAGNOSIS — Z0000 Encounter for general adult medical examination without abnormal findings: Secondary | ICD-10-CM | POA: Diagnosis not present

## 2022-05-27 DIAGNOSIS — E66813 Obesity, class 3: Secondary | ICD-10-CM

## 2022-05-27 DIAGNOSIS — Z6841 Body Mass Index (BMI) 40.0 and over, adult: Secondary | ICD-10-CM

## 2022-05-27 DIAGNOSIS — E662 Morbid (severe) obesity with alveolar hypoventilation: Secondary | ICD-10-CM

## 2022-05-27 DIAGNOSIS — R7309 Other abnormal glucose: Secondary | ICD-10-CM

## 2022-05-27 DIAGNOSIS — I10 Essential (primary) hypertension: Secondary | ICD-10-CM

## 2022-05-27 DIAGNOSIS — D508 Other iron deficiency anemias: Secondary | ICD-10-CM | POA: Diagnosis not present

## 2022-05-27 LAB — POCT URINALYSIS DIPSTICK
Bilirubin, UA: NEGATIVE
Glucose, UA: NEGATIVE
Ketones, UA: NEGATIVE
Nitrite, UA: NEGATIVE
Protein, UA: NEGATIVE
Spec Grav, UA: 1.02 (ref 1.010–1.025)
Urobilinogen, UA: 0.2 E.U./dL
pH, UA: 6.5 (ref 5.0–8.0)

## 2022-05-27 MED ORDER — PHENTERMINE HCL 37.5 MG PO TABS: 37.5000 mg | ORAL_TABLET | Freq: Every day | ORAL | 1 refills | Status: DC

## 2022-05-27 NOTE — Progress Notes (Signed)
I,[name],acting as a Education administrator for [employer], FNP.,have documented all relevant documentation on the behalf of Anita Brine, FNP,as directed by  Anita Brine, FNP while in the presence of [name], [place].  Subjective:     Patient ID: [name] , female    DOB: 11-Nov-1975 , 46 y.o.   MRN: [id number]   Chief Complaint  Patient presents with   Annual Exam    HPI  Patient presents today for hm. She is followed by Dr. Radene Knee at [facility] for her GYN care. She is scheduled for January for her colonoscopy with Dr. Collene Mares.   She is doing better with her diet. She is eating more fruits. Her exercise is not as consistent as she wants it to be. She is doing Yoga exercises.   Wt Readings from Last 3 Encounters: 05/27/22 : 236 lb (107 kg) 05/01/22 : 239 lb (108.4 kg) 03/25/22 : 231 lb (104.8 kg)  Her weight at home was 233 lbs.      Past Medical History:  Diagnosis Date   Acute bronchitis 09/11/2018   Acute calculous cholecystitis 09/11/2018   Acute dermatitis 09/11/2018   Acute maxillary sinusitis 09/11/2018   Acute urinary tract infection 09/11/2018   Anemia    Herpes simplex of female genitalia 09/11/2018   Hyperlipidemia    patient denies   Hypertension    Mass    on rt inner thigh   Trichomonal vaginitis 09/11/2018     Family History  Problem Relation Age of Onset   COPD Mother    Healthy Father      Current Outpatient Medications:    ibuprofen (ADVIL) 600 MG tablet, Take 1 tablet (600 mg total) by mouth every 8 (eight) hours as needed for headache, moderate pain or mild pain., Disp: 30 tablet, Rfl: 0   Insulin Pen Needle (NOVOFINE PEN NEEDLE) 32G X 6 MM MISC, Use with saxenda, Disp: 100 each, Rfl: 3   metFORMIN (GLUCOPHAGE) 500 MG tablet, Take 1 tablet (500 mg total) by mouth 2 (two) times daily with a meal., Disp: 60 tablet, Rfl: 3   NUVARING 0.12-0.015 MG/24HR vaginal ring, Place 1 application vaginally every 30 (thirty) days., Disp: , Rfl: 11    olmesartan-hydrochlorothiazide (BENICAR HCT) 40-25 MG tablet, TAKE 1 TABLET BY MOUTH EVERY DAY, Disp: 90 tablet, Rfl: 1   phentermine (ADIPEX-P) 37.5 MG tablet, Take 1 tablet (37.5 mg total) by mouth daily before breakfast., Disp: 30 tablet, Rfl: 1   Allergies  Allergen Reactions   Hydrocodone-Acetaminophen Nausea And Vomiting      The patient states she uses NuvaRing vaginal inserts for birth control.  Patient's last menstrual period was 05/23/2022.. Negative for Dysmenorrhea and Negative for Menorrhagia. Negative for: breast discharge, breast lump(s), breast pain and breast self exam. Associated symptoms include abnormal vaginal bleeding. Pertinent negatives include abnormal bleeding (hematology), anxiety, decreased libido, depression, difficulty falling sleep, dyspareunia, history of infertility, nocturia, sexual dysfunction, sleep disturbances, urinary incontinence, urinary urgency, vaginal discharge and vaginal itching. Diet regular.The patient states her exercise level is    The patient's tobacco use is:  Social History   Tobacco Use  Smoking Status Never  Smokeless Tobacco Never   She has been exposed to passive smoke. The patient's alcohol use is:  Social History   Substance and Sexual Activity  Alcohol Use No  Additional information: Last pap 2023, next one scheduled for 2024 per patient has them done yearly    Review of Systems  Constitutional: Negative.  HENT: Negative.    Eyes: Negative.   Respiratory: Negative.    Cardiovascular: Negative.   Gastrointestinal: Negative.   Endocrine: Negative.   Genitourinary: Negative.   Musculoskeletal: Negative.   Skin: Negative.   Allergic/Immunologic: Negative.   Neurological: Negative.   Hematological: Negative.   Psychiatric/Behavioral: Negative.       Today's Vitals   05/27/22 0956  BP: 122/78  Pulse: (!) 101  Temp: 98.5 F (36.9 C)  TempSrc: Oral  Weight: 236 lb (107 kg)  Height: 5' 3.5" (1.613 m)   Body mass  index is 41.15 kg/m.  Wt Readings from Last 3 Encounters:  05/27/22 236 lb (107 kg)  05/01/22 239 lb (108.4 kg)  03/25/22 231 lb (104.8 kg)    Objective:  Physical Exam Vitals reviewed.  Constitutional:      General: She is not in acute distress.    Appearance: Normal appearance. She is well-developed. She is obese.  HENT:     Head: Normocephalic and atraumatic.     Right Ear: Hearing, tympanic membrane, ear canal and external ear normal. There is no impacted cerumen.     Left Ear: Hearing, tympanic membrane, ear canal and external ear normal. There is no impacted cerumen.     Nose: Nose normal.     Mouth/Throat:     Mouth: Mucous membranes are moist.  Eyes:     General: Lids are normal.     Extraocular Movements: Extraocular movements intact.     Conjunctiva/sclera: Conjunctivae normal.     Pupils: Pupils are equal, round, and reactive to light.     Funduscopic exam:    Right eye: No papilledema.        Left eye: No papilledema.  Neck:     Thyroid: No thyroid mass.     Vascular: No carotid bruit.  Cardiovascular:     Rate and Rhythm: Normal rate and regular rhythm.     Pulses: Normal pulses.     Heart sounds: Normal heart sounds. No murmur heard. Pulmonary:     Effort: Pulmonary effort is normal. No respiratory distress.     Breath sounds: Normal breath sounds. No wheezing.  Abdominal:     General: Abdomen is flat. Bowel sounds are normal. There is no distension.     Palpations: Abdomen is soft.     Tenderness: There is no abdominal tenderness.  Genitourinary:    Comments: Followed by GYN Musculoskeletal:        General: No swelling or tenderness. Normal range of motion.     Cervical back: Full passive range of motion without pain, normal range of motion and neck supple.     Right lower leg: No edema.     Left lower leg: No edema.  Skin:    General: Skin is warm and dry.     Capillary Refill: Capillary refill takes less than 2 seconds.  Neurological:      General: No focal deficit present.     Mental Status: She is alert and oriented to person, place, and time.     Cranial Nerves: No cranial nerve deficit.     Sensory: No sensory deficit.  Psychiatric:        Mood and Affect: Mood normal.        Behavior: Behavior normal.        Thought Content: Thought content normal.        Judgment: Judgment normal.         Assessment And Plan:  1. Encounter for general adult medical examination w/o abnormal findings Behavior modifications discussed and diet history reviewed.   Pt will continue to exercise regularly and modify diet with low GI, plant based foods and decrease intake of processed foods.  Recommend intake of daily multivitamin, Vitamin D, and calcium.  Recommend mammogram and colonoscopy for preventive screenings, as well as recommend immunizations that include influenza, TDAP  2. Essential hypertension Comments: Blood pressure is better controlled, continue focusing on diet low in salt.  - POCT Urinalysis Dipstick (81002) - Microalbumin / Creatinine Urine Ratio - EKG 12-Lead  3. Abnormal glucose Comments: HgbA1c increased slightly at last visit. Continue focusing on healthy diet low in sugar and starches. - Lipid panel  4. Iron deficiency anemia secondary to inadequate dietary iron intake Comments: Iron levels were low at last visit, has been taking iron supplement. Will check levels today  5. 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 Advised to aim for at least 150 minutes of exercise per week.   Patient was given opportunity to ask questions. Patient verbalized understanding of the plan and was able to repeat key elements of the plan. All questions were answered to their satisfaction.   Anita Brine, FNP   I, Anita Brine, FNP, have  reviewed all documentation for this visit. The documentation on 05/27/22 for the exam, diagnosis, procedures, and orders are all accurate and complete.   THE PATIENT IS ENCOURAGED TO PRACTICE SOCIAL DISTANCING DUE TO THE COVID-19 PANDEMIC.

## 2022-05-27 NOTE — Patient Instructions (Signed)

## 2022-05-29 LAB — MICROALBUMIN / CREATININE URINE RATIO
Creatinine, Urine: 115.1 mg/dL
Microalb/Creat Ratio: 5 mg/g creat (ref 0–29)
Microalbumin, Urine: 5.9 ug/mL

## 2022-05-29 LAB — LIPID PANEL
Chol/HDL Ratio: 3 ratio (ref 0.0–4.4)
Cholesterol, Total: 223 mg/dL — ABNORMAL HIGH (ref 100–199)
HDL: 74 mg/dL (ref >39)
LDL Chol Calc (NIH): 137 mg/dL — ABNORMAL HIGH (ref 0–99)
Triglycerides: 69 mg/dL (ref 0–149)
VLDL Cholesterol Cal: 12 mg/dL (ref 5–40)

## 2022-06-10 ENCOUNTER — Ambulatory Visit
Admission: RE | Admit: 2022-06-10 | Discharge: 2022-06-10 | Disposition: A | Discharge status: Home / Self Care | Payer: BC Managed Care – PPO | Source: Ambulatory Visit | Attending: Obstetrics and Gynecology | Admitting: Obstetrics and Gynecology

## 2022-06-10 DIAGNOSIS — Z1231 Encounter for screening mammogram for malignant neoplasm of breast: Secondary | ICD-10-CM

## 2022-06-13 ENCOUNTER — Other Ambulatory Visit: Payer: Self-pay | Admitting: Obstetrics and Gynecology

## 2022-06-13 DIAGNOSIS — R928 Other abnormal and inconclusive findings on diagnostic imaging of breast: Secondary | ICD-10-CM

## 2022-06-15 ENCOUNTER — Encounter: Payer: Self-pay | Admitting: Nurse Practitioner

## 2022-06-17 DIAGNOSIS — E559 Vitamin D deficiency, unspecified: Secondary | ICD-10-CM | POA: Insufficient documentation

## 2022-06-18 ENCOUNTER — Ambulatory Visit
Admission: RE | Admit: 2022-06-18 | Discharge: 2022-06-18 | Disposition: A | Discharge status: Home / Self Care | Payer: BC Managed Care – PPO | Source: Ambulatory Visit | Attending: Obstetrics and Gynecology | Admitting: Obstetrics and Gynecology

## 2022-06-18 ENCOUNTER — Ambulatory Visit: Payer: BC Managed Care – PPO | Admitting: Nurse Practitioner

## 2022-06-18 DIAGNOSIS — R928 Other abnormal and inconclusive findings on diagnostic imaging of breast: Secondary | ICD-10-CM

## 2022-06-20 ENCOUNTER — Encounter: Payer: Self-pay | Admitting: Nurse Practitioner

## 2022-06-20 ENCOUNTER — Ambulatory Visit (INDEPENDENT_AMBULATORY_CARE_PROVIDER_SITE_OTHER): Payer: BC Managed Care – PPO | Admitting: Nurse Practitioner

## 2022-06-20 VITALS — BP 130/66 | HR 89 | Temp 98.5°F | Ht 63.0 in | Wt 237.0 lb

## 2022-06-20 DIAGNOSIS — M79605 Pain in left leg: Secondary | ICD-10-CM

## 2022-06-20 DIAGNOSIS — R200 Anesthesia of skin: Secondary | ICD-10-CM | POA: Diagnosis not present

## 2022-06-20 DIAGNOSIS — R202 Paresthesia of skin: Secondary | ICD-10-CM

## 2022-06-20 MED ORDER — TRIAMCINOLONE ACETONIDE 40 MG/ML IJ SUSP: 40.0000 mg | Freq: Once | INTRAMUSCULAR | Status: DC

## 2022-06-20 NOTE — Patient Instructions (Addendum)
Paresthesia Paresthesia is an abnormal burning or prickling sensation. It is usually felt in the hands, arms, legs, or feet. However, it may occur in any part of the body. Usually, paresthesia is not painful. It may feel like: Tingling or numbness. Buzzing. Itching. Paresthesia may occur without any clear cause, or it may be caused by: Breathing too quickly (hyperventilation). Pressure on a nerve. An underlying medical condition. Side effects of a medicine. Nutritional deficiencies. Exposure to toxic chemicals. Most people experience temporary (transient) paresthesia at some time in their lives. For some people, it may be long-lasting (chronic) because of an underlying medical condition. If you have paresthesia that lasts a long time, you need to be evaluated by your health care provider. Follow these instructions at home: Nutrition Eat a healthy diet. This includes: Eating foods that are high in fiber, such as beans, whole grains, and fresh fruits and vegetables. Limiting foods that are high in fat and processed sugars, such as fried or sweet foods.  Alcohol use  Avoid or limit alcohol. Too much alcohol can cause a vitamin B deficiency, and vitamin B is needed for healthy nerves. Do not drink alcohol if: Your health care provider tells you not to drink. You are pregnant, may be pregnant, or are planning to become pregnant. If you drink alcohol: Limit how much you have to: 0-1 drink a day for women. 0-2 drinks a day for men. Know how much alcohol is in your drink. In the [place] one drink equals one 12 oz bottle of beer (355 mL), one 5 oz glass of wine (148 mL), or one 1 oz glass of hard liquor (44 mL). General instructions Take over-the-counter and prescription medicines only as told by your health care provider. Do not use any products that contain nicotine or tobacco. These products include cigarettes, chewing tobacco, and vaping devices, such as e-cigarettes. If you need help  quitting, ask your health care provider. If you have diabetes, work closely with your health care provider to keep your blood sugar under control. If you have numbness in your feet: Check every day for signs of injury or infection. Watch for redness, warmth, and swelling. Wear padded socks and comfortable shoes. These help protect your feet. Keep all follow-up visits. This is important. Contact a health care provider if you: Have paresthesia that gets worse or does not go away. Have numbness after an injury. Have a burning or prickling feeling that gets worse when you walk. Have pain, cramps, or dizziness, or you faint. Develop a rash. Get help right away if you: Feel muscle weakness. Develop new weakness in an arm or leg. Have trouble walking or moving. Have problems with speech, understanding, or vision. Feel confused. Cannot control your bladder or bowel movements. These symptoms may be an emergency. Get help right away. Call 911. Do not wait to see if the symptoms will go away. Do not drive yourself to the hospital. Summary Paresthesia is an abnormal burning or prickling sensation that is usually felt in the hands, arms, legs, or feet. It may also occur in other parts of the body. Paresthesia may occur without any clear cause, or it may be caused by breathing too quickly (hyperventilation), pressure on a nerve, an underlying medical condition, side effects of a medicine, nutritional deficiencies, or exposure to toxic chemicals. If you have paresthesia that lasts a long time, you need to be evaluated by your health care provider. This information is not intended to replace advice given to you by  your health care provider. Make sure you discuss any questions you have with your health care provider. Document Revised: 04/02/2021 Document Reviewed: 04/02/2021 Elsevier Patient Education  [employer]. Paresthesia Paresthesia is a burning or prickling feeling. This feeling can happen  in any part of the body. It often happens in the hands, arms, legs, or feet. Usually, it is not painful. In most cases, the feeling goes away in a short time and is not a sign of a serious problem. If you have paresthesia that lasts a long time, you need to see your doctor. Follow these instructions at home: Nutrition Eat a healthy diet. This includes: Eating foods that are high in fiber. These include beans, whole grains, and fresh fruits and vegetables. Limiting foods that are high in fat and sugar. These include fried or sweet foods.  Alcohol use  Do not drink alcohol if: Your doctor tells you not to drink. You are pregnant, may be pregnant, or are planning to become pregnant. If you drink alcohol: Limit how much you have to: 0-1 drink a day for women. 0-2 drinks a day for men. Know how much alcohol is in your drink. In the [place] one drink equals one 12 oz bottle of beer (355 mL), one 5 oz glass of wine (148 mL), or one 1 oz glass of hard liquor (44 mL). General instructions Take over-the-counter and prescription medicines only as told by your doctor. Do not smoke or use any products that contain nicotine or tobacco. If you need help quitting, ask your doctor. If you have diabetes, work with your doctor to make sure your blood sugar stays in a healthy range. If your feet feel numb: Check for redness, warmth, and swelling every day. Wear padded socks and comfortable shoes. These help protect your feet. Keep all follow-up visits. Contact a doctor if: You have paresthesia that gets worse or does not go away. You lose feeling (have numbness) after an injury. Your burning or prickling feeling gets worse when you walk. You have pain or cramps. You feel dizzy or you faint. You have a rash. Get help right away if: You feel weak or have new weakness in an arm or leg. You have trouble walking or moving. You have problems speaking, understanding, or seeing. You feel confused. You cannot  control when you pee (urinate) or poop (have a bowel movement). These symptoms may be an emergency. Get help right away. Call 911. Do not wait to see if the symptoms will go away. Do not drive yourself to the hospital. Summary Paresthesia is a burning or prickling feeling. It often happens in the hands, arms, legs, or feet. In most cases, the feeling goes away in a short time and is not a sign of a serious problem. If you have paresthesia that lasts a long time, you need to be seen by your doctor. This information is not intended to replace advice given to you by your health care provider. Make sure you discuss any questions you have with your health care provider. Document Revised: 04/02/2021 Document Reviewed: 04/02/2021 Elsevier Patient Education  [place].

## 2022-06-20 NOTE — Progress Notes (Signed)
Barnet Glasgow Martin,acting as a Education administrator for [name], FNP.,have documented all relevant documentation on the behalf of [name], FNP,as directed by  [name], FNP while in the presence of [name], [place].    Subjective:     Patient ID: [name] , female    DOB: 1975/09/06 , 46 y.o.   MRN: [id number]   Chief Complaint  Patient presents with   Numbness    HPI  Patient presents today for numbness and tingling in leg. Patient states her left leg has been tingling since last Tuesday on and off. Patient states it does hurt and the numbness started in her calf but has now moved to her whole leg and foot.  Patient was giving gabapentin 100 mgs at urgent care, patient states that's the only thing that helped. Patient wanted to discuss her labs from the urgent care on 06/15/2022, patient hasn't received the labs back yet. She wen to [facility].   Her blood pressure was elevated during that visit. Symptoms started with her calf the whole foot and thigh. When she changes positions not making any changes.     Past Medical History:  Diagnosis Date   Acute bronchitis 09/11/2018   Acute calculous cholecystitis 09/11/2018   Acute dermatitis 09/11/2018   Acute maxillary sinusitis 09/11/2018   Acute urinary tract infection 09/11/2018   Anemia    Herpes simplex of female genitalia 09/11/2018   Hyperlipidemia    patient denies   Hypertension    Mass    on rt inner thigh   Trichomonal vaginitis 09/11/2018     Family History  Problem Relation Age of Onset   COPD Mother    Healthy Father      Current Outpatient Medications:    gabapentin (NEURONTIN) 100 MG capsule, Take 100 mg by mouth as needed., Disp: , Rfl:    Insulin Pen Needle (NOVOFINE PEN NEEDLE) 32G X 6 MM MISC, Use with saxenda, Disp: 100 each, Rfl: 3   metFORMIN (GLUCOPHAGE) 500 MG tablet, Take 1 tablet (500 mg total) by mouth 2 (two) times daily with a meal., Disp: 60 tablet, Rfl: 3   NUVARING 0.12-0.015 MG/24HR  vaginal ring, Place 1 application vaginally every 30 (thirty) days., Disp: , Rfl: 11   olmesartan-hydrochlorothiazide (BENICAR HCT) 40-25 MG tablet, TAKE 1 TABLET BY MOUTH EVERY DAY, Disp: 90 tablet, Rfl: 1   phentermine (ADIPEX-P) 37.5 MG tablet, Take 1 tablet (37.5 mg total) by mouth daily before breakfast., Disp: 30 tablet, Rfl: 1  Current Facility-Administered Medications:    triamcinolone acetonide (KENALOG-40) injection 40 mg, 40 mg, Intramuscular, Once, [name], FNP   Allergies  Allergen Reactions   Hydrocodone-Acetaminophen Nausea And Vomiting     Review of Systems  Constitutional: Negative.   HENT: Negative.    Eyes: Negative.   Respiratory: Negative.    Cardiovascular: Negative.   Gastrointestinal: Negative.   Psychiatric/Behavioral: Negative.       Today's Vitals   06/20/22 1541  BP: 130/66  Pulse: 89  Temp: 98.5 F (36.9 C)  TempSrc: Oral  Weight: 237 lb (107.5 kg)  Height: '5\' 3"'$  (1.6 m)  PainSc: 8   PainLoc: Leg   Body mass index is 41.98 kg/m.   Objective:  Physical Exam Vitals reviewed.  Constitutional:      General: She is not in acute distress.    Appearance: Normal appearance. She is obese.  Cardiovascular:     Pulses: Normal pulses.     Heart sounds:  Normal heart sounds. No murmur heard. Pulmonary:     Effort: Pulmonary effort is normal. No respiratory distress.     Breath sounds: Normal breath sounds. No wheezing.  Musculoskeletal:        General: No swelling or tenderness. Normal range of motion.     Comments: Small varicose vein noted to posterior calf left leg  Skin:    General: Skin is warm and dry.     Capillary Refill: Capillary refill takes less than 2 seconds.  Neurological:     General: No focal deficit present.     Mental Status: She is alert and oriented to person, place, and time.     Cranial Nerves: No cranial nerve deficit.     Motor: No weakness.         Assessment And Plan:     1. Numbness and tingling of left  leg Comments: Paresthesia meralgia vs radiculopathy vs vitamin Deficiency. Will get records from [place]. Negative straight leg test. - VAS [place] LOWER EXTREMITY VENOUS (DVT); Future - triamcinolone acetonide (KENALOG-40) injection 40 mg - D-dimer, quantitative  2. Left leg pain Comments: Pain to calf. Will check D-dimer and send for venous doppleer to evaluate for DVT low suspiscion however had some swelling a couple weeks ago - VAS [place] LOWER EXTREMITY VENOUS (DVT); Future - triamcinolone acetonide (KENALOG-40) injection 40 mg     Patient was given opportunity to ask questions. Patient verbalized understanding of the plan and was able to repeat key elements of the plan. All questions were answered to their satisfaction.  [name], FNP   I, [name], FNP, have reviewed all documentation for this visit. The documentation on 06/20/22 for the exam, diagnosis, procedures, and orders are all accurate and complete.   IF YOU HAVE BEEN REFERRED TO A SPECIALIST, IT MAY TAKE 1-2 WEEKS TO SCHEDULE/PROCESS THE REFERRAL. IF YOU HAVE NOT HEARD FROM US/SPECIALIST IN TWO WEEKS, PLEASE GIVE [place] A CALL AT [phone].   THE PATIENT IS ENCOURAGED TO PRACTICE SOCIAL DISTANCING DUE TO THE COVID-19 PANDEMIC.

## 2022-06-21 ENCOUNTER — Ambulatory Visit (HOSPITAL_COMMUNITY)
Admission: RE | Admit: 2022-06-21 | Discharge: 2022-06-21 | Disposition: A | Discharge status: Home / Self Care | Payer: BC Managed Care – PPO | Source: Ambulatory Visit | Attending: Nurse Practitioner | Admitting: Nurse Practitioner

## 2022-06-21 DIAGNOSIS — R200 Anesthesia of skin: Secondary | ICD-10-CM

## 2022-06-21 DIAGNOSIS — M79605 Pain in left leg: Secondary | ICD-10-CM | POA: Diagnosis present

## 2022-06-21 DIAGNOSIS — R202 Paresthesia of skin: Secondary | ICD-10-CM | POA: Insufficient documentation

## 2022-06-21 LAB — D-DIMER, QUANTITATIVE: D-DIMER: 0.41 mg/L FEU (ref 0.00–0.49)

## 2022-07-03 ENCOUNTER — Encounter: Payer: Self-pay | Admitting: Nurse Practitioner

## 2022-08-07 ENCOUNTER — Ambulatory Visit (INDEPENDENT_AMBULATORY_CARE_PROVIDER_SITE_OTHER): Payer: BC Managed Care – PPO | Admitting: Nurse Practitioner

## 2022-08-07 ENCOUNTER — Encounter: Payer: Self-pay | Admitting: Nurse Practitioner

## 2022-08-07 VITALS — BP 128/68 | HR 93 | Temp 98.7°F | Ht 63.0 in | Wt 238.0 lb

## 2022-08-07 DIAGNOSIS — Z6841 Body Mass Index (BMI) 40.0 and over, adult: Secondary | ICD-10-CM | POA: Diagnosis not present

## 2022-08-07 DIAGNOSIS — R051 Acute cough: Secondary | ICD-10-CM

## 2022-08-07 DIAGNOSIS — R0981 Nasal congestion: Secondary | ICD-10-CM | POA: Diagnosis not present

## 2022-08-07 DIAGNOSIS — E662 Morbid (severe) obesity with alveolar hypoventilation: Secondary | ICD-10-CM | POA: Diagnosis not present

## 2022-08-07 MED ORDER — PHENTERMINE HCL 37.5 MG PO TABS: 37.5000 mg | ORAL_TABLET | Freq: Every day | ORAL | 1 refills | Status: DC

## 2022-08-07 MED ORDER — BENZONATATE 100 MG PO CAPS: 100.0000 mg | ORAL_CAPSULE | Freq: Four times a day (QID) | ORAL | 1 refills | Status: DC | PRN

## 2022-08-07 MED ORDER — CETIRIZINE HCL 10 MG PO TABS: 10.0000 mg | ORAL_TABLET | Freq: Every day | ORAL | 2 refills | Status: DC

## 2022-08-07 MED ORDER — FLUTICASONE PROPIONATE 50 MCG/ACT NA SUSP: 2.0000 | Freq: Every day | NASAL | 2 refills | Status: DC

## 2022-08-07 NOTE — Patient Instructions (Signed)
Obesity, Adult ?Obesity is having too much body fat. Being obese means that your weight is more than what is healthy for you.  ?BMI (body mass index) is a number that explains how much body fat you have. If you have a BMI of 30 or more, you are obese. ?Obesity can cause serious health problems, such as: ?Stroke. ?Coronary artery disease (CAD). ?Type 2 diabetes. ?Some types of cancer. ?High blood pressure (hypertension). ?High cholesterol. ?Gallbladder stones. ?Obesity can also contribute to: ?Osteoarthritis. ?Sleep apnea. ?Infertility problems. ?What are the causes? ?Eating meals each day that are high in calories, sugar, and fat. ?Drinking a lot of drinks that have sugar in them. ?Being born with genes that may make you more likely to become obese. ?Having a medical condition that causes obesity. ?Taking certain medicines. ?Sitting a lot (having a sedentary lifestyle). ?Not getting enough sleep. ?What increases the risk? ?Having a family history of obesity. ?Living in an area with limited access to: ?Parks, recreation centers, or sidewalks. ?Healthy food choices, such as grocery stores and farmers' markets. ?What are the signs or symptoms? ?The main sign is having too much body fat. ?How is this treated? ?Treatment for this condition often includes changing your lifestyle. Treatment may include: ?Changing your diet. This may include making a healthy meal plan. ?Exercise. This may include activity that causes your heart to beat faster (aerobic exercise) and strength training. Work with your doctor to design a program that works for you. ?Medicine to help you lose weight. This may be used if you are not able to lose one pound a week after 6 weeks of healthy eating and more exercise. ?Treating conditions that cause the obesity. ?Surgery. Options may include gastric banding and gastric bypass. This may be done if: ?Other treatments have not helped to improve your condition. ?You have a BMI of 40 or higher. ?You have  life-threatening health problems related to obesity. ?Follow these instructions at home: ?Eating and drinking ? ?Follow advice from your doctor about what to eat and drink. Your doctor may tell you to: ?Limit fast food, sweets, and processed snack foods. ?Choose low-fat options. For example, choose low-fat milk instead of whole milk. ?Eat five or more servings of fruits or vegetables each day. ?Eat at home more often. This gives you more control over what you eat. ?Choose healthy foods when you eat out. ?Learn to read food labels. This will help you learn how much food is in one serving. ?Keep low-fat snacks available. ?Avoid drinks that have a lot of sugar in them. These include soda, fruit juice, iced tea with sugar, and flavored milk. ?Drink enough water to keep your pee (urine) pale yellow. ?Do not go on fad diets. ?Physical activity ?Exercise often, as told by your doctor. Most adults should get up to 150 minutes of moderate-intensity exercise every week.Ask your doctor: ?What types of exercise are safe for you. ?How often you should exercise. ?Warm up and stretch before being active. ?Do slow stretching after being active (cool down). ?Rest between times of being active. ?Lifestyle ?Work with your doctor and a food expert (dietitian) to set a weight-loss goal that is best for you. ?Limit your screen time. ?Find ways to reward yourself that do not involve food. ?Do not drink alcohol if: ?Your doctor tells you not to drink. ?You are pregnant, may be pregnant, or are planning to become pregnant. ?If you drink alcohol: ?Limit how much you have to: ?0-1 drink a day for women. ?0-2 drinks   a day for men. ?Know how much alcohol is in your drink. In the [place] one drink equals one 12 oz bottle of beer (355 mL), one 5 oz glass of wine (148 mL), or one 1? oz glass of hard liquor (44 mL). ?General instructions ?Keep a weight-loss journal. This can help you keep track of: ?The food that you eat. ?How much exercise you  get. ?Take over-the-counter and prescription medicines only as told by your doctor. ?Take vitamins and supplements only as told by your doctor. ?Think about joining a support group. ?Pay attention to your mental health as obesity can lead to depression or self esteem issues. ?Keep all follow-up visits. ?Contact a doctor if: ?You cannot meet your weight-loss goal after you have changed your diet and lifestyle for 6 weeks. ?You are having trouble breathing. ?Summary ?Obesity is having too much body fat. ?Being obese means that your weight is more than what is healthy for you. ?Work with your doctor to set a weight-loss goal. ?Get regular exercise as told by your doctor. ?This information is not intended to replace advice given to you by your health care provider. Make sure you discuss any questions you have with your health care provider. ?Document Revised: 02/27/2021 Document Reviewed: 02/27/2021 ?Elsevier Patient Education ? 2023 Elsevier Inc. ? ?

## 2022-08-07 NOTE — Progress Notes (Signed)
I,[name],acting as a Education administrator for [employer], FNP.,have documented all relevant documentation on the behalf of Anita Brine, FNP,as directed by  Anita Brine, FNP while in the presence of [name], [place].  Subjective:     Patient ID: [name] , female    DOB: 1976/07/13 , 47 y.o.   MRN: [id number]   Chief Complaint  Patient presents with   Weight Check    HPI  Patient presents today for weight check. She also has been having cold symptoms since 07/30/22.  She is exercising 2-3 times a week at home and now back at [employer].  Reports her diet has been better, she does not have any junk food in the house with her fruits and vegetables.   Wt Readings from Last 3 Encounters: 08/07/22 : 238 lb (108 kg) 06/20/22 : 237 lb (107.5 kg) 05/27/22 : 236 lb (107 kg)       Past Medical History:  Diagnosis Date   Acute bronchitis 09/11/2018   Acute calculous cholecystitis 09/11/2018   Acute dermatitis 09/11/2018   Acute maxillary sinusitis 09/11/2018   Acute urinary tract infection 09/11/2018   Anemia    Herpes simplex of female genitalia 09/11/2018   Hyperlipidemia    patient denies   Hypertension    Mass    on rt inner thigh   Trichomonal vaginitis 09/11/2018     Family History  Problem Relation Age of Onset   COPD Mother    Healthy Father      Current Outpatient Medications:    benzonatate (TESSALON PERLES) 100 MG capsule, Take 1 capsule (100 mg total) by mouth every 6 (six) hours as needed., Disp: 30 capsule, Rfl: 1   cetirizine (ZYRTEC ALLERGY) 10 MG tablet, Take 1 tablet (10 mg total) by mouth daily., Disp: 30 tablet, Rfl: 2   fluticasone (FLONASE) 50 MCG/ACT nasal spray, Place 2 sprays into both nostrils daily., Disp: 16 g, Rfl: 2   gabapentin (NEURONTIN) 100 MG capsule, Take 100 mg by mouth as needed., Disp: , Rfl:    Insulin Pen Needle (NOVOFINE PEN NEEDLE) 32G X 6 MM MISC, Use with saxenda, Disp: 100 each, Rfl: 3   metFORMIN (GLUCOPHAGE) 500 MG tablet, Take  1 tablet (500 mg total) by mouth 2 (two) times daily with a meal., Disp: 60 tablet, Rfl: 3   NUVARING 0.12-0.015 MG/24HR vaginal ring, Place 1 application vaginally every 30 (thirty) days., Disp: , Rfl: 11   phentermine (ADIPEX-P) 37.5 MG tablet, Take 1 tablet (37.5 mg total) by mouth daily before breakfast., Disp: 30 tablet, Rfl: 1  Current Facility-Administered Medications:    triamcinolone acetonide (KENALOG-40) injection 40 mg, 40 mg, Intramuscular, Once, Anita Brine, FNP   Allergies  Allergen Reactions   Hydrocodone-Acetaminophen Nausea And Vomiting     Review of Systems  Constitutional: Negative.   HENT:  Positive for congestion.   Respiratory:  Positive for cough.   Cardiovascular: Negative.   Gastrointestinal: Negative.   Genitourinary: Negative.   Neurological: Negative.   Psychiatric/Behavioral: Negative.       Today's Vitals   08/07/22 1557  BP: 128/68  Pulse: 93  Temp: 98.7 F (37.1 C)  TempSrc: Oral  Weight: 238 lb (108 kg)  Height: '5\' 3"'$  (1.6 m)   Body mass index is 42.16 kg/m.  Wt Readings from Last 3 Encounters:  08/07/22 238 lb (108 kg)  06/20/22 237 lb (107.5 kg)  05/27/22 236 lb (107 kg)    Objective:  Physical Exam Vitals reviewed.  Constitutional:      General: She is not in acute distress.    Appearance: Normal appearance. She is obese.  HENT:     Right Ear: Tympanic membrane, ear canal and external ear normal. There is no impacted cerumen.     Left Ear: Tympanic membrane, ear canal and external ear normal. There is no impacted cerumen.     Nose: Congestion present.     Mouth/Throat:     Mouth: Mucous membranes are moist.  Cardiovascular:     Rate and Rhythm: Normal rate and regular rhythm.     Pulses: Normal pulses.     Heart sounds: Normal heart sounds. No murmur heard. Pulmonary:     Effort: Pulmonary effort is normal. No respiratory distress.     Breath sounds: Normal breath sounds. No wheezing.  Skin:    General: Skin is warm  and dry.     Capillary Refill: Capillary refill takes less than 2 seconds.     Coloration: Skin is not jaundiced.     Findings: No bruising.  Neurological:     General: No focal deficit present.     Mental Status: She is alert and oriented to person, place, and time.     Cranial Nerves: No cranial nerve deficit.     Motor: No weakness.  Psychiatric:        Mood and Affect: Mood normal.        Behavior: Behavior normal.        Thought Content: Thought content normal.        Judgment: Judgment normal.         Assessment And Plan:     1. Acute cough - cetirizine (ZYRTEC ALLERGY) 10 MG tablet; Take 1 tablet (10 mg total) by mouth daily.  Dispense: 30 tablet; Refill: 2 - benzonatate (TESSALON PERLES) 100 MG capsule; Take 1 capsule (100 mg total) by mouth every 6 (six) hours as needed.  Dispense: 30 capsule; Refill: 1  2. Nasal congestion Comments: She has head congestion for approximately 8 days.  Will treat with Flonase and Zyrtec.  Also given nasal rinse.  No fever or chills.  Symptoms slowly improving. Symptoms ongoing too long to consider testing for influenza, covid or rsv (she is outside the window of treatment) - cetirizine (ZYRTEC ALLERGY) 10 MG tablet; Take 1 tablet (10 mg total) by mouth daily.  Dispense: 30 tablet; Refill: 2  3. 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  Unsure of why it could not be filled last month will make [place] aware if there is a problem.  Will consider Zepbound.  She is encouraged to strive for BMI less than 30 to decrease cardiac risk. Advised to aim for at least 150 minutes of exercise per week. - phentermine (ADIPEX-P) 37.5 MG tablet; Take 1 tablet (37.5 mg total) by mouth daily before breakfast.  Dispense: 30 tablet; Refill: 1   Colonoscopy is scheduled for the 19th of January.   Patient was given  opportunity to ask questions. Patient verbalized understanding of the plan and was able to repeat key elements of the plan. All questions were answered to their satisfaction.  Anita Brine, FNP   I, Anita Brine, FNP, have reviewed all documentation for this visit. The documentation on 08/07/22 for the exam, diagnosis, procedures, and orders are all accurate and complete.   IF YOU HAVE BEEN  REFERRED TO A SPECIALIST, IT MAY TAKE 1-2 WEEKS TO SCHEDULE/PROCESS THE REFERRAL. IF YOU HAVE NOT HEARD FROM US/SPECIALIST IN TWO WEEKS, PLEASE GIVE [place] A CALL AT [phone].   THE PATIENT IS ENCOURAGED TO PRACTICE SOCIAL DISTANCING DUE TO THE COVID-19 PANDEMIC.

## 2022-08-10 ENCOUNTER — Other Ambulatory Visit: Payer: Self-pay | Admitting: Nurse Practitioner

## 2022-08-23 LAB — HM COLONOSCOPY

## 2022-09-22 ENCOUNTER — Encounter: Payer: Self-pay | Admitting: Nurse Practitioner

## 2022-09-22 ENCOUNTER — Other Ambulatory Visit: Payer: Self-pay | Admitting: Nurse Practitioner

## 2022-09-22 DIAGNOSIS — I10 Essential (primary) hypertension: Secondary | ICD-10-CM

## 2022-09-23 ENCOUNTER — Other Ambulatory Visit: Payer: Self-pay | Admitting: Nurse Practitioner

## 2022-09-23 DIAGNOSIS — I10 Essential (primary) hypertension: Secondary | ICD-10-CM

## 2022-09-23 MED ORDER — OLMESARTAN MEDOXOMIL-HCTZ 40-25 MG PO TABS: 1.0000 | ORAL_TABLET | Freq: Every day | ORAL | 1 refills | Status: DC

## 2022-09-23 NOTE — Telephone Encounter (Signed)
Rx was discontinued but no reason was given. Not sure if she should be on this rx or not. Please advise, thanks!

## 2022-10-07 ENCOUNTER — Encounter: Payer: Self-pay | Admitting: Nurse Practitioner

## 2022-10-07 ENCOUNTER — Ambulatory Visit (INDEPENDENT_AMBULATORY_CARE_PROVIDER_SITE_OTHER): Payer: BC Managed Care – PPO | Admitting: Nurse Practitioner

## 2022-10-07 VITALS — BP 122/78 | HR 100 | Temp 98.3°F | Ht 63.0 in | Wt 224.0 lb

## 2022-10-07 DIAGNOSIS — I10 Essential (primary) hypertension: Secondary | ICD-10-CM | POA: Diagnosis not present

## 2022-10-07 DIAGNOSIS — Z6839 Body mass index (BMI) 39.0-39.9, adult: Secondary | ICD-10-CM

## 2022-10-07 DIAGNOSIS — R7303 Prediabetes: Secondary | ICD-10-CM

## 2022-10-07 DIAGNOSIS — E785 Hyperlipidemia, unspecified: Secondary | ICD-10-CM

## 2022-10-07 MED ORDER — PHENTERMINE HCL 37.5 MG PO TABS: 37.5000 mg | ORAL_TABLET | Freq: Every day | ORAL | 1 refills | Status: DC

## 2022-10-07 NOTE — Patient Instructions (Addendum)
Call your insurance company to see if they improve Zepbound.  Congratulations on your 14 lb weight loss, I am proud of you

## 2022-10-07 NOTE — Progress Notes (Signed)
I,Sheena H Holbrook,acting as a Education administrator for [name], FNP.,have documented all relevant documentation on the behalf of [name], FNP,as directed by  [name], FNP while in the presence of [name], [place].    Subjective:     Patient ID: [name] , female    DOB: 1976/01/01 , 47 y.o.   MRN: [id number]   Chief Complaint  Patient presents with   Hypertension   Weight Check    HPI  Patient presents today for follow up on hypertension and weight check. She had her colonoscopy and has not craved sweets. She was unable to get a refill of Saxenda.   Wt Readings from Last 3 Encounters: 10/07/22 : 224 lb (101.6 kg) 08/07/22 : 238 lb (108 kg) 06/20/22 : 237 lb (107.5 kg)    Hypertension This is a chronic problem. The problem is controlled. Pertinent negatives include no anxiety. There are no known risk factors for coronary artery disease. There are no compliance problems.  There is no history of angina. There is no history of chronic renal disease.     Past Medical History:  Diagnosis Date   Acute bronchitis 09/11/2018   Acute calculous cholecystitis 09/11/2018   Acute dermatitis 09/11/2018   Acute maxillary sinusitis 09/11/2018   Acute urinary tract infection 09/11/2018   Anemia    Closed nondisplaced fracture of lesser toe of right foot with routine healing 10/12/2018   Herpes simplex of female genitalia 09/11/2018   Hyperlipidemia    patient denies   Hypertension    Mass    on rt inner thigh   Pneumonia of both lower lobes due to infectious organism 10/12/2018   Situational depression 02/15/2019   Trichomonal vaginitis 09/11/2018     Family History  Problem Relation Age of Onset   COPD Mother    Healthy Father      Current Outpatient Medications:    metFORMIN (GLUCOPHAGE) 500 MG tablet, TAKE 1 TABLET BY MOUTH 2 TIMES DAILY WITH A MEAL., Disp: 180 tablet, Rfl: 1   NUVARING 0.12-0.015 MG/24HR vaginal ring, Place 1 application vaginally every 30  (thirty) days., Disp: , Rfl: 11   olmesartan-hydrochlorothiazide (BENICAR HCT) 40-25 MG tablet, Take 1 tablet by mouth daily., Disp: 90 tablet, Rfl: 1   fluticasone (FLONASE) 50 MCG/ACT nasal spray, Place 2 sprays into both nostrils daily. (Patient not taking: Reported on 10/07/2022), Disp: 16 g, Rfl: 2   phentermine (ADIPEX-P) 37.5 MG tablet, Take 1 tablet (37.5 mg total) by mouth daily before breakfast., Disp: 30 tablet, Rfl: 1  Current Facility-Administered Medications:    triamcinolone acetonide (KENALOG-40) injection 40 mg, 40 mg, Intramuscular, Once, [name], FNP   Allergies  Allergen Reactions   Hydrocodone-Acetaminophen Nausea And Vomiting     Review of Systems  Constitutional: Negative.   HENT:  Negative for congestion.   Respiratory:  Negative for cough.   Cardiovascular: Negative.   Gastrointestinal: Negative.   Genitourinary: Negative.   Neurological: Negative.   Psychiatric/Behavioral: Negative.       Today's Vitals   10/07/22 1559  BP: 122/78  Pulse: 100  Temp: 98.3 F (36.8 C)  TempSrc: Oral  SpO2: 98%  Weight: 224 lb (101.6 kg)  Height: '5\' 3"'$  (1.6 m)   Body mass index is 39.68 kg/m.   Objective:  Physical Exam Vitals reviewed.  Constitutional:      General: She is not in acute distress.    Appearance: Normal appearance. She is obese.  HENT:     Right  Ear: Tympanic membrane, ear canal and external ear normal. There is no impacted cerumen.     Left Ear: Tympanic membrane, ear canal and external ear normal. There is no impacted cerumen.  Cardiovascular:     Rate and Rhythm: Normal rate and regular rhythm.     Pulses: Normal pulses.     Heart sounds: Normal heart sounds. No murmur heard. Pulmonary:     Effort: Pulmonary effort is normal. No respiratory distress.     Breath sounds: Normal breath sounds. No wheezing.  Skin:    General: Skin is warm and dry.     Capillary Refill: Capillary refill takes less than 2 seconds.     Coloration: Skin is  not jaundiced.     Findings: No bruising.  Neurological:     General: No focal deficit present.     Mental Status: She is alert and oriented to person, place, and time.     Cranial Nerves: No cranial nerve deficit.     Motor: No weakness.  Psychiatric:        Mood and Affect: Mood normal.        Behavior: Behavior normal.        Thought Content: Thought content normal.        Judgment: Judgment normal.         Assessment And Plan:     1. Essential hypertension Comments: blood pressure is well controlled. Continue current medications - BMP8+eGFR  2. Elevated lipids Comments: Cholesterol levels were slightly elevated at last visit. Encouraged to follow a low fat diet. - Lipid panel  3. Prediabetes Comments: HgbA1c was up at last visit. Continue focusing on low sugar and carb diet. - Hemoglobin A1c  4. Class 2 severe obesity due to excess calories with serious comorbidity and body mass index (BMI) of 39.0 to 39.9 in adult [facility]) Comments: Continue phentermine 37.5 mg daily.  congratulated on 14 lb weight loss.  Will consider Zepbound - phentermine (ADIPEX-P) 37.5 MG tablet; Take 1 tablet (37.5 mg total) by mouth daily before breakfast.  Dispense: 30 tablet; Refill: 1     Patient was given opportunity to ask questions. Patient verbalized understanding of the plan and was able to repeat key elements of the plan. All questions were answered to their satisfaction.  [name], FNP   I, [name], FNP, have reviewed all documentation for this visit. The documentation on 10/07/22 for the exam, diagnosis, procedures, and orders are all accurate and complete.   IF YOU HAVE BEEN REFERRED TO A SPECIALIST, IT MAY TAKE 1-2 WEEKS TO SCHEDULE/PROCESS THE REFERRAL. IF YOU HAVE NOT HEARD FROM US/SPECIALIST IN TWO WEEKS, PLEASE GIVE [place] A CALL AT [phone].   THE PATIENT IS ENCOURAGED TO PRACTICE SOCIAL DISTANCING DUE TO THE COVID-19 PANDEMIC.

## 2022-10-08 LAB — LIPID PANEL
Chol/HDL Ratio: 3 ratio (ref 0.0–4.4)
Cholesterol, Total: 213 mg/dL — ABNORMAL HIGH (ref 100–199)
HDL: 71 mg/dL (ref >39)
LDL Chol Calc (NIH): 126 mg/dL — ABNORMAL HIGH (ref 0–99)
Triglycerides: 92 mg/dL (ref 0–149)
VLDL Cholesterol Cal: 16 mg/dL (ref 5–40)

## 2022-10-08 LAB — BMP8+EGFR
BUN/Creatinine Ratio: 12 (ref 9–23)
BUN: 8 mg/dL (ref 6–24)
CO2: 25 mmol/L (ref 20–29)
Calcium: 9.5 mg/dL (ref 8.7–10.2)
Chloride: 101 mmol/L (ref 96–106)
Creatinine, Ser: 0.69 mg/dL (ref 0.57–1.00)
Glucose: 75 mg/dL (ref 70–99)
Potassium: 3.7 mmol/L (ref 3.5–5.2)
Sodium: 139 mmol/L (ref 134–144)
eGFR: 109 mL/min/{1.73_m2} (ref >59)

## 2022-10-08 LAB — HEMOGLOBIN A1C
Est. average glucose Bld gHb Est-mCnc: 128 mg/dL
Hgb A1c MFr Bld: 6.1 % — ABNORMAL HIGH (ref 4.8–5.6)

## 2022-12-09 ENCOUNTER — Ambulatory Visit: Payer: BC Managed Care – PPO | Admitting: Nurse Practitioner

## 2022-12-11 ENCOUNTER — Ambulatory Visit (INDEPENDENT_AMBULATORY_CARE_PROVIDER_SITE_OTHER): Payer: BC Managed Care – PPO | Admitting: Nurse Practitioner

## 2022-12-11 VITALS — BP 130/70 | HR 78 | Temp 98.1°F | Ht 63.0 in | Wt 221.6 lb

## 2022-12-11 DIAGNOSIS — R350 Frequency of micturition: Secondary | ICD-10-CM | POA: Diagnosis not present

## 2022-12-11 DIAGNOSIS — L739 Follicular disorder, unspecified: Secondary | ICD-10-CM | POA: Insufficient documentation

## 2022-12-11 DIAGNOSIS — Z6839 Body mass index (BMI) 39.0-39.9, adult: Secondary | ICD-10-CM

## 2022-12-11 DIAGNOSIS — G4709 Other insomnia: Secondary | ICD-10-CM | POA: Diagnosis not present

## 2022-12-11 HISTORY — DX: Frequency of micturition: R35.0

## 2022-12-11 LAB — POCT URINALYSIS DIPSTICK
Bilirubin, UA: NEGATIVE
Glucose, UA: NEGATIVE
Ketones, UA: NEGATIVE
Leukocytes, UA: NEGATIVE
Nitrite, UA: NEGATIVE
Protein, UA: NEGATIVE
Spec Grav, UA: 1.025 (ref 1.010–1.025)
Urobilinogen, UA: 1 E.U./dL
pH, UA: 6.5 (ref 5.0–8.0)

## 2022-12-11 MED ORDER — PHENTERMINE HCL 37.5 MG PO TABS: 37.5000 mg | ORAL_TABLET | Freq: Every day | ORAL | 1 refills | Status: DC

## 2022-12-11 MED ORDER — NITROFURANTOIN MONOHYD MACRO 100 MG PO CAPS
100.0000 mg | ORAL_CAPSULE | Freq: Two times a day (BID) | ORAL | 0 refills | Status: AC
Start: 2022-12-11 — End: 2022-12-16

## 2022-12-11 NOTE — Progress Notes (Signed)
Hershal Coria Martin,acting as a Neurosurgeon for Arnette Felts, FNP.,have documented all relevant documentation on the behalf of Arnette Felts, FNP,as directed by  Arnette Felts, FNP while in the presence of Arnette Felts, FNP.    Subjective:     Patient ID: [name] , female    DOB: 1976/07/02 , 47 y.o.   MRN: [id number]   Chief Complaint  Patient presents with   Obesity    HPI  Patient presents today for weight check. She is exercising 2-3 times a week at home and now does a work out IT consultant camp, that is at [employer]. She stopped taking phentermine one week ago. Noticed it was not working any more. She had dropped her weight down to 215 lbs. Patient reports not being able to sleep well  lately but she thinks its due to stress.   BP Readings from Last 3 Encounters: 12/11/22 : 130/70 10/07/22 : 122/78 08/07/22 : 128/68  Wt Readings from Last 3 Encounters: 12/11/22 : 221 lb 9.6 oz (100.5 kg) 10/07/22 : 224 lb (101.6 kg) 08/07/22 : 238 lb (108 kg)  She has been having urinary frequency and urgency for the last month. Denies dysuria.       Past Medical History:  Diagnosis Date   Acute bronchitis 09/11/2018   Acute calculous cholecystitis 09/11/2018   Acute dermatitis 09/11/2018   Acute maxillary sinusitis 09/11/2018   Acute urinary tract infection 09/11/2018   Anemia    Closed nondisplaced fracture of lesser toe of right foot with routine healing 10/12/2018   Herpes simplex of female genitalia 09/11/2018   Hyperlipidemia    patient denies   Hypertension    Mass    on rt inner thigh   Pneumonia of both lower lobes due to infectious organism 10/12/2018   Situational depression 02/15/2019   Trichomonal vaginitis 09/11/2018     Family History  Problem Relation Age of Onset   COPD Mother    Healthy Father      Current Outpatient Medications:    metFORMIN (GLUCOPHAGE) 500 MG tablet, TAKE 1 TABLET BY MOUTH 2 TIMES DAILY WITH A MEAL., Disp: 180 tablet, Rfl: 1   NUVARING  0.12-0.015 MG/24HR vaginal ring, Place 1 application vaginally every 30 (thirty) days., Disp: , Rfl: 11   olmesartan-hydrochlorothiazide (BENICAR HCT) 40-25 MG tablet, Take 1 tablet by mouth daily., Disp: 90 tablet, Rfl: 1   phentermine (ADIPEX-P) 37.5 MG tablet, Take 1 tablet (37.5 mg total) by mouth daily before breakfast., Disp: 30 tablet, Rfl: 1  Current Facility-Administered Medications:    triamcinolone acetonide (KENALOG-40) injection 40 mg, 40 mg, Intramuscular, Once, Arnette Felts, FNP   Allergies  Allergen Reactions   Hydrocodone-Acetaminophen Nausea And Vomiting     Review of Systems  Constitutional: Negative.   HENT:  Negative for congestion.   Respiratory: Negative.  Negative for apnea and cough.   Cardiovascular: Negative.  Negative for chest pain, palpitations and leg swelling.  Gastrointestinal: Negative.   Genitourinary:  Positive for frequency and urgency. Negative for difficulty urinating and flank pain.  Neurological: Negative.   Psychiatric/Behavioral: Negative.       Today's Vitals   12/11/22 1532  BP: 130/70  Pulse: 78  Temp: 98.1 F (36.7 C)  TempSrc: Oral  Weight: 221 lb 9.6 oz (100.5 kg)  Height: 5\' 3"  (1.6 m)  PainSc: 0-No pain   Body mass index is 39.25 kg/m.  Wt Readings from Last 3 Encounters:  12/11/22 221 lb 9.6 oz (100.5 kg)  10/07/22  224 lb (101.6 kg)  08/07/22 238 lb (108 kg)    Objective:  Physical Exam Vitals reviewed.  Constitutional:      General: She is not in acute distress.    Appearance: Normal appearance. She is obese.  HENT:     Right Ear: Tympanic membrane, ear canal and external ear normal. There is no impacted cerumen.     Left Ear: Tympanic membrane, ear canal and external ear normal. There is no impacted cerumen.     Nose: No congestion.     Mouth/Throat:     Mouth: Mucous membranes are moist.  Cardiovascular:     Rate and Rhythm: Normal rate and regular rhythm.     Pulses: Normal pulses.     Heart sounds:  Normal heart sounds. No murmur heard. Pulmonary:     Effort: Pulmonary effort is normal. No respiratory distress.     Breath sounds: Normal breath sounds. No wheezing.  Skin:    General: Skin is warm and dry.     Capillary Refill: Capillary refill takes less than 2 seconds.     Coloration: Skin is not jaundiced.     Findings: No bruising.     Comments: Right axilla has small raised papule with hair follicle.   Neurological:     General: No focal deficit present.     Mental Status: She is alert and oriented to person, place, and time.     Cranial Nerves: No cranial nerve deficit.     Motor: No weakness.  Psychiatric:        Mood and Affect: Mood normal.        Behavior: Behavior normal.        Thought Content: Thought content normal.        Judgment: Judgment normal.         Assessment And Plan:     1. Frequent urination Comments: Due to symptoms will treat with nitrofuratoin, will send urine culture - POCT Urinalysis Dipstick (81002) - Culture, Urine - nitrofurantoin, macrocrystal-monohydrate, (MACROBID) 100 MG capsule; Take 1 capsule (100 mg total) by mouth 2 (two) times daily for 5 days.  Dispense: 10 capsule; Refill: 0  2. Other insomnia Comments: Likely related to stress, if worsens aware to notify offie  3. Folliculitis Comments: right axilla with small raised papule with hair follicle, encouraged to apply warm compress  4. Class 2 severe obesity due to excess calories with serious comorbidity and body mass index (BMI) of 39.0 to 39.9 in adult [facility]) Comments: Continue phentermine 37.5 mg daily, overall doing well. - phentermine (ADIPEX-P) 37.5 MG tablet; Take 1 tablet (37.5 mg total) by mouth daily before breakfast.  Dispense: 30 tablet; Refill: 1    Patient was given opportunity to ask questions. Patient verbalized understanding of the plan and was able to repeat key elements of the plan. All questions were answered to their satisfaction.  Arnette Felts, FNP   I,  Arnette Felts, FNP, have reviewed all documentation for this visit. The documentation on 12/25/22 for the exam, diagnosis, procedures, and orders are all accurate and complete.   IF YOU HAVE BEEN REFERRED TO A SPECIALIST, IT MAY TAKE 1-2 WEEKS TO SCHEDULE/PROCESS THE REFERRAL. IF YOU HAVE NOT HEARD FROM US/SPECIALIST IN TWO WEEKS, PLEASE GIVE [place] A CALL AT [phone].   THE PATIENT IS ENCOURAGED TO PRACTICE SOCIAL DISTANCING DUE TO THE COVID-19 PANDEMIC.

## 2022-12-11 NOTE — Patient Instructions (Signed)
Obesity, Adult ?Obesity is having too much body fat. Being obese means that your weight is more than what is healthy for you.  ?BMI (body mass index) is a number that explains how much body fat you have. If you have a BMI of 30 or more, you are obese. ?Obesity can cause serious health problems, such as: ?Stroke. ?Coronary artery disease (CAD). ?Type 2 diabetes. ?Some types of cancer. ?High blood pressure (hypertension). ?High cholesterol. ?Gallbladder stones. ?Obesity can also contribute to: ?Osteoarthritis. ?Sleep apnea. ?Infertility problems. ?What are the causes? ?Eating meals each day that are high in calories, sugar, and fat. ?Drinking a lot of drinks that have sugar in them. ?Being born with genes that may make you more likely to become obese. ?Having a medical condition that causes obesity. ?Taking certain medicines. ?Sitting a lot (having a sedentary lifestyle). ?Not getting enough sleep. ?What increases the risk? ?Having a family history of obesity. ?Living in an area with limited access to: ?Parks, recreation centers, or sidewalks. ?Healthy food choices, such as grocery stores and farmers' markets. ?What are the signs or symptoms? ?The main sign is having too much body fat. ?How is this treated? ?Treatment for this condition often includes changing your lifestyle. Treatment may include: ?Changing your diet. This may include making a healthy meal plan. ?Exercise. This may include activity that causes your heart to beat faster (aerobic exercise) and strength training. Work with your doctor to design a program that works for you. ?Medicine to help you lose weight. This may be used if you are not able to lose one pound a week after 6 weeks of healthy eating and more exercise. ?Treating conditions that cause the obesity. ?Surgery. Options may include gastric banding and gastric bypass. This may be done if: ?Other treatments have not helped to improve your condition. ?You have a BMI of 40 or higher. ?You have  life-threatening health problems related to obesity. ?Follow these instructions at home: ?Eating and drinking ? ?Follow advice from your doctor about what to eat and drink. Your doctor may tell you to: ?Limit fast food, sweets, and processed snack foods. ?Choose low-fat options. For example, choose low-fat milk instead of whole milk. ?Eat five or more servings of fruits or vegetables each day. ?Eat at home more often. This gives you more control over what you eat. ?Choose healthy foods when you eat out. ?Learn to read food labels. This will help you learn how much food is in one serving. ?Keep low-fat snacks available. ?Avoid drinks that have a lot of sugar in them. These include soda, fruit juice, iced tea with sugar, and flavored milk. ?Drink enough water to keep your pee (urine) pale yellow. ?Do not go on fad diets. ?Physical activity ?Exercise often, as told by your doctor. Most adults should get up to 150 minutes of moderate-intensity exercise every week.Ask your doctor: ?What types of exercise are safe for you. ?How often you should exercise. ?Warm up and stretch before being active. ?Do slow stretching after being active (cool down). ?Rest between times of being active. ?Lifestyle ?Work with your doctor and a food expert (dietitian) to set a weight-loss goal that is best for you. ?Limit your screen time. ?Find ways to reward yourself that do not involve food. ?Do not drink alcohol if: ?Your doctor tells you not to drink. ?You are pregnant, may be pregnant, or are planning to become pregnant. ?If you drink alcohol: ?Limit how much you have to: ?0-1 drink a day for women. ?0-2 drinks   a day for men. ?Know how much alcohol is in your drink. In the [place] one drink equals one 12 oz bottle of beer (355 mL), one 5 oz glass of wine (148 mL), or one 1? oz glass of hard liquor (44 mL). ?General instructions ?Keep a weight-loss journal. This can help you keep track of: ?The food that you eat. ?How much exercise you  get. ?Take over-the-counter and prescription medicines only as told by your doctor. ?Take vitamins and supplements only as told by your doctor. ?Think about joining a support group. ?Pay attention to your mental health as obesity can lead to depression or self esteem issues. ?Keep all follow-up visits. ?Contact a doctor if: ?You cannot meet your weight-loss goal after you have changed your diet and lifestyle for 6 weeks. ?You are having trouble breathing. ?Summary ?Obesity is having too much body fat. ?Being obese means that your weight is more than what is healthy for you. ?Work with your doctor to set a weight-loss goal. ?Get regular exercise as told by your doctor. ?This information is not intended to replace advice given to you by your health care provider. Make sure you discuss any questions you have with your health care provider. ?Document Revised: 02/27/2021 Document Reviewed: 02/27/2021 ?Elsevier Patient Education ? 2023 Elsevier Inc. ? ?

## 2022-12-13 LAB — URINE CULTURE

## 2023-01-21 ENCOUNTER — Encounter: Payer: Self-pay | Admitting: Nurse Practitioner

## 2023-02-10 ENCOUNTER — Ambulatory Visit (INDEPENDENT_AMBULATORY_CARE_PROVIDER_SITE_OTHER): Payer: BC Managed Care – PPO | Admitting: Nurse Practitioner

## 2023-02-10 ENCOUNTER — Encounter: Payer: Self-pay | Admitting: Nurse Practitioner

## 2023-02-10 VITALS — BP 120/70 | HR 85 | Temp 98.0°F | Ht 63.0 in | Wt 225.2 lb

## 2023-02-10 DIAGNOSIS — I10 Essential (primary) hypertension: Secondary | ICD-10-CM

## 2023-02-10 DIAGNOSIS — Z6839 Body mass index (BMI) 39.0-39.9, adult: Secondary | ICD-10-CM

## 2023-02-10 DIAGNOSIS — R7303 Prediabetes: Secondary | ICD-10-CM | POA: Diagnosis not present

## 2023-02-10 MED ORDER — METFORMIN HCL 1000 MG PO TABS: 1000.0000 mg | ORAL_TABLET | Freq: Every day | ORAL | 1 refills | Status: DC

## 2023-02-10 NOTE — Progress Notes (Signed)
Madelaine Bhat, CMA,acting as a Neurosurgeon for Arnette Felts, FNP.,have documented all relevant documentation on the behalf of Arnette Felts, FNP,as directed by  Arnette Felts, FNP while in the presence of Arnette Felts, FNP.  Subjective:  Patient ID: [name] , female    DOB: [birth year] , 47 y.o.   MRN: [id number]  Chief Complaint  Patient presents with   Hypertension   Obesity    HPI  Patient presents today for a weight, and bp check. Patient reports compliance with medication. Patient denies any chest pain, SOB, and headaches. Patient reports her phentermine is not working for her, she is taking it consistently and she is still working out. She is not interested in going to a weight loss clinic. She has been eating more bad foods in the last few weeks and feels this can be related to her poor weight loss. She has been doing [employer]   She needs her FMLA form updated from before.   BP Readings from Last 3 Encounters: 02/10/23 : 120/70 12/11/22 : 130/70 10/07/22 : 122/78      Wt Readings from Last 3 Encounters: 02/10/23 : 225 lb 3.2 oz (102.2 kg) 12/11/22 : 221 lb 9.6 oz (100.5 kg) 10/07/22 : 224 lb (101.6 kg)       Past Medical History:  Diagnosis Date   Acute bronchitis 09/11/2018   Acute calculous cholecystitis 09/11/2018   Acute dermatitis 09/11/2018   Acute maxillary sinusitis 09/11/2018   Acute urinary tract infection 09/11/2018   Anemia    Closed nondisplaced fracture of lesser toe of right foot with routine healing 10/12/2018   Frequent urination 12/11/2022   Herpes simplex of female genitalia 09/11/2018   Hyperlipidemia    patient denies   Hypertension    Mass    on rt inner thigh   Pneumonia of both lower lobes due to infectious organism 10/12/2018   S/P myomectomy 07/20/2015   Situational depression 02/15/2019   Trichomonal vaginitis 09/11/2018     Family History  Problem Relation Age of Onset   COPD Mother    Healthy Father      Current  Outpatient Medications:    NUVARING 0.12-0.015 MG/24HR vaginal ring, Place 1 application vaginally every 30 (thirty) days., Disp: , Rfl: 11   olmesartan-hydrochlorothiazide (BENICAR HCT) 40-25 MG tablet, Take 1 tablet by mouth daily., Disp: 90 tablet, Rfl: 1   phentermine (ADIPEX-P) 37.5 MG tablet, Take 1 tablet (37.5 mg total) by mouth daily before breakfast., Disp: 30 tablet, Rfl: 1   metFORMIN (GLUCOPHAGE) 1000 MG tablet, Take 1 tablet (1,000 mg total) by mouth daily with breakfast., Disp: 90 tablet, Rfl: 1  Current Facility-Administered Medications:    triamcinolone acetonide (KENALOG-40) injection 40 mg, 40 mg, Intramuscular, Once, Arnette Felts, FNP   Allergies  Allergen Reactions   Hydrocodone-Acetaminophen Nausea And Vomiting     Review of Systems  Constitutional: Negative.   HENT:  Negative for congestion.   Respiratory:  Negative for cough.   Cardiovascular: Negative.   Gastrointestinal: Negative.   Genitourinary: Negative.   Neurological: Negative.   Psychiatric/Behavioral: Negative.       Today's Vitals   02/10/23 1618  BP: 120/70  Pulse: 85  Temp: 98 F (36.7 C)  TempSrc: Oral  SpO2: 96%  Weight: 225 lb 3.2 oz (102.2 kg)  Height: 5\' 3"  (1.6 m)  PainSc: 0-No pain   Body mass index is 39.89 kg/m.  Wt Readings from Last 3 Encounters:  02/10/23 225 lb 3.2 oz (  102.2 kg)  12/11/22 221 lb 9.6 oz (100.5 kg)  10/07/22 224 lb (101.6 kg)      Objective:  Physical Exam Vitals reviewed.  Constitutional:      General: She is not in acute distress.    Appearance: Normal appearance. She is obese.  HENT:     Right Ear: Tympanic membrane, ear canal and external ear normal. There is no impacted cerumen.     Left Ear: Tympanic membrane, ear canal and external ear normal. There is no impacted cerumen.  Cardiovascular:     Rate and Rhythm: Normal rate and regular rhythm.     Pulses: Normal pulses.     Heart sounds: Normal heart sounds. No murmur heard. Pulmonary:      Effort: Pulmonary effort is normal. No respiratory distress.     Breath sounds: Normal breath sounds. No wheezing.  Skin:    General: Skin is warm and dry.     Capillary Refill: Capillary refill takes less than 2 seconds.     Coloration: Skin is not jaundiced.     Findings: No bruising.  Neurological:     General: No focal deficit present.     Mental Status: She is alert and oriented to person, place, and time.     Cranial Nerves: No cranial nerve deficit.     Motor: No weakness.  Psychiatric:        Mood and Affect: Mood normal.        Behavior: Behavior normal.        Thought Content: Thought content normal.        Judgment: Judgment normal.         Assessment And Plan:  Essential hypertension Assessment & Plan: Blood pressure is well controlled, continue current medications   Prediabetes Assessment & Plan: Stable, continue limiting intake of sugary foods and drinks  Orders: -     metFORMIN HCl; Take 1 tablet (1,000 mg total) by mouth daily with breakfast.  Dispense: 90 tablet; Refill: 1  Class 2 severe obesity due to excess calories with serious comorbidity and body mass index (BMI) of 39.0 to 39.9 in adult [facility]) Assessment & Plan: She is encouraged to strive for BMI less than 30 to decrease cardiac risk. Advised to aim for at least 150 minutes of exercise per week. Weight has increased slightly feels she has not been eating as healthy as before. Encouraged to get back on track.      No follow-ups on file.  Patient was given opportunity to ask questions. Patient verbalized understanding of the plan and was able to repeat key elements of the plan. All questions were answered to their satisfaction.    Jeanell Sparrow, FNP, have reviewed all documentation for this visit. The documentation on 02/10/23 for the exam, diagnosis, procedures, and orders are all accurate and complete.   IF YOU HAVE BEEN REFERRED TO A SPECIALIST, IT MAY TAKE 1-2 WEEKS TO SCHEDULE/PROCESS THE  REFERRAL. IF YOU HAVE NOT HEARD FROM US/SPECIALIST IN TWO WEEKS, PLEASE GIVE [place] A CALL AT [phone].

## 2023-03-01 ENCOUNTER — Encounter: Payer: Self-pay | Admitting: Nurse Practitioner

## 2023-03-01 NOTE — Assessment & Plan Note (Signed)
Stable, continue limiting intake of sugary foods and drinks

## 2023-03-01 NOTE — Assessment & Plan Note (Signed)
Blood pressure is well controlled, continue current medications.

## 2023-03-01 NOTE — Assessment & Plan Note (Signed)
She is encouraged to strive for BMI less than 30 to decrease cardiac risk. Advised to aim for at least 150 minutes of exercise per week. Weight has increased slightly feels she has not been eating as healthy as before. Encouraged to get back on track.

## 2023-03-14 ENCOUNTER — Other Ambulatory Visit: Payer: Self-pay | Admitting: Nurse Practitioner

## 2023-03-14 DIAGNOSIS — I10 Essential (primary) hypertension: Secondary | ICD-10-CM

## 2023-03-19 ENCOUNTER — Other Ambulatory Visit: Payer: Self-pay | Admitting: Nurse Practitioner

## 2023-03-19 MED ORDER — PHENTERMINE HCL 37.5 MG PO TABS: 37.5000 mg | ORAL_TABLET | Freq: Every day | ORAL | 1 refills | Status: DC

## 2023-03-19 MED ORDER — PHENTERMINE HCL 37.5 MG PO TABS
37.5000 mg | ORAL_TABLET | Freq: Every day | ORAL | 1 refills | Status: DC
Start: 2023-03-19 — End: 2023-03-19

## 2023-04-27 ENCOUNTER — Encounter: Payer: Self-pay | Admitting: Nurse Practitioner

## 2023-04-28 ENCOUNTER — Other Ambulatory Visit: Payer: Self-pay | Admitting: Obstetrics and Gynecology

## 2023-04-28 ENCOUNTER — Ambulatory Visit
Admission: EM | Admit: 2023-04-28 | Discharge: 2023-04-28 | Disposition: A | Discharge status: Home / Self Care | Payer: BC Managed Care – PPO | Attending: Internal Medicine | Admitting: Internal Medicine

## 2023-04-28 DIAGNOSIS — J209 Acute bronchitis, unspecified: Secondary | ICD-10-CM | POA: Diagnosis not present

## 2023-04-28 DIAGNOSIS — B349 Viral infection, unspecified: Secondary | ICD-10-CM

## 2023-04-28 DIAGNOSIS — R070 Pain in throat: Secondary | ICD-10-CM

## 2023-04-28 DIAGNOSIS — Z0000 Encounter for general adult medical examination without abnormal findings: Secondary | ICD-10-CM

## 2023-04-28 LAB — POCT RAPID STREP A (OFFICE): Rapid Strep A Screen: NEGATIVE

## 2023-04-28 MED ORDER — CETIRIZINE HCL 10 MG PO TABS: 10.0000 mg | ORAL_TABLET | Freq: Every day | ORAL | 0 refills | Status: DC

## 2023-04-28 MED ORDER — PSEUDOEPHEDRINE HCL 30 MG PO TABS: 30.0000 mg | ORAL_TABLET | Freq: Three times a day (TID) | ORAL | 0 refills | Status: DC | PRN

## 2023-04-28 MED ORDER — DEXAMETHASONE SODIUM PHOSPHATE 10 MG/ML IJ SOLN
10.0000 mg | Freq: Once | INTRAMUSCULAR | Status: AC
  Administered 2023-04-28: 10 mg via INTRAMUSCULAR

## 2023-04-28 MED ORDER — PROMETHAZINE-DM 6.25-15 MG/5ML PO SYRP: 5.0000 mL | ORAL_SOLUTION | Freq: Three times a day (TID) | ORAL | 0 refills | Status: DC | PRN

## 2023-04-28 NOTE — ED Triage Notes (Signed)
Pt reports sore throat, headache, fever and dry cough x 4 days. States she van sleep due to cough.

## 2023-04-28 NOTE — Discharge Instructions (Addendum)
We will notify you of your test results as they arrive and may take between about 24 hours.  I encourage you to sign up for MyChart if you have not already done so as this can be the easiest way for [place] to communicate results to you online or through a phone app.  Generally, we only contact you if it is a positive test result.  In the meantime, if you develop worsening symptoms including fever, chest pain, shortness of breath despite our current treatment plan then please report to the emergency room as this may be a sign of worsening status from possible viral infection.  Otherwise, we will manage this as a viral bronchitis, viral syndrome. For sore throat or cough try using a honey-based tea. Use 3 teaspoons of honey with juice squeezed from half lemon. Place shaved pieces of ginger into 1/2-1 cup of water and warm over stove top. Then mix the ingredients and repeat every 4 hours as needed. Please take Tylenol 650mg  every 6 hours for aches and pains, fevers. Hydrate very well with at least 2 liters of water. Eat light meals such as soups to replenish electrolytes and soft fruits, veggies. Start an antihistamine like Zyrtec (10mg  daily) for postnasal drainage, sinus congestion.  You can take this together with cough medications as needed.

## 2023-04-28 NOTE — ED Provider Notes (Signed)
[facility]  Note:  This document was prepared using Conservation officer, historic buildings and may include unintentional dictation errors.  MRN: [id number] DOB: [birth year]  Subjective:   [name] is a 47 y.o. female presenting for 4-day history of acute onset throat pain, headaches, fever, dry cough.  Has had significant difficulty sleeping from her coughing, coughing fits.  Now she is having chest pain when she does cough.  Patient has a history of bronchitis, no asthma.  Takes metformin.  Does not want to do oral prednisone, prefers an injection.  No smoking of any kind including cigarettes, cigars, vaping, marijuana use.     Current Facility-Administered Medications:    triamcinolone acetonide (KENALOG-40) injection 40 mg, 40 mg, Intramuscular, Once, Arnette Felts, FNP  Current Outpatient Medications:    metFORMIN (GLUCOPHAGE) 1000 MG tablet, Take 1 tablet (1,000 mg total) by mouth daily with breakfast., Disp: 90 tablet, Rfl: 1   NUVARING 0.12-0.015 MG/24HR vaginal ring, Place 1 application vaginally every 30 (thirty) days., Disp: , Rfl: 11   olmesartan-hydrochlorothiazide (BENICAR HCT) 40-25 MG tablet, TAKE 1 TABLET BY MOUTH EVERY DAY, Disp: 90 tablet, Rfl: 1   phentermine (ADIPEX-P) 37.5 MG tablet, Take 1 tablet (37.5 mg total) by mouth daily before breakfast., Disp: 30 tablet, Rfl: 1   Allergies  Allergen Reactions   Hydrocodone-Acetaminophen Nausea And Vomiting    Past Medical History:  Diagnosis Date   Acute bronchitis 09/11/2018   Acute calculous cholecystitis 09/11/2018   Acute dermatitis 09/11/2018   Acute maxillary sinusitis 09/11/2018   Acute urinary tract infection 09/11/2018   Anemia    Closed nondisplaced fracture of lesser toe of right foot with routine healing 10/12/2018   Frequent urination 12/11/2022   Herpes simplex of female genitalia 09/11/2018   Hyperlipidemia    patient denies   Hypertension    Mass    on rt inner thigh    Pneumonia of both lower lobes due to infectious organism 10/12/2018   S/P myomectomy 07/20/2015   Situational depression 02/15/2019   Trichomonal vaginitis 09/11/2018     Past Surgical History:  Procedure Laterality Date   BREAST EXCISIONAL BIOPSY Left 2003   papiloma removed   BREAST EXCISIONAL BIOPSY Right 09/08/2020   papilloma   BREAST LUMPECTOMY WITH RADIOACTIVE SEED LOCALIZATION Right 09/08/2020   Procedure: RIGHT BREAST LUMPECTOMY  X 3 WITH RADIOACTIVE SEED LOCALIZATION;  Surgeon: Abigail Miyamoto, MD;  Location: [facility];  Service: General;  Laterality: Right;  LMA   BREAST SURGERY     left breast papilloma   CESAREAN SECTION  07/11/2003   LAPAROSCOPIC CHOLECYSTECTOMY SINGLE SITE WITH INTRAOPERATIVE CHOLANGIOGRAM N/A 09/11/2018   Procedure: LAPAROSCOPIC CHOLECYSTECTOMY SINGLE SITE WITH INTRAOPERATIVE CHOLANGIOGRAM;  Surgeon: Karie Soda, MD;  Location: [facility];  Service: General;  Laterality: N/A;   MASS EXCISION  05/21/2005   inner lt thigh   MYOMECTOMY N/A 07/20/2015   Procedure: MYOMECTOMY, ABDOMINAL;  Surgeon: Richardean Chimera, MD;  Location: [facility];  Service: Gynecology;  Laterality: N/A;   WISDOM TOOTH EXTRACTION      Family History  Problem Relation Age of Onset   COPD Mother    Healthy Father     Social History   Tobacco Use   Smoking status: Never   Smokeless tobacco: Never  Vaping Use   Vaping status: Never Used  Substance Use Topics   Alcohol use: No   Drug use: No    ROS   Objective:   Vitals: BP Marland Kitchen)  151/95 (BP Location: Left Arm)   Pulse 100   Temp 99.1 F (37.3 C) (Oral)   Resp 18   SpO2 95%   Physical Exam Constitutional:      General: She is not in acute distress.    Appearance: Normal appearance. She is well-developed and normal weight. She is not ill-appearing, toxic-appearing or diaphoretic.  HENT:     Head: Normocephalic and atraumatic.     Right Ear: Tympanic membrane, ear canal and external ear normal. No  drainage or tenderness. No middle ear effusion. There is no impacted cerumen. Tympanic membrane is not erythematous or bulging.     Left Ear: Tympanic membrane, ear canal and external ear normal. No drainage or tenderness.  No middle ear effusion. There is no impacted cerumen. Tympanic membrane is not erythematous or bulging.     Nose: Nose normal. No congestion or rhinorrhea.     Mouth/Throat:     Mouth: Mucous membranes are moist. No oral lesions.     Pharynx: No pharyngeal swelling, oropharyngeal exudate, posterior oropharyngeal erythema or uvula swelling.     Tonsils: No tonsillar exudate or tonsillar abscesses.  Eyes:     General: No scleral icterus.       Right eye: No discharge.        Left eye: No discharge.     Extraocular Movements: Extraocular movements intact.     Right eye: Normal extraocular motion.     Left eye: Normal extraocular motion.     Conjunctiva/sclera: Conjunctivae normal.  Cardiovascular:     Rate and Rhythm: Normal rate and regular rhythm.     Heart sounds: Normal heart sounds. No murmur heard.    No friction rub. No gallop.  Pulmonary:     Effort: Pulmonary effort is normal. No respiratory distress.     Breath sounds: No stridor. No wheezing, rhonchi or rales.  Chest:     Chest wall: No tenderness.  Musculoskeletal:     Cervical back: Normal range of motion and neck supple.  Lymphadenopathy:     Cervical: No cervical adenopathy.  Skin:    General: Skin is warm and dry.  Neurological:     General: No focal deficit present.     Mental Status: She is alert and oriented to person, place, and time.     Cranial Nerves: No cranial nerve deficit.     Motor: No weakness.     Coordination: Coordination normal.     Gait: Gait normal.  Psychiatric:        Mood and Affect: Mood normal.        Behavior: Behavior normal.     Results for orders placed or performed during the hospital encounter of 04/28/23 (from the past 24 hour(s))  POCT rapid strep A      Status: None   Collection Time: 04/28/23 11:26 AM  Result Value Ref Range   Rapid Strep A Screen Negative Negative    Assessment and Plan :   PDMP not reviewed this encounter.  1. Acute bronchitis, unspecified organism   2. Throat pain   3. Acute viral syndrome    Patient declined imaging.  She also declined COVID testing and changed her mind about strep culture.  Given her respiratory symptoms, bronchitis recommended steroid use and she was agreeable to IM dexamethasone 10 mg which was administered in clinic.  Use supportive care otherwise.  Counseled patient on potential for adverse effects with medications prescribed/recommended today, ER and return-to-clinic precautions discussed, patient verbalized  understanding.    Wallis Bamberg, PA-C 04/28/23 1250

## 2023-05-29 ENCOUNTER — Ambulatory Visit: Payer: BC Managed Care – PPO | Admitting: Nurse Practitioner

## 2023-05-29 ENCOUNTER — Encounter: Payer: Self-pay | Admitting: Nurse Practitioner

## 2023-05-29 VITALS — BP 120/70 | HR 96 | Temp 98.5°F | Ht 63.0 in | Wt 219.0 lb

## 2023-05-29 DIAGNOSIS — Z0000 Encounter for general adult medical examination without abnormal findings: Secondary | ICD-10-CM | POA: Insufficient documentation

## 2023-05-29 DIAGNOSIS — Z6838 Body mass index (BMI) 38.0-38.9, adult: Secondary | ICD-10-CM

## 2023-05-29 DIAGNOSIS — R7303 Prediabetes: Secondary | ICD-10-CM

## 2023-05-29 DIAGNOSIS — E785 Hyperlipidemia, unspecified: Secondary | ICD-10-CM | POA: Diagnosis not present

## 2023-05-29 DIAGNOSIS — I10 Essential (primary) hypertension: Secondary | ICD-10-CM

## 2023-05-29 DIAGNOSIS — Z23 Encounter for immunization: Secondary | ICD-10-CM | POA: Diagnosis not present

## 2023-05-29 DIAGNOSIS — E6609 Other obesity due to excess calories: Secondary | ICD-10-CM

## 2023-05-29 DIAGNOSIS — Z2821 Immunization not carried out because of patient refusal: Secondary | ICD-10-CM

## 2023-05-29 DIAGNOSIS — Z79899 Other long term (current) drug therapy: Secondary | ICD-10-CM

## 2023-05-29 DIAGNOSIS — E66812 Obesity, class 2: Secondary | ICD-10-CM

## 2023-05-29 LAB — POCT URINALYSIS DIP (CLINITEK)
Bilirubin, UA: NEGATIVE
Glucose, UA: NEGATIVE mg/dL
Nitrite, UA: NEGATIVE
POC PROTEIN,UA: NEGATIVE
Spec Grav, UA: 1.02 (ref 1.010–1.025)
Urobilinogen, UA: 1 U/dL
pH, UA: 7 (ref 5.0–8.0)

## 2023-05-29 NOTE — Progress Notes (Signed)
Madelaine Bhat, CMA,acting as a Neurosurgeon for Arnette Felts, FNP.,have documented all relevant documentation on the behalf of Arnette Felts, FNP,as directed by  Arnette Felts, FNP while in the presence of Arnette Felts, FNP.  Subjective:    Patient ID: [name] , female    DOB: 11/24/1975 , 47 y.o.   MRN: [id number]  Chief Complaint  Patient presents with   Annual Exam    HPI  Patient presents today for HM, Patient reports compliance with medication. Patient denies any chest pain, SOB, or headaches. Patient recently had bronchitis and reports her cough isn't gone. She has been out of phentermine 37.5mg  for about a week.   BP Readings from Last 3 Encounters: 05/29/23 : 120/70 04/28/23 : (!) 151/95 02/10/23 : 120/70   Wt Readings from Last 3 Encounters: 05/29/23 : 219 lb (99.3 kg) 02/10/23 : 225 lb 3.2 oz (102.2 kg) 12/11/22 : 221 lb 9.6 oz (100.5 kg)      Past Medical History:  Diagnosis Date   Acute bronchitis 09/11/2018   Acute calculous cholecystitis 09/11/2018   Acute dermatitis 09/11/2018   Acute maxillary sinusitis 09/11/2018   Acute urinary tract infection 09/11/2018   Anemia    Closed nondisplaced fracture of lesser toe of right foot with routine healing 10/12/2018   Frequent urination 12/11/2022   Herpes simplex of female genitalia 09/11/2018   Hyperlipidemia    patient denies   Hypertension    Mass    on rt inner thigh   Pneumonia of both lower lobes due to infectious organism 10/12/2018   Right upper quadrant abdominal pain 09/18/2018   S/P myomectomy 07/20/2015   Situational depression 02/15/2019   Trichomonal vaginitis 09/11/2018     Family History  Problem Relation Age of Onset   COPD Mother    Healthy Father      Current Outpatient Medications:    cetirizine (ZYRTEC ALLERGY) 10 MG tablet, Take 1 tablet (10 mg total) by mouth daily., Disp: 30 tablet, Rfl: 0   metFORMIN (GLUCOPHAGE) 1000 MG tablet, Take 1 tablet (1,000 mg total) by mouth daily  with breakfast., Disp: 90 tablet, Rfl: 1   NUVARING 0.12-0.015 MG/24HR vaginal ring, Place 1 application vaginally every 30 (thirty) days., Disp: , Rfl: 11   olmesartan-hydrochlorothiazide (BENICAR HCT) 40-25 MG tablet, TAKE 1 TABLET BY MOUTH EVERY DAY, Disp: 90 tablet, Rfl: 1   phentermine (ADIPEX-P) 37.5 MG tablet, Take 1 tablet (37.5 mg total) by mouth daily before breakfast., Disp: 30 tablet, Rfl: 1   promethazine-dextromethorphan (PROMETHAZINE-DM) 6.25-15 MG/5ML syrup, Take 5 mLs by mouth 3 (three) times daily as needed for cough. (Patient not taking: Reported on 05/29/2023), Disp: 200 mL, Rfl: 0  Current Facility-Administered Medications:    triamcinolone acetonide (KENALOG-40) injection 40 mg, 40 mg, Intramuscular, Once, Arnette Felts, FNP   Allergies  Allergen Reactions   Hydrocodone-Acetaminophen Nausea And Vomiting      The patient states she uses NuvaRing vaginal inserts for birth control. No LMP recorded.  She has been having irregular menstrual cycles.  Negative for Dysmenorrhea and Negative for Menorrhagia. Negative for: breast discharge, breast lump(s), breast pain and breast self exam. Associated symptoms include abnormal vaginal bleeding. Pertinent negatives include abnormal bleeding (hematology), anxiety, decreased libido, depression, difficulty falling sleep, dyspareunia, history of infertility, nocturia, sexual dysfunction, sleep disturbances, urinary incontinence, urinary urgency, vaginal discharge and vaginal itching. Diet regular.The patient states her exercise level is minimal to moderate - she had been out for about 2 weeks. She has been  walking and knows she needs to do more.    . The patient's tobacco use is:  Social History   Tobacco Use  Smoking Status Never  Smokeless Tobacco Never  . She has been exposed to passive smoke. The patient's alcohol use is:  Social History   Substance and Sexual Activity  Alcohol Use No   Additional information: Last pap  04/30/2022 reports had one this year, next one scheduled for 04/30/24.    Review of Systems  Constitutional: Negative.   HENT: Negative.  Negative for congestion.   Eyes: Negative.   Respiratory:  Positive for cough.   Cardiovascular: Negative.   Gastrointestinal: Negative.   Endocrine: Negative.   Genitourinary: Negative.   Musculoskeletal: Negative.   Skin: Negative.   Allergic/Immunologic: Negative.   Neurological: Negative.   Hematological: Negative.   Psychiatric/Behavioral: Negative.       Today's Vitals   05/29/23 1528  BP: 120/70  Pulse: 96  Temp: 98.5 F (36.9 C)  TempSrc: Oral  Weight: 219 lb (99.3 kg)  Height: 5\' 3"  (1.6 m)  PainSc: 0-No pain   Body mass index is 38.79 kg/m.  Wt Readings from Last 3 Encounters:  05/29/23 219 lb (99.3 kg)  02/10/23 225 lb 3.2 oz (102.2 kg)  12/11/22 221 lb 9.6 oz (100.5 kg)     Objective:  Physical Exam Vitals reviewed.  Constitutional:      General: She is not in acute distress.    Appearance: Normal appearance. She is well-developed. She is obese.  HENT:     Head: Normocephalic and atraumatic.     Right Ear: Hearing, tympanic membrane, ear canal and external ear normal. There is no impacted cerumen.     Left Ear: Hearing, tympanic membrane, ear canal and external ear normal. There is no impacted cerumen.     Nose: Nose normal.     Mouth/Throat:     Mouth: Mucous membranes are moist.  Eyes:     General: Lids are normal.     Extraocular Movements: Extraocular movements intact.     Conjunctiva/sclera: Conjunctivae normal.     Pupils: Pupils are equal, round, and reactive to light.     Funduscopic exam:    Right eye: No papilledema.        Left eye: No papilledema.  Neck:     Thyroid: No thyroid mass.     Vascular: No carotid bruit.  Cardiovascular:     Rate and Rhythm: Normal rate and regular rhythm.     Pulses: Normal pulses.     Heart sounds: Normal heart sounds. No murmur heard. Pulmonary:     Effort:  Pulmonary effort is normal. No respiratory distress.     Breath sounds: Normal breath sounds. No wheezing.  Abdominal:     General: Abdomen is flat. Bowel sounds are normal. There is no distension.     Palpations: Abdomen is soft.     Tenderness: There is no abdominal tenderness.  Genitourinary:    Comments: Followed by GYN Musculoskeletal:        General: No swelling or tenderness. Normal range of motion.     Cervical back: Full passive range of motion without pain, normal range of motion and neck supple.     Right lower leg: No edema.     Left lower leg: No edema.  Skin:    General: Skin is warm and dry.     Capillary Refill: Capillary refill takes less than 2 seconds.  Neurological:  General: No focal deficit present.     Mental Status: She is alert and oriented to person, place, and time.     Cranial Nerves: No cranial nerve deficit.     Sensory: No sensory deficit.  Psychiatric:        Mood and Affect: Mood normal.        Behavior: Behavior normal.        Thought Content: Thought content normal.        Judgment: Judgment normal.         Assessment And Plan:     Encounter for annual health examination Assessment & Plan: Behavior modifications discussed and diet history reviewed.   Pt will continue to exercise regularly and modify diet with low GI, plant based foods and decrease intake of processed foods.  Recommend intake of daily multivitamin, Vitamin D, and calcium.  Recommend mammogram and colonoscopy for preventive screenings, as well as recommend immunizations that include influenza, TDAP    Essential hypertension Assessment & Plan: Blood pressure is well controlled, continue current medications. EKG done with NSR HR 86  Orders: -     EKG 12-Lead -     POCT URINALYSIS DIP (CLINITEK) -     Microalbumin / creatinine urine ratio -     CMP14+EGFR  Prediabetes Assessment & Plan: Hgba1c remains stable. Continue current medications. Encouraged to focus on  diet and regular exercise.  Orders: -     Hemoglobin A1c  Elevated lipids Assessment & Plan: Will check lipid panel. No current medications.   Orders: -     Lipid panel  Need for influenza vaccination Assessment & Plan: Influenza vaccine administered Encouraged to take Tylenol as needed for fever or muscle aches.   Orders: -     Flu vaccine trivalent PF, 6mos and older(Flulaval,Afluria,Fluarix,Fluzone)  COVID-19 vaccination declined Assessment & Plan: Declines covid 19 vaccine. Discussed risk of covid 55 and if she changes her mind about the vaccine to call the office. Education has been provided regarding the importance of this vaccine but patient still declined. Advised may receive this vaccine at local pharmacy or Health Dept.or vaccine clinic. Aware to provide a copy of the vaccination record if obtained from local pharmacy or Health Dept.  Encouraged to take multivitamin, vitamin d, vitamin c and zinc to increase immune system. Aware can call office if would like to have vaccine here at office. Verbalized acceptance and understanding.     Class 2 obesity due to excess calories with body mass index (BMI) of 38.0 to 38.9 in adult, unspecified whether serious comorbidity present Assessment & Plan: She is encouraged to strive for BMI less than 30 to decrease cardiac risk. Advised to aim for at least 150 minutes of exercise per week. Continue doing well with your exercise. Will refill her phentermine, this will likely be the last refill   Other long term (current) drug therapy -     CBC with Differential/Platelet -     TSH    Return for 1 year physical, 6 month bp check.  Patient was given opportunity to ask questions. Patient verbalized understanding of the plan and was able to repeat key elements of the plan. All questions were answered to their satisfaction.   Arnette Felts, FNP  I, Arnette Felts, FNP, have reviewed all documentation for this visit. The documentation on  05/29/23 for the exam, diagnosis, procedures, and orders are all accurate and complete.

## 2023-05-30 LAB — CMP14+EGFR
ALT: 13 [IU]/L (ref 0–32)
AST: 11 [IU]/L (ref 0–40)
Albumin: 3.8 g/dL — ABNORMAL LOW (ref 3.9–4.9)
Alkaline Phosphatase: 90 [IU]/L (ref 44–121)
BUN/Creatinine Ratio: 13 (ref 9–23)
BUN: 10 mg/dL (ref 6–24)
Bilirubin Total: <0.2 mg/dL (ref 0.0–1.2)
CO2: 24 mmol/L (ref 20–29)
Calcium: 9 mg/dL (ref 8.7–10.2)
Chloride: 102 mmol/L (ref 96–106)
Creatinine, Ser: 0.75 mg/dL (ref 0.57–1.00)
Globulin, Total: 2.8 g/dL (ref 1.5–4.5)
Glucose: 98 mg/dL (ref 70–99)
Potassium: 3.9 mmol/L (ref 3.5–5.2)
Sodium: 140 mmol/L (ref 134–144)
Total Protein: 6.6 g/dL (ref 6.0–8.5)
eGFR: 99 mL/min/{1.73_m2} (ref >59)

## 2023-05-30 LAB — LIPID PANEL
Chol/HDL Ratio: 2.6 ratio (ref 0.0–4.4)
Cholesterol, Total: 206 mg/dL — ABNORMAL HIGH (ref 100–199)
HDL: 79 mg/dL (ref >39)
LDL Chol Calc (NIH): 114 mg/dL — ABNORMAL HIGH (ref 0–99)
Triglycerides: 73 mg/dL (ref 0–149)
VLDL Cholesterol Cal: 13 mg/dL (ref 5–40)

## 2023-05-30 LAB — CBC WITH DIFFERENTIAL/PLATELET
Basophils Absolute: 0 10*3/uL (ref 0.0–0.2)
Basos: 0 %
EOS (ABSOLUTE): 0.1 10*3/uL (ref 0.0–0.4)
Eos: 1 %
Hematocrit: 39.4 % (ref 34.0–46.6)
Hemoglobin: 12.5 g/dL (ref 11.1–15.9)
Immature Grans (Abs): 0 10*3/uL (ref 0.0–0.1)
Immature Granulocytes: 1 %
Lymphocytes Absolute: 3.5 10*3/uL — ABNORMAL HIGH (ref 0.7–3.1)
Lymphs: 43 %
MCH: 26.8 pg (ref 26.6–33.0)
MCHC: 31.7 g/dL (ref 31.5–35.7)
MCV: 84 fL (ref 79–97)
Monocytes Absolute: 0.6 10*3/uL (ref 0.1–0.9)
Monocytes: 8 %
Neutrophils Absolute: 3.8 10*3/uL (ref 1.4–7.0)
Neutrophils: 47 %
Platelets: 342 10*3/uL (ref 150–450)
RBC: 4.67 x10E6/uL (ref 3.77–5.28)
RDW: 14 % (ref 11.7–15.4)
WBC: 8 10*3/uL (ref 3.4–10.8)

## 2023-05-30 LAB — MICROALBUMIN / CREATININE URINE RATIO
Creatinine, Urine: 117.4 mg/dL
Microalb/Creat Ratio: 3 mg/g{creat} (ref 0–29)
Microalbumin, Urine: 3.3 ug/mL

## 2023-05-30 LAB — HEMOGLOBIN A1C
Est. average glucose Bld gHb Est-mCnc: 131 mg/dL
Hgb A1c MFr Bld: 6.2 % — ABNORMAL HIGH (ref 4.8–5.6)

## 2023-05-30 LAB — TSH: TSH: 0.691 u[IU]/mL (ref 0.450–4.500)

## 2023-06-03 ENCOUNTER — Other Ambulatory Visit: Payer: Self-pay | Admitting: Nurse Practitioner

## 2023-06-03 DIAGNOSIS — R3121 Asymptomatic microscopic hematuria: Secondary | ICD-10-CM

## 2023-06-04 ENCOUNTER — Encounter: Payer: Self-pay | Admitting: Nurse Practitioner

## 2023-06-04 NOTE — Assessment & Plan Note (Signed)
Hgba1c remains stable. Continue current medications. Encouraged to focus on diet and regular exercise.

## 2023-06-04 NOTE — Assessment & Plan Note (Signed)

## 2023-06-04 NOTE — Assessment & Plan Note (Signed)

## 2023-06-04 NOTE — Assessment & Plan Note (Signed)
Influenza vaccine administered Encouraged to take Tylenol as needed for fever or muscle aches.

## 2023-06-04 NOTE — Assessment & Plan Note (Addendum)
She is encouraged to strive for BMI less than 30 to decrease cardiac risk. Advised to aim for at least 150 minutes of exercise per week. Continue doing well with your exercise. Will refill her phentermine, this will likely be the last refill

## 2023-06-04 NOTE — Assessment & Plan Note (Addendum)
Blood pressure is well controlled, continue current medications. EKG done with NSR HR 86

## 2023-06-04 NOTE — Assessment & Plan Note (Signed)
Will check lipid panel. No current medications.

## 2023-06-06 ENCOUNTER — Encounter: Payer: Self-pay | Admitting: Nurse Practitioner

## 2023-06-07 ENCOUNTER — Other Ambulatory Visit: Payer: Self-pay | Admitting: Nurse Practitioner

## 2023-06-10 ENCOUNTER — Encounter: Payer: BC Managed Care – PPO | Admitting: Nurse Practitioner

## 2023-06-11 ENCOUNTER — Encounter: Payer: Self-pay | Admitting: Family Medicine

## 2023-06-11 ENCOUNTER — Ambulatory Visit (INDEPENDENT_AMBULATORY_CARE_PROVIDER_SITE_OTHER): Payer: BC Managed Care – PPO | Admitting: Family Medicine

## 2023-06-11 VITALS — BP 130/90 | HR 91 | Temp 98.1°F | Ht 63.0 in | Wt 227.0 lb

## 2023-06-11 DIAGNOSIS — E66813 Obesity, class 3: Secondary | ICD-10-CM | POA: Diagnosis not present

## 2023-06-11 DIAGNOSIS — M25519 Pain in unspecified shoulder: Secondary | ICD-10-CM | POA: Insufficient documentation

## 2023-06-11 DIAGNOSIS — M25511 Pain in right shoulder: Secondary | ICD-10-CM | POA: Insufficient documentation

## 2023-06-11 DIAGNOSIS — R7303 Prediabetes: Secondary | ICD-10-CM | POA: Diagnosis not present

## 2023-06-11 DIAGNOSIS — Z6841 Body Mass Index (BMI) 40.0 and over, adult: Secondary | ICD-10-CM

## 2023-06-11 MED ORDER — PREDNISONE 10 MG (21) PO TBPK
ORAL_TABLET | ORAL | 0 refills | Status: DC
Start: 2023-06-11 — End: 2023-09-02

## 2023-06-11 MED ORDER — TRIAMCINOLONE ACETONIDE 40 MG/ML IJ SUSP
60.0000 mg | Freq: Once | INTRAMUSCULAR | Status: AC
Start: 2023-06-11 — End: 2023-06-11
  Administered 2023-06-11: 60 mg via INTRAMUSCULAR

## 2023-06-11 MED ORDER — METFORMIN HCL 1000 MG PO TABS: 1000.0000 mg | ORAL_TABLET | Freq: Every day | ORAL | 1 refills | Status: DC

## 2023-06-11 NOTE — Assessment & Plan Note (Signed)
She is encouraged to strive for BMI less than 30 to decrease cardiac risk. Advised to aim for at least 150 minutes of exercise per week.

## 2023-06-11 NOTE — Assessment & Plan Note (Signed)
Low carbs diet advised.

## 2023-06-11 NOTE — Progress Notes (Signed)
I,Jameka J Llittleton, CMA,acting as a Neurosurgeon for [employer], NP.,have documented all relevant documentation on the behalf of Ellender Hose, NP,as directed by  Ellender Hose, NP while in the presence of Ellender Hose, NP.  Subjective:  Patient ID: [name] , female    DOB: [birth year] , 47 y.o.   MRN: [id number]  Chief Complaint  Patient presents with   Shoulder Pain    HPI  Patient presents today for right hand pain that starts from the neck to shoulder all the way down.She  reports any movement of her arm makes her hurt worse. Patient states she was at  [facility] in 06/2022 and was told she had arthritis but she states she did not believe them, instead she thinks she has carpel tunnel syndrome. Patient states she tried Gabapentin and NSAIDs and her pain today is 10/10, she wants something different.  Shoulder Pain  The pain is present in the right shoulder and right arm. This is a new problem. The current episode started more than 1 month ago. There has been no history of extremity trauma. The problem occurs constantly. The problem has been gradually worsening. The quality of the pain is described as aching. The pain is at a severity of 10/10. The pain is moderate. Associated symptoms include a limited range of motion. Pertinent negatives include no numbness or tingling. The symptoms are aggravated by activity. She has tried NSAIDS, OTC ointments and OTC pain meds (Lidocaine patch) for the symptoms. The treatment provided no relief.     Past Medical History:  Diagnosis Date   Acute bronchitis 09/11/2018   Acute calculous cholecystitis 09/11/2018   Acute dermatitis 09/11/2018   Acute maxillary sinusitis 09/11/2018   Acute urinary tract infection 09/11/2018   Anemia    Closed nondisplaced fracture of lesser toe of right foot with routine healing 10/12/2018   Frequent urination 12/11/2022   Herpes simplex of female genitalia 09/11/2018   Hyperlipidemia    patient denies   Hypertension     Mass    on rt inner thigh   Pneumonia of both lower lobes due to infectious organism 10/12/2018   Right upper quadrant abdominal pain 09/18/2018   S/P myomectomy 07/20/2015   Situational depression 02/15/2019   Trichomonal vaginitis 09/11/2018     Family History  Problem Relation Age of Onset   COPD Mother    Healthy Father      Current Outpatient Medications:    predniSONE (STERAPRED UNI-PAK 21 TAB) 10 MG (21) TBPK tablet, Take as directed, Disp: 21 tablet, Rfl: 0   cetirizine (ZYRTEC ALLERGY) 10 MG tablet, Take 1 tablet (10 mg total) by mouth daily., Disp: 30 tablet, Rfl: 0   metFORMIN (GLUCOPHAGE) 1000 MG tablet, Take 1 tablet (1,000 mg total) by mouth daily with breakfast., Disp: 90 tablet, Rfl: 1   NUVARING 0.12-0.015 MG/24HR vaginal ring, Place 1 application vaginally every 30 (thirty) days., Disp: , Rfl: 11   olmesartan-hydrochlorothiazide (BENICAR HCT) 40-25 MG tablet, TAKE 1 TABLET BY MOUTH EVERY DAY, Disp: 90 tablet, Rfl: 1   phentermine (ADIPEX-P) 37.5 MG tablet, Take 1 tablet (37.5 mg total) by mouth daily before breakfast., Disp: 30 tablet, Rfl: 1   promethazine-dextromethorphan (PROMETHAZINE-DM) 6.25-15 MG/5ML syrup, Take 5 mLs by mouth 3 (three) times daily as needed for cough. (Patient not taking: Reported on 05/29/2023), Disp: 200 mL, Rfl: 0  Current Facility-Administered Medications:    triamcinolone acetonide (KENALOG-40) injection 40 mg, 40 mg, Intramuscular, Once, Arnette Felts, FNP   Allergies  Allergen Reactions   Hydrocodone-Acetaminophen Nausea And Vomiting     Review of Systems  Constitutional: Negative.   Eyes: Negative.   Musculoskeletal:  Positive for arthralgias.  Skin: Negative.   Neurological:  Negative for tingling and numbness.  Psychiatric/Behavioral: Negative.       Today's Vitals   06/11/23 0920  BP: (!) 130/90  Pulse: 91  Temp: 98.1 F (36.7 C)  Weight: 227 lb (103 kg)  Height: 5\' 3"  (1.6 m)  PainSc: 10-Worst pain ever   PainLoc: Shoulder   Body mass index is 40.21 kg/m.  Wt Readings from Last 3 Encounters:  06/11/23 227 lb (103 kg)  05/29/23 219 lb (99.3 kg)  02/10/23 225 lb 3.2 oz (102.2 kg)    The 10-year ASCVD risk score (Arnett DK, et al., 2019) is: 1.5%   Values used to calculate the score:     Age: 5 years     Sex: Female     Is Non-Hispanic African American: Yes     Diabetic: No     Tobacco smoker: No     Systolic Blood Pressure: 130 mmHg     Is BP treated: Yes     HDL Cholesterol: 79 mg/dL     Total Cholesterol: 206 mg/dL  Objective:  Physical Exam Cardiovascular:     Rate and Rhythm: Normal rate and regular rhythm.  Pulmonary:     Effort: Pulmonary effort is normal.     Breath sounds: Normal breath sounds.  Musculoskeletal:        General: Tenderness present.     Right shoulder: Tenderness present.     Right upper arm: Tenderness present.     Right hand: Tenderness present. No swelling. Decreased range of motion.  Neurological:     Mental Status: She is alert.         Assessment And Plan:  Anterior shoulder pain -     Triamcinolone Acetonide -     predniSONE; Take as directed  Dispense: 21 tablet; Refill: 0 -     Ambulatory referral to Orthopedics  Prediabetes Assessment & Plan: Low carbs diet advised.  Orders: -     metFORMIN HCl; Take 1 tablet (1,000 mg total) by mouth daily with breakfast.  Dispense: 90 tablet; Refill: 1  Class 3 severe obesity due to excess calories without serious comorbidity with body mass index (BMI) of 40.0 to 44.9 in adult [facility]) Assessment & Plan: She is encouraged to strive for BMI less than 30 to decrease cardiac risk. Advised to aim for at least 150 minutes of exercise per week.      Return if symptoms worsen or fail to improve.  Patient was given opportunity to ask questions. Patient verbalized understanding of the plan and was able to repeat key elements of the plan. All questions were answered to their satisfaction.    I, Ellender Hose, NP, have reviewed all documentation for this visit. The documentation on 06/11/2023 for the exam, diagnosis, procedures, and orders are all accurate and complete.    IF YOU HAVE BEEN REFERRED TO A SPECIALIST, IT MAY TAKE 1-2 WEEKS TO SCHEDULE/PROCESS THE REFERRAL. IF YOU HAVE NOT HEARD FROM US/SPECIALIST IN TWO WEEKS, PLEASE GIVE [place] A CALL AT [phone].

## 2023-06-12 ENCOUNTER — Ambulatory Visit: Payer: BC Managed Care – PPO

## 2023-06-12 ENCOUNTER — Other Ambulatory Visit: Payer: BC Managed Care – PPO

## 2023-07-16 ENCOUNTER — Ambulatory Visit
Admission: RE | Admit: 2023-07-16 | Discharge: 2023-07-16 | Disposition: A | Discharge status: Home / Self Care | Payer: BC Managed Care – PPO | Source: Ambulatory Visit | Attending: Nurse Practitioner | Admitting: Nurse Practitioner

## 2023-07-16 ENCOUNTER — Ambulatory Visit
Admission: RE | Admit: 2023-07-16 | Discharge: 2023-07-16 | Disposition: A | Discharge status: Home / Self Care | Payer: BC Managed Care – PPO | Source: Ambulatory Visit | Attending: Obstetrics and Gynecology | Admitting: Obstetrics and Gynecology

## 2023-07-16 DIAGNOSIS — Z0000 Encounter for general adult medical examination without abnormal findings: Secondary | ICD-10-CM

## 2023-07-16 DIAGNOSIS — R3121 Asymptomatic microscopic hematuria: Secondary | ICD-10-CM

## 2023-08-22 ENCOUNTER — Other Ambulatory Visit: Payer: Self-pay | Admitting: Nurse Practitioner

## 2023-08-24 ENCOUNTER — Encounter: Payer: Self-pay | Admitting: Nurse Practitioner

## 2023-09-02 ENCOUNTER — Encounter: Payer: Self-pay | Admitting: Nurse Practitioner

## 2023-09-02 ENCOUNTER — Ambulatory Visit (INDEPENDENT_AMBULATORY_CARE_PROVIDER_SITE_OTHER): Payer: BC Managed Care – PPO | Admitting: Nurse Practitioner

## 2023-09-02 VITALS — BP 120/80 | HR 95 | Temp 98.4°F | Ht 63.0 in | Wt 231.4 lb

## 2023-09-02 DIAGNOSIS — Z2821 Immunization not carried out because of patient refusal: Secondary | ICD-10-CM

## 2023-09-02 DIAGNOSIS — I10 Essential (primary) hypertension: Secondary | ICD-10-CM | POA: Diagnosis not present

## 2023-09-02 DIAGNOSIS — R7303 Prediabetes: Secondary | ICD-10-CM

## 2023-09-02 DIAGNOSIS — Z87898 Personal history of other specified conditions: Secondary | ICD-10-CM

## 2023-09-02 DIAGNOSIS — E66813 Obesity, class 3: Secondary | ICD-10-CM

## 2023-09-02 DIAGNOSIS — Z6841 Body Mass Index (BMI) 40.0 and over, adult: Secondary | ICD-10-CM

## 2023-09-02 MED ORDER — SCOPOLAMINE 1 MG/3DAYS TD PT72: 1.0000 | MEDICATED_PATCH | TRANSDERMAL | 0 refills | Status: DC

## 2023-09-02 MED ORDER — ZEPBOUND 2.5 MG/0.5ML ~~LOC~~ SOAJ: 2.5000 mg | SUBCUTANEOUS | 0 refills | Status: DC

## 2023-09-02 NOTE — Progress Notes (Signed)
Madelaine Bhat, CMA,acting as a Neurosurgeon for Arnette Felts, FNP.,have documented all relevant documentation on the behalf of Arnette Felts, FNP,as directed by  Arnette Felts, FNP while in the presence of Arnette Felts, FNP.  Subjective:  Patient ID: [name] , female    DOB: 1976-05-08 , 48 y.o.   MRN: [id number]  Chief Complaint  Patient presents with   Obesity    HPI  Patient presents today for wanting phentermine patient was previously on phentermine in August.  Patient reports she would like a motion sickness patch she is flying soon. Patient reports she has tried exercise and diet and nothing is helping.    She maintained her weight until the holidays where she began to increased her. Since the holidays she has not gotten back on track since    Past Medical History:  Diagnosis Date   Acute bronchitis 09/11/2018   Acute calculous cholecystitis 09/11/2018   Acute dermatitis 09/11/2018   Acute maxillary sinusitis 09/11/2018   Acute urinary tract infection 09/11/2018   Anemia    Closed nondisplaced fracture of lesser toe of right foot with routine healing 10/12/2018   Frequent urination 12/11/2022   Herpes simplex of female genitalia 09/11/2018   Hyperlipidemia    patient denies   Hypertension    Mass    on rt inner thigh   Pneumonia of both lower lobes due to infectious organism 10/12/2018   Right upper quadrant abdominal pain 09/18/2018   S/P myomectomy 07/20/2015   Situational depression 02/15/2019   Trichomonal vaginitis 09/11/2018     Family History  Problem Relation Age of Onset   COPD Mother    Healthy Father      Current Outpatient Medications:    cetirizine (ZYRTEC ALLERGY) 10 MG tablet, Take 1 tablet (10 mg total) by mouth daily., Disp: 30 tablet, Rfl: 0   metFORMIN (GLUCOPHAGE) 1000 MG tablet, Take 1 tablet (1,000 mg total) by mouth daily with breakfast., Disp: 90 tablet, Rfl: 1   NUVARING 0.12-0.015 MG/24HR vaginal ring, Place 1 application vaginally  every 30 (thirty) days., Disp: , Rfl: 11   olmesartan-hydrochlorothiazide (BENICAR HCT) 40-25 MG tablet, TAKE 1 TABLET BY MOUTH EVERY DAY, Disp: 90 tablet, Rfl: 1   scopolamine (TRANSDERM-SCOP) 1 MG/3DAYS, Place 1 patch (1.5 mg total) onto the skin every 3 (three) days., Disp: 10 patch, Rfl: 0   tirzepatide (ZEPBOUND) 2.5 MG/0.5ML Pen, Inject 2.5 mg into the skin once a week., Disp: 2 mL, Rfl: 0  Current Facility-Administered Medications:    triamcinolone acetonide (KENALOG-40) injection 40 mg, 40 mg, Intramuscular, Once, Arnette Felts, FNP   Allergies  Allergen Reactions   Hydrocodone-Acetaminophen Nausea And Vomiting     Review of Systems  Constitutional: Negative.   HENT:  Negative for congestion.   Respiratory:  Negative for cough.   Cardiovascular: Negative.   Gastrointestinal: Negative.   Genitourinary: Negative.   Neurological: Negative.   Psychiatric/Behavioral: Negative.       Today's Vitals   09/02/23 1055  BP: 120/80  Pulse: 95  Temp: 98.4 F (36.9 C)  TempSrc: Oral  Weight: 231 lb 6.4 oz (105 kg)  Height: 5\' 3"  (1.6 m)  PainSc: 0-No pain   Body mass index is 40.99 kg/m.  Wt Readings from Last 3 Encounters:  09/02/23 231 lb 6.4 oz (105 kg)  06/11/23 227 lb (103 kg)  05/29/23 219 lb (99.3 kg)     Objective:  Physical Exam Vitals reviewed.  Constitutional:      General:  She is not in acute distress.    Appearance: Normal appearance. She is obese.  HENT:     Right Ear: Tympanic membrane, ear canal and external ear normal. There is no impacted cerumen.     Left Ear: Tympanic membrane, ear canal and external ear normal. There is no impacted cerumen.  Cardiovascular:     Rate and Rhythm: Normal rate and regular rhythm.     Pulses: Normal pulses.     Heart sounds: Normal heart sounds. No murmur heard. Pulmonary:     Effort: Pulmonary effort is normal. No respiratory distress.     Breath sounds: Normal breath sounds. No wheezing.  Skin:    General: Skin  is warm and dry.     Capillary Refill: Capillary refill takes less than 2 seconds.     Coloration: Skin is not jaundiced.     Findings: No bruising.  Neurological:     General: No focal deficit present.     Mental Status: She is alert and oriented to person, place, and time.     Cranial Nerves: No cranial nerve deficit.     Motor: No weakness.  Psychiatric:        Mood and Affect: Mood normal.        Behavior: Behavior normal.        Thought Content: Thought content normal.        Judgment: Judgment normal.         Assessment And Plan:  Essential hypertension Assessment & Plan: Blood pressure is well controlled, continue current medications.   Orders: -     Microalbumin / creatinine urine ratio -     BMP8+eGFR  Prediabetes Assessment & Plan: Hgba1c remains stable. Continue current medications. Encouraged to focus on diet and regular exercise.  Orders: -     Hemoglobin A1c  COVID-19 vaccination declined Assessment & Plan: Declines covid 19 vaccine. Discussed risk of covid 73 and if she changes her mind about the vaccine to call the office. Education has been provided regarding the importance of this vaccine but patient still declined. Advised may receive this vaccine at local pharmacy or Health Dept.or vaccine clinic. Aware to provide a copy of the vaccination record if obtained from local pharmacy or Health Dept.  Encouraged to take multivitamin, vitamin d, vitamin c and zinc to increase immune system. Aware can call office if would like to have vaccine here at office. Verbalized acceptance and understanding.     Class 3 severe obesity due to excess calories without serious comorbidity with body mass index (BMI) of 40.0 to 44.9 in adult [facility]) Assessment & Plan: Unfortunately she has gained approximately 12lbs since her last visit in October. We will see if her insurance company will cover Zepbound. Discussed side effects and if approved she will need to bring the pen in for  teaching.     History of motion sickness Assessment & Plan: Upcoming flight, sent rx for scopolamine patches.   Orders: -     Scopolamine; Place 1 patch (1.5 mg total) onto the skin every 3 (three) days.  Dispense: 10 patch; Refill: 0  Other orders -     Zepbound; Inject 2.5 mg into the skin once a week.  Dispense: 2 mL; Refill: 0    Return for 2 months weight check.  Patient was given opportunity to ask questions. Patient verbalized understanding of the plan and was able to repeat key elements of the plan. All questions were answered to their satisfaction.  Jeanell Sparrow, FNP, have reviewed all documentation for this visit. The documentation on 09/02/23 for the exam, diagnosis, procedures, and orders are all accurate and complete.   IF YOU HAVE BEEN REFERRED TO A SPECIALIST, IT MAY TAKE 1-2 WEEKS TO SCHEDULE/PROCESS THE REFERRAL. IF YOU HAVE NOT HEARD FROM US/SPECIALIST IN TWO WEEKS, PLEASE GIVE [place] A CALL AT [phone].

## 2023-09-03 LAB — BMP8+EGFR
BUN/Creatinine Ratio: 16 (ref 9–23)
BUN: 12 mg/dL (ref 6–24)
CO2: 27 mmol/L (ref 20–29)
Calcium: 9.3 mg/dL (ref 8.7–10.2)
Chloride: 102 mmol/L (ref 96–106)
Creatinine, Ser: 0.76 mg/dL (ref 0.57–1.00)
Glucose: 74 mg/dL (ref 70–99)
Potassium: 4 mmol/L (ref 3.5–5.2)
Sodium: 140 mmol/L (ref 134–144)
eGFR: 98 mL/min/{1.73_m2} (ref >59)

## 2023-09-03 LAB — HEMOGLOBIN A1C
Est. average glucose Bld gHb Est-mCnc: 128 mg/dL
Hgb A1c MFr Bld: 6.1 % — ABNORMAL HIGH (ref 4.8–5.6)

## 2023-09-03 LAB — MICROALBUMIN / CREATININE URINE RATIO
Creatinine, Urine: 126.3 mg/dL
Microalb/Creat Ratio: 4 mg/g{creat} (ref 0–29)
Microalbumin, Urine: 5.6 ug/mL

## 2023-09-05 ENCOUNTER — Other Ambulatory Visit: Payer: Self-pay | Admitting: Nurse Practitioner

## 2023-09-05 DIAGNOSIS — I10 Essential (primary) hypertension: Secondary | ICD-10-CM

## 2023-09-07 ENCOUNTER — Encounter: Payer: Self-pay | Admitting: Nurse Practitioner

## 2023-09-07 DIAGNOSIS — Z87898 Personal history of other specified conditions: Secondary | ICD-10-CM | POA: Insufficient documentation

## 2023-09-07 NOTE — Assessment & Plan Note (Signed)
Upcoming flight, sent rx for scopolamine patches.

## 2023-09-07 NOTE — Assessment & Plan Note (Signed)
Unfortunately she has gained approximately 12lbs since her last visit in October. We will see if her insurance company will cover Zepbound. 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

## 2023-09-07 NOTE — Assessment & Plan Note (Signed)
Hgba1c remains stable. Continue current medications. Encouraged to focus on diet and regular exercise.

## 2023-09-07 NOTE — Assessment & Plan Note (Signed)

## 2023-09-07 NOTE — Assessment & Plan Note (Signed)
 Blood pressure is well controlled, continue current medications.

## 2023-09-07 NOTE — Assessment & Plan Note (Deleted)
Upcoming flight, sent rx for scopolamine patches.

## 2023-09-08 ENCOUNTER — Other Ambulatory Visit: Payer: Self-pay

## 2023-09-08 DIAGNOSIS — I10 Essential (primary) hypertension: Secondary | ICD-10-CM

## 2023-09-08 MED ORDER — OLMESARTAN MEDOXOMIL-HCTZ 40-25 MG PO TABS
1.0000 | ORAL_TABLET | Freq: Every day | ORAL | 1 refills | Status: DC
Start: 2023-09-08 — End: 2023-10-08

## 2023-10-07 NOTE — Progress Notes (Signed)
 Madelaine Bhat, CMA,acting as a Neurosurgeon for Arnette Felts, FNP.,have documented all relevant documentation on the behalf of Arnette Felts, FNP,as directed by  Arnette Felts, FNP while in the presence of Arnette Felts, FNP.  Subjective:  Patient ID: [name] , female    DOB: Dec 26, 1975 , 48 y.o.   MRN: [id number]  Chief Complaint  Patient presents with   Urinary Tract Infection    HPI  Patient presents today for frequent urination and urinary pressure. Patient reports she was previously treated at her OBGYN on 09/15/2023. Patient reports her symptoms never subsided. Patient has finished her antibiotic (augmentin). She has been drinking cranberry juice without improvement. She is also drinking water during the day. She is awakening at night to use the bathroom.   Patient reports she doesn't have regular bowel movements but when she does it is like water. She has taken Linzess in the past.   She reports she snores. Patient would like to do a at home sleep study she reports she does snore.      Past Medical History:  Diagnosis Date   Acute bronchitis 09/11/2018   Acute calculous cholecystitis 09/11/2018   Acute dermatitis 09/11/2018   Acute maxillary sinusitis 09/11/2018   Acute urinary tract infection 09/11/2018   Anemia    Closed nondisplaced fracture of lesser toe of right foot with routine healing 10/12/2018   Frequent urination 12/11/2022   Herpes simplex of female genitalia 09/11/2018   Hyperlipidemia    patient denies   Hypertension    Mass    on rt inner thigh   Pneumonia of both lower lobes due to infectious organism 10/12/2018   Right upper quadrant abdominal pain 09/18/2018   S/P myomectomy 07/20/2015   Situational depression 02/15/2019   Trichomonal vaginitis 09/11/2018     Family History  Problem Relation Age of Onset   COPD Mother    Healthy Father      Current Outpatient Medications:    cetirizine (ZYRTEC ALLERGY) 10 MG tablet, Take 1 tablet (10 mg  total) by mouth daily., Disp: 30 tablet, Rfl: 0   lubiprostone (AMITIZA) 8 MCG capsule, Take 1 capsule (8 mcg total) by mouth daily with breakfast., Disp: 30 capsule, Rfl: 2   metFORMIN (GLUCOPHAGE-XR) 750 MG 24 hr tablet, Take 1 tablet (750 mg total) by mouth daily with breakfast., Disp: 90 tablet, Rfl: 1   NUVARING 0.12-0.015 MG/24HR vaginal ring, Place 1 application vaginally every 30 (thirty) days., Disp: , Rfl: 11   olmesartan-hydrochlorothiazide (BENICAR HCT) 40-25 MG tablet, TAKE 1 TABLET BY MOUTH EVERY DAY, Disp: 90 tablet, Rfl: 1   phentermine (ADIPEX-P) 37.5 MG tablet, TAKE 1 TABLET BY MOUTH DAILY BEFORE BREAKFAST, Disp: 30 tablet, Rfl: 1   scopolamine (TRANSDERM-SCOP) 1 MG/3DAYS, Place 1 patch (1.5 mg total) onto the skin every 3 (three) days., Disp: 10 patch, Rfl: 0   tirzepatide (ZEPBOUND) 2.5 MG/0.5ML Pen, Inject 2.5 mg into the skin once a week., Disp: 2 mL, Rfl: 0   Allergies  Allergen Reactions   Hydrocodone-Acetaminophen Nausea And Vomiting     Review of Systems  Constitutional: Negative.   Respiratory: Negative.    Cardiovascular: Negative.   Neurological: Negative.   Psychiatric/Behavioral: Negative.       Today's Vitals   10/08/23 0928  BP: 130/70  Pulse: 85  Temp: 98.6 F (37 C)  TempSrc: Oral  Weight: 235 lb 6.4 oz (106.8 kg)  Height: 5\' 3"  (1.6 m)  PainSc: 0-No pain   Body mass  index is 41.7 kg/m.  Wt Readings from Last 3 Encounters:  10/08/23 235 lb 6.4 oz (106.8 kg)  09/02/23 231 lb 6.4 oz (105 kg)  06/11/23 227 lb (103 kg)     Objective:  Physical Exam Vitals reviewed.  Constitutional:      General: She is not in acute distress.    Appearance: Normal appearance. She is obese.  Cardiovascular:     Rate and Rhythm: Normal rate and regular rhythm.     Pulses: Normal pulses.     Heart sounds: Normal heart sounds. No murmur heard. Pulmonary:     Effort: Pulmonary effort is normal. No respiratory distress.     Breath sounds: Normal breath  sounds. No wheezing.  Skin:    General: Skin is warm and dry.     Capillary Refill: Capillary refill takes less than 2 seconds.     Coloration: Skin is not jaundiced.     Findings: No bruising.  Neurological:     General: No focal deficit present.     Mental Status: She is alert and oriented to person, place, and time.     Cranial Nerves: No cranial nerve deficit.     Motor: No weakness.  Psychiatric:        Mood and Affect: Mood normal.        Behavior: Behavior normal.        Thought Content: Thought content normal.        Judgment: Judgment normal.         Assessment And Plan:  Frequent urination Assessment & Plan: Continues to have urinary frequency urinalysis is negative for nitrates, small leuk present will treat with Rocephin empirically. She was positive for proteus mirabilis for her culture in Feb, sensitive to cephalasporins in addition to augmentin. Blood sugar in office was 91  Orders: -     POCT URINALYSIS DIP (CLINITEK) -     Urine Culture -     CT ABDOMEN PELVIS WO CONTRAST; Future  Abdominal pressure Assessment & Plan: Tenderness to lower abdomen, will order CT scan of abdomen.   Orders: -     POCT URINALYSIS DIP (CLINITEK) -     CT ABDOMEN PELVIS WO CONTRAST; Future  Snoring Assessment & Plan: Epworth score 10. Will order a home sleep study depending on diagnosis will consider Zepbound for sleep apnea.    Chronic idiopathic constipation Assessment & Plan: Will try her on amitiza over the counter and natural supplements have not been effective.   Orders: -     Ambulatory referral to Gastroenterology -     metFORMIN HCl ER; Take 1 tablet (750 mg total) by mouth daily with breakfast.  Dispense: 90 tablet; Refill: 1  Acute cystitis with hematuria Assessment & Plan: Will recheck urine culture, recently treated with antibiotics but still having symptoms. Will administer rocephin injection based on results of last urine culture.   Orders: -      cefTRIAXone Sodium  COVID-19 vaccination declined Assessment & Plan: Declines covid 19 vaccine. Discussed risk of covid 77 and if she changes her mind about the vaccine to call the office. Education has been provided regarding the importance of this vaccine but patient still declined. Advised may receive this vaccine at local pharmacy or Health Dept.or vaccine clinic. Aware to provide a copy of the vaccination record if obtained from local pharmacy or Health Dept.  Encouraged to take multivitamin, vitamin d, vitamin c and zinc to increase immune system. Aware can call office if  would like to have vaccine here at office. Verbalized acceptance and understanding.     Morbid obesity with BMI of 40.0-44.9, adult [facility]) Assessment & Plan: She is encouraged to strive for BMI less than 30 to decrease cardiac risk. Advised to aim for at least 150 minutes of exercise per week. She is temporarily taking phentermine, had made her aware last week we have to decide on another weight loss medication option   Other orders -     Lubiprostone; Take 1 capsule (8 mcg total) by mouth daily with breakfast.  Dispense: 30 capsule; Refill: 2    No follow-ups on file.  Patient was given opportunity to ask questions. Patient verbalized understanding of the plan and was able to repeat key elements of the plan. All questions were answered to their satisfaction.    Jeanell Sparrow, FNP, have reviewed all documentation for this visit. The documentation on 10/08/23 for the exam, diagnosis, procedures, and orders are all accurate and complete.   IF YOU HAVE BEEN REFERRED TO A SPECIALIST, IT MAY TAKE 1-2 WEEKS TO SCHEDULE/PROCESS THE REFERRAL. IF YOU HAVE NOT HEARD FROM US/SPECIALIST IN TWO WEEKS, PLEASE GIVE [place] A CALL AT [phone].

## 2023-10-08 ENCOUNTER — Encounter: Payer: Self-pay | Admitting: Nurse Practitioner

## 2023-10-08 ENCOUNTER — Ambulatory Visit (INDEPENDENT_AMBULATORY_CARE_PROVIDER_SITE_OTHER): Payer: Self-pay | Admitting: Nurse Practitioner

## 2023-10-08 VITALS — BP 130/70 | HR 85 | Temp 98.6°F | Ht 63.0 in | Wt 235.4 lb

## 2023-10-08 DIAGNOSIS — N3001 Acute cystitis with hematuria: Secondary | ICD-10-CM | POA: Insufficient documentation

## 2023-10-08 DIAGNOSIS — Z2821 Immunization not carried out because of patient refusal: Secondary | ICD-10-CM

## 2023-10-08 DIAGNOSIS — R350 Frequency of micturition: Secondary | ICD-10-CM | POA: Diagnosis not present

## 2023-10-08 DIAGNOSIS — R109 Unspecified abdominal pain: Secondary | ICD-10-CM | POA: Insufficient documentation

## 2023-10-08 DIAGNOSIS — R0683 Snoring: Secondary | ICD-10-CM

## 2023-10-08 DIAGNOSIS — K5904 Chronic idiopathic constipation: Secondary | ICD-10-CM

## 2023-10-08 DIAGNOSIS — Z6841 Body Mass Index (BMI) 40.0 and over, adult: Secondary | ICD-10-CM

## 2023-10-08 DIAGNOSIS — R3129 Other microscopic hematuria: Secondary | ICD-10-CM | POA: Insufficient documentation

## 2023-10-08 LAB — POCT URINALYSIS DIP (CLINITEK)
Bilirubin, UA: NEGATIVE
Glucose, UA: NEGATIVE mg/dL
Ketones, POC UA: NEGATIVE mg/dL
Nitrite, UA: NEGATIVE
POC PROTEIN,UA: NEGATIVE
Spec Grav, UA: 1.02 (ref 1.010–1.025)
Urobilinogen, UA: 0.2 U/dL
pH, UA: 7 (ref 5.0–8.0)

## 2023-10-08 MED ORDER — CEFTRIAXONE SODIUM 1 G IJ SOLR
1.0000 g | Freq: Once | INTRAMUSCULAR | Status: AC
Start: 2023-10-08 — End: 2023-10-08
  Administered 2023-10-08: 1 g via INTRAMUSCULAR

## 2023-10-08 MED ORDER — METFORMIN HCL ER 750 MG PO TB24
750.0000 mg | ORAL_TABLET | Freq: Every day | ORAL | 1 refills | Status: DC
Start: 2023-10-08 — End: 2024-05-14

## 2023-10-08 MED ORDER — LUBIPROSTONE 8 MCG PO CAPS: 8.0000 ug | ORAL_CAPSULE | Freq: Every day | ORAL | 2 refills | Status: AC

## 2023-10-08 NOTE — Assessment & Plan Note (Signed)
 Continues to have urinary frequency urinalysis is negative for nitrates, small leuk present will treat with Rocephin empirically. She was positive for proteus mirabilis for her culture in Feb, sensitive to cephalasporins in addition to augmentin. Blood sugar in office was 91

## 2023-10-08 NOTE — Assessment & Plan Note (Signed)
 Tenderness to lower abdomen, will order CT scan of abdomen.

## 2023-10-08 NOTE — Assessment & Plan Note (Signed)
 Epworth score 10. Will order a home sleep study depending on diagnosis will consider Zepbound for sleep apnea.

## 2023-10-08 NOTE — Assessment & Plan Note (Signed)
 She is encouraged to strive for BMI less than 30 to decrease cardiac risk. Advised to aim for at least 150 minutes of exercise per week. She is temporarily taking phentermine, had made her aware last week we have to decide on another weight loss medication option

## 2023-10-08 NOTE — Assessment & Plan Note (Signed)

## 2023-10-09 LAB — URINE CULTURE: Organism ID, Bacteria: NO GROWTH

## 2023-10-12 ENCOUNTER — Encounter: Payer: Self-pay | Admitting: Nurse Practitioner

## 2023-10-12 NOTE — Assessment & Plan Note (Signed)
 Will recheck urine culture, recently treated with antibiotics but still having symptoms. Will administer rocephin injection based on results of last urine culture.

## 2023-10-12 NOTE — Assessment & Plan Note (Signed)
 Will try her on amitiza over the counter and natural supplements have not been effective.

## 2023-10-16 ENCOUNTER — Telehealth: Payer: Self-pay

## 2023-10-16 NOTE — Telephone Encounter (Signed)
 [facility] confirmed they have received paper work for patients sleep study.

## 2023-10-31 ENCOUNTER — Other Ambulatory Visit

## 2023-11-03 ENCOUNTER — Other Ambulatory Visit

## 2023-11-24 ENCOUNTER — Other Ambulatory Visit

## 2023-11-27 ENCOUNTER — Ambulatory Visit (INDEPENDENT_AMBULATORY_CARE_PROVIDER_SITE_OTHER): Payer: BC Managed Care – PPO | Admitting: Nurse Practitioner

## 2023-11-27 ENCOUNTER — Encounter: Payer: Self-pay | Admitting: Nurse Practitioner

## 2023-11-27 VITALS — BP 120/70 | HR 91 | Temp 98.2°F | Ht 63.0 in | Wt 245.2 lb

## 2023-11-27 DIAGNOSIS — R7303 Prediabetes: Secondary | ICD-10-CM | POA: Diagnosis not present

## 2023-11-27 DIAGNOSIS — Z2821 Immunization not carried out because of patient refusal: Secondary | ICD-10-CM

## 2023-11-27 DIAGNOSIS — Z6841 Body Mass Index (BMI) 40.0 and over, adult: Secondary | ICD-10-CM

## 2023-11-27 DIAGNOSIS — I10 Essential (primary) hypertension: Secondary | ICD-10-CM

## 2023-11-27 NOTE — Patient Instructions (Signed)
 Goal to exercise 150 minutes per week with at least 2 days of strength training Encouraged to park further when at the store, take stairs instead of elevators and to walk in place during commercials. Increase water intake to at least one gallon of water daily.  Use a free app such as myfitness pal to track your calories.   Trade out sweet treats for sweet fruits.  Your body processes these sugars better.

## 2023-11-27 NOTE — Progress Notes (Signed)
 [place], CMA,acting as a Neurosurgeon for Anita Epley, FNP.,have documented all relevant documentation on the behalf of Anita Epley, FNP,as directed by  Anita Epley, FNP while in the presence of Anita Epley, FNP.  Subjective:  Patient ID: [name] , female    DOB: 25-Oct-1975 , 48 y.o.   MRN: [id number]  Chief Complaint  Patient presents with   Hypertension    HPI  Patient presents today for a bp follow up, Patient reports compliance with medication. Patient denies any chest pain, SOB, or headaches. Patient has no concerns today.   Takes BP meds daily, no complaints or concerns with medication.  She is not checking BP regularly, does not have symptoms.  She is having issues with diet, craves sugar and eats unhealthy.  She has cut out salt and does not add it to her foods.  Drinks 64oz water almost daily.  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    Hypertension This is a chronic problem. The current episode started more than 1 year ago. The problem has been gradually improving since onset. The problem is controlled. Pertinent negatives include no blurred vision, chest pain, headaches or shortness of breath. There are no associated agents to hypertension. Risk factors for coronary artery disease include obesity, dyslipidemia, family history and sedentary lifestyle. Past treatments include calcium  channel blockers. The current treatment provides significant improvement. Compliance problems include diet and exercise.  There is no history of kidney disease, PVD or retinopathy.     Past Medical History:  Diagnosis Date   Acute bronchitis 09/11/2018   Acute calculous cholecystitis 09/11/2018   Acute dermatitis 09/11/2018   Acute maxillary sinusitis 09/11/2018   Acute urinary tract infection 09/11/2018   Anemia    Closed nondisplaced fracture of lesser toe of right foot with routine healing 10/12/2018   Frequent urination 12/11/2022   Herpes simplex of  female genitalia 09/11/2018   Hyperlipidemia    patient denies   Hypertension    Mass    on rt inner thigh   Pneumonia of both lower lobes due to infectious organism 10/12/2018   Right upper quadrant abdominal pain 09/18/2018   S/P myomectomy 07/20/2015   Situational depression 02/15/2019   Trichomonal vaginitis 09/11/2018     Family History  Problem Relation Age of Onset   COPD Mother    Healthy Father      Current Outpatient Medications:    cetirizine  (ZYRTEC  ALLERGY) 10 MG tablet, Take 1 tablet (10 mg total) by mouth daily., Disp: 30 tablet, Rfl: 0   lubiprostone  (AMITIZA ) 8 MCG capsule, Take 1 capsule (8 mcg total) by mouth daily with breakfast., Disp: 30 capsule, Rfl: 2   metFORMIN  (GLUCOPHAGE -XR) 750 MG 24 hr tablet, Take 1 tablet (750 mg total) by mouth daily with breakfast., Disp: 90 tablet, Rfl: 1   NUVARING 0.12-0.015 MG/24HR vaginal ring, Place 1 application vaginally every 30 (thirty) days., Disp: , Rfl: 11   olmesartan -hydrochlorothiazide  (BENICAR  HCT) 40-25 MG tablet, TAKE 1 TABLET BY MOUTH EVERY DAY, Disp: 90 tablet, Rfl: 1   phentermine  (ADIPEX-P ) 37.5 MG tablet, TAKE 1 TABLET BY MOUTH DAILY BEFORE BREAKFAST, Disp: 30 tablet, Rfl: 1   scopolamine  (TRANSDERM-SCOP) 1 MG/3DAYS, Place 1 patch (1.5 mg total) onto the skin every 3 (three) days., Disp: 10 patch, Rfl: 0   tirzepatide  (ZEPBOUND ) 2.5 MG/0.5ML Pen, Inject 2.5 mg into the skin once a week. (Patient not taking: Reported on 11/27/2023), Disp: 2 mL, Rfl:  0   Allergies  Allergen Reactions   Hydrocodone -Acetaminophen  Nausea And Vomiting     Review of Systems  Constitutional:  Negative for chills, fatigue and fever.  Eyes:  Negative for blurred vision.  Respiratory:  Negative for cough, chest tightness and shortness of breath.   Cardiovascular:  Negative for chest pain.  Neurological:  Negative for light-headedness and headaches.     Today's Vitals   11/27/23 1543  BP: 120/70  Pulse: 91  Temp: 98.2 F  (36.8 C)  TempSrc: Oral  Weight: 245 lb 3.2 oz (111.2 kg)  Height: 5\' 3"  (1.6 m)  PainSc: 0-No pain   Body mass index is 43.44 kg/m.  Wt Readings from Last 3 Encounters:  11/27/23 245 lb 3.2 oz (111.2 kg)  10/08/23 235 lb 6.4 oz (106.8 kg)  09/02/23 231 lb 6.4 oz (105 kg)      Objective:  Physical Exam Constitutional:      Appearance: Normal appearance. She is obese.  HENT:     Right Ear: External ear normal.     Left Ear: External ear normal.  Cardiovascular:     Rate and Rhythm: Normal rate and regular rhythm.     Pulses: Normal pulses.     Heart sounds: Normal heart sounds.  Pulmonary:     Effort: Pulmonary effort is normal.     Breath sounds: Normal breath sounds.  Skin:    General: Skin is warm and dry.     Capillary Refill: Capillary refill takes less than 2 seconds.  Neurological:     General: No focal deficit present.     Mental Status: She is alert and oriented to person, place, and time. Mental status is at baseline.  Psychiatric:        Mood and Affect: Mood normal.        Behavior: Behavior normal.        Thought Content: Thought content normal.        Judgment: Judgment normal.         Assessment And Plan:  Essential hypertension Assessment & Plan: Blood pressure is well controlled, continue current medications.    Prediabetes Assessment & Plan: Hgba1c remains stable. Continue current medications. Encouraged to focus on diet and regular exercise.   COVID-19 vaccination declined Assessment & Plan: Declines covid 19 vaccine. Discussed risk of covid 8 and if she changes her mind about the vaccine to call the office. Education has been provided regarding the importance of this vaccine but patient still declined. Advised may receive this vaccine at local pharmacy or Health Dept.or vaccine clinic. Aware to provide a copy of the vaccination record if obtained from local pharmacy or Health Dept.  Encouraged to take multivitamin, vitamin d , vitamin c and  zinc to increase immune system. Aware can call office if would like to have vaccine here at office. Verbalized acceptance and understanding.     Morbid obesity with BMI of 40.0-44.9, adult [facility]) Assessment & Plan: She is encouraged to strive for BMI less than 30 to decrease cardiac risk. Advised to aim for at least 150 minutes of exercise per week. She is willing to be seen at a weight management clinic as she has not had success with phentermine  and insurance has not approved Wegovy.      Return for KEEP SAME NEXT.  Patient was given opportunity to ask questions. Patient verbalized understanding of the plan and was able to repeat key elements of the plan. All questions were answered to their satisfaction.  I have reviewed this encounter including the documentation in this note and/or discussed this patient with Mickael Alamo FNP Student. I am certifying that I agree with the content of this note as the primary care nurse practitioner.  Anita Epley, DNP, FNP-BC  I, Anita Epley, FNP, have reviewed all documentation for this visit. The documentation on 11/27/23 for the exam, diagnosis, procedures, and orders are all accurate and complete.   IF YOU HAVE BEEN REFERRED TO A SPECIALIST, IT MAY TAKE 1-2 WEEKS TO SCHEDULE/PROCESS THE REFERRAL. IF YOU HAVE NOT HEARD FROM US /SPECIALIST IN TWO WEEKS, PLEASE GIVE US  A CALL AT [phone].

## 2023-12-09 NOTE — Assessment & Plan Note (Signed)
Hgba1c remains stable. Continue current medications. Encouraged to focus on diet and regular exercise.

## 2023-12-09 NOTE — Assessment & Plan Note (Signed)
 She is encouraged to strive for BMI less than 30 to decrease cardiac risk. Advised to aim for at least 150 minutes of exercise per week. She is willing to be seen at a weight management clinic as she has not had success with phentermine  and insurance has not approved Wegovy.

## 2023-12-09 NOTE — Assessment & Plan Note (Signed)

## 2023-12-09 NOTE — Assessment & Plan Note (Signed)
 Blood pressure is well controlled, continue current medications.

## 2024-01-12 ENCOUNTER — Encounter: Payer: Self-pay | Admitting: Nurse Practitioner

## 2024-03-06 ENCOUNTER — Other Ambulatory Visit: Payer: Self-pay | Admitting: Nurse Practitioner

## 2024-03-06 DIAGNOSIS — I10 Essential (primary) hypertension: Secondary | ICD-10-CM

## 2024-04-01 ENCOUNTER — Other Ambulatory Visit: Payer: Self-pay | Admitting: Surgery

## 2024-04-01 NOTE — Progress Notes (Signed)
 PROVIDER:  VICENTA DASIE POLI, MD  MRN: [id number] DOB: 01/21/1976 DATE OF ENCOUNTER: 04/01/2024 Subjective     Chief Complaint: New Problem (lipoma inner thigh)     History of Present Illness: [name] is a 48 y.o. female who is seen today for right inner thigh mass.  I have seen her in the past.  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 She reports that she has had the slowly growing mass on her right inner thigh for several years.  It is now getting larger and causing discomfort especially when walking and rubbing against the of the leg.  She denies trauma to the area.  She is otherwise without complaints.  Since the last time I saw her, her mammograms have been unremarkable and she no longer has any breast pain     Review of Systems: A complete review of systems was obtained from the patient.  I have reviewed this information and discussed as appropriate with the patient.  See HPI as well for other ROS.  ROS    Medical History: Past Medical History:  Diagnosis Date  . Anemia   . Arthritis   . Hypertension     Patient Active Problem List  Diagnosis  . Chronic pain of right knee  . Class 2 obesity due to excess calories without serious comorbidity with body mass index (BMI) of 37.0 to 37.9 in adult  . Closed nondisplaced fracture of lesser toe of right foot with routine healing  . Essential hypertension  . Hearing loss of left ear  . Lichenification  . Other constipation  . Pneumonia of both lower lobes due to infectious organism  . Right upper quadrant abdominal pain  . S/P myomectomy  . Situational depression  . Vertigo  . Chronic knee pain after total replacement of right knee joint    Past Surgical History:  Procedure Laterality Date  . CESAREAN SECTION  07/2003  . MYOMECTOMY ABDOMINAL  07/2015  . CHOLECYSTECTOMY  09/2019  . MASTECTOMY     Lumpectomy - 09/08/20     Allergies  Allergen Reactions  .  Hydrocodone -Acetaminophen  Nausea and Nausea And Vomiting  . Codeine Itching    Current Outpatient Medications on File Prior to Visit  Medication Sig Dispense Refill  . cetirizine  (ZYRTEC ) 10 MG tablet Take 10 mg by mouth once daily    . etonogestreL-ethinyl estradioL (NUVARING) 0.12-0.015 mg/24 hr vaginal ring etonogestrel 0.12 mg-ethinyl estradiol 0.015 mg/24 hr vaginal ring    . lubiprostone  (AMITIZA ) 8 MCG capsule Take 8 mcg by mouth daily with breakfast    . metFORMIN  (GLUCOPHAGE ) 1000 MG tablet Take 1,000 mg by mouth daily with breakfast    . olmesartan -hydrochlorothiazide  (BENICAR  HCT) 40-25 mg tablet     . phentermine  (ADIPEX-P ) 37.5 mg tablet Take 37.5 mg by mouth every morning before breakfast    . scopolamine  (TRANSDERM-SCOP) 1 mg over 3 days patch Place 1 patch onto the skin every third day    . tirzepatide  (ZEPBOUND ) 2.5 mg/0.5 mL pen injector Inject 2.5 mg subcutaneously every 7 (seven) days    . albuterol  90 mcg/actuation inhaler INHALE 1-2 PUFFS BY MOUTH EVERY 6 HOURS AS NEEDED FOR WHEEZE OR SHORTNESS OF BREATH    . dextromethorphan-guaifenesin  (MUCINEX  DM) 30-600 mg ER tablet Take 1 tablet by mouth 2 (two) times daily    . oxyCODONE  (ROXICODONE ) 5 MG immediate release tablet TAKE 1 TABLET BY MOUTH EVERY 4 TO 6 HOURS AS NEEDED FOR PAIN    .  traMADoL  (ULTRAM ) 50 mg tablet TAKE 2 TABLETS BY MOUTH EVERY 6 HOURS AS NEEDED     No current facility-administered medications on file prior to visit.    Family History  Problem Relation Age of Onset  . High blood pressure (Hypertension) Mother   . Hyperlipidemia (Elevated cholesterol) Mother   . High blood pressure (Hypertension) Father   . Hyperlipidemia (Elevated cholesterol) Father   . Diabetes Father      Social History   Tobacco Use  Smoking Status Never  Smokeless Tobacco Never     Social History   Socioeconomic History  . Marital status: Unknown  Tobacco Use  . Smoking status: Never  . Smokeless tobacco: Never   Vaping Use  . Vaping status: Never Used  Substance and Sexual Activity  . Alcohol use: Never  . Drug use: Never  . Sexual activity: Defer   Social Drivers of Health   Financial Resource Strain: Low Risk  (12/11/2022)   Received from [facility]   Overall Financial Resource Strain (CARDIA)   . Difficulty of Paying Living Expenses: Not hard at all  Food Insecurity: No Food Insecurity (12/11/2022)   Received from [facility]   Hunger Vital Sign   . Within the past 12 months, you worried that your food would run out before you got the money to buy more.: Never true   . Within the past 12 months, the food you bought just didn't last and you didn't have money to get more.: Never true  Transportation Needs: No Transportation Needs (12/11/2022)   Received from [facility] - Transportation   . Lack of Transportation (Medical): No   . Lack of Transportation (Non-Medical): No  Physical Activity: Insufficiently Active (12/11/2022)   Received from [facility]   Exercise Vital Sign   . On average, how many days per week do you engage in moderate to strenuous exercise (like a brisk walk)?: 2 days   . On average, how many minutes do you engage in exercise at this level?: 50 min  Stress: No Stress Concern Present (12/11/2022)   Received from [facility] - Occupational Stress Questionnaire   . Feeling of Stress : Only a little  Social Connections: Moderately Integrated (12/11/2022)   Received from [facility]   Social Connection and Isolation Panel   . In a typical week, how many times do you talk on the phone with family, friends, or neighbors?: More than three times a week   . How often do you get together with friends or relatives?: More than three times a week   . How often do you attend church or religious services?: More than 4 times per year   . Do you belong to any clubs or organizations such as church groups, unions, fraternal or athletic  groups, or school groups?: Yes   . How often do you attend meetings of the clubs or organizations you belong to?: More than 4 times per year   . Are you married, widowed, divorced, separated, never married, or living with a partner?: Never married    Objective:    Vitals:   04/01/24 1136  BP: 136/89  Pulse: 76  Temp: 36.7 C (98 F)  TempSrc: Temporal  SpO2: 98%  Weight: (!) 112.8 kg (248 lb 9.6 oz)  Height: 163.8 cm (5' 4.5)  PainSc: 0-No pain    Body mass index is 42.01 kg/m.  Physical Exam   She appears  well on exam  On her right inner thigh there is a 7 cm mobile mass.  There are no current skin changes.  There is no right inguinal lymphadenopathy.  The mass is nonpulsatile   Labs, Imaging and Diagnostic Testing:  I have reviewed her notes in the electronic medical records   Assessment and Plan:     Diagnoses and all orders for this visit:  Mass of right lower leg     This is a right lower leg mass of uncertain etiology on the right medial thigh.  This may represent a lipoma but I cannot rule out a desmoid tumor.  Because it is symptomatic and enlarging, surgical excision is recommended.  Again the mass measures 7 cm.  I explained the surgical procedure in detail.  We discussed the risk which includes but is not limited to bleeding, infection, injury to surrounding structures, recurrence of the mass, the need for further procedures of malignancy is found, seroma formation, cardiopulmonary issues with anesthesia, postoperative recovery, etc.  She understands and wished to proceed with surgery which will be scheduled  Moderate medical decision making  No follow-ups on file.   VICENTA DASIE POLI, MD

## 2024-04-07 ENCOUNTER — Encounter: Payer: Self-pay | Admitting: Nurse Practitioner

## 2024-04-13 ENCOUNTER — Other Ambulatory Visit: Payer: Self-pay | Admitting: Nurse Practitioner

## 2024-04-26 ENCOUNTER — Encounter (INDEPENDENT_AMBULATORY_CARE_PROVIDER_SITE_OTHER): Payer: Self-pay

## 2024-05-06 ENCOUNTER — Institutional Professional Consult (permissible substitution) (INDEPENDENT_AMBULATORY_CARE_PROVIDER_SITE_OTHER): Admitting: Nurse Practitioner

## 2024-05-14 ENCOUNTER — Other Ambulatory Visit: Payer: Self-pay | Admitting: Nurse Practitioner

## 2024-05-14 DIAGNOSIS — K5904 Chronic idiopathic constipation: Secondary | ICD-10-CM

## 2024-05-21 ENCOUNTER — Encounter: Payer: Self-pay | Admitting: Nurse Practitioner

## 2024-05-31 ENCOUNTER — Encounter: Payer: Self-pay | Admitting: Nurse Practitioner

## 2024-05-31 ENCOUNTER — Ambulatory Visit: Payer: Self-pay | Admitting: Nurse Practitioner

## 2024-05-31 VITALS — BP 130/70 | HR 86 | Temp 98.7°F | Ht 63.0 in | Wt 255.6 lb

## 2024-05-31 DIAGNOSIS — Z79899 Other long term (current) drug therapy: Secondary | ICD-10-CM

## 2024-05-31 DIAGNOSIS — I10 Essential (primary) hypertension: Secondary | ICD-10-CM | POA: Diagnosis not present

## 2024-05-31 DIAGNOSIS — Z13220 Encounter for screening for lipoid disorders: Secondary | ICD-10-CM

## 2024-05-31 DIAGNOSIS — Z136 Encounter for screening for cardiovascular disorders: Secondary | ICD-10-CM

## 2024-05-31 DIAGNOSIS — M79671 Pain in right foot: Secondary | ICD-10-CM | POA: Diagnosis not present

## 2024-05-31 DIAGNOSIS — Z139 Encounter for screening, unspecified: Secondary | ICD-10-CM

## 2024-05-31 DIAGNOSIS — Z0000 Encounter for general adult medical examination without abnormal findings: Secondary | ICD-10-CM

## 2024-05-31 DIAGNOSIS — E66813 Obesity, class 3: Secondary | ICD-10-CM

## 2024-05-31 DIAGNOSIS — E559 Vitamin D deficiency, unspecified: Secondary | ICD-10-CM

## 2024-05-31 DIAGNOSIS — R82998 Other abnormal findings in urine: Secondary | ICD-10-CM

## 2024-05-31 DIAGNOSIS — R7303 Prediabetes: Secondary | ICD-10-CM | POA: Diagnosis not present

## 2024-05-31 DIAGNOSIS — K5904 Chronic idiopathic constipation: Secondary | ICD-10-CM

## 2024-05-31 DIAGNOSIS — Z6841 Body Mass Index (BMI) 40.0 and over, adult: Secondary | ICD-10-CM

## 2024-05-31 DIAGNOSIS — Z2821 Immunization not carried out because of patient refusal: Secondary | ICD-10-CM

## 2024-05-31 DIAGNOSIS — M79672 Pain in left foot: Secondary | ICD-10-CM

## 2024-05-31 LAB — POCT URINALYSIS DIP (CLINITEK)
Bilirubin, UA: NEGATIVE
Glucose, UA: NEGATIVE mg/dL
Ketones, POC UA: NEGATIVE mg/dL
Nitrite, UA: NEGATIVE
POC PROTEIN,UA: NEGATIVE
Spec Grav, UA: 1.02 (ref 1.010–1.025)
Urobilinogen, UA: 0.2 U/dL
pH, UA: 6.5 (ref 5.0–8.0)

## 2024-05-31 MED ORDER — IBUPROFEN 800 MG PO TABS: 800.0000 mg | ORAL_TABLET | Freq: Three times a day (TID) | ORAL | 0 refills | Status: AC | PRN

## 2024-05-31 NOTE — Progress Notes (Signed)
 LILLETTE Kristeen JINNY Gladis, CMA,acting as a neurosurgeon for Gaines Ada, FNP.,have documented all relevant documentation on the behalf of Gaines Ada, FNP,as directed by  Gaines Ada, FNP while in the presence of Gaines Ada, FNP.  Subjective:    Patient ID: [name] , female    DOB: 06/13/1976 , 48 y.o.   MRN: [id number]  Chief Complaint  Patient presents with  . Annual Exam    Patient presents today for HM, Patient reports compliance with medication. Patient denies any chest pain, SOB, or headaches. Patient has no concerns today.   . Foot Pain    Patient states she has been having bilateral heel pain for the past 3 weeks. She reports it is worse when sitting and standing.     HPI  HPI   Past Medical History:  Diagnosis Date  . Acute bronchitis 09/11/2018  . Acute calculous cholecystitis 09/11/2018  . Acute dermatitis 09/11/2018  . Acute maxillary sinusitis 09/11/2018  . Acute urinary tract infection 09/11/2018  . Anemia   . Closed nondisplaced fracture of lesser toe of right foot with routine healing 10/12/2018  . Frequent urination 12/11/2022  . 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 Hyperlipidemia    patient denies  . Hypertension   . Mass    on rt inner thigh  . Pneumonia of both lower lobes due to infectious organism 10/12/2018  . Right upper quadrant abdominal pain 09/18/2018  . S/P myomectomy 07/20/2015  . Situational depression 02/15/2019  . Trichomonal vaginitis 09/11/2018     Family History  Problem Relation Age of Onset  . COPD Mother   . Healthy Father      Current Outpatient Medications:  .  cetirizine  (ZYRTEC  ALLERGY) 10 MG tablet, Take 1 tablet (10 mg total) by mouth daily., Disp: 30 tablet, Rfl: 0 .  ibuprofen  (ADVIL ) 800 MG tablet, Take 1 tablet (800 mg total) by mouth every 8 (eight) hours as needed., Disp: 30 tablet, Rfl: 0 .  lubiprostone  (AMITIZA ) 8 MCG capsule, Take 1 capsule (8 mcg total) by mouth daily with breakfast., Disp: 30 capsule,  Rfl: 2 .  metFORMIN  (GLUCOPHAGE -XR) 750 MG 24 hr tablet, TAKE 1 TABLET BY MOUTH EVERY DAY WITH BREAKFAST, Disp: 90 tablet, Rfl: 1 .  NUVARING 0.12-0.015 MG/24HR vaginal ring, Place 1 application vaginally every 30 (thirty) days., Disp: , Rfl: 11 .  olmesartan -hydrochlorothiazide  (BENICAR  HCT) 40-25 MG tablet, TAKE 1 TABLET BY MOUTH EVERY DAY, Disp: 90 tablet, Rfl: 1 .  scopolamine  (TRANSDERM-SCOP) 1 MG/3DAYS, Place 1 patch (1.5 mg total) onto the skin every 3 (three) days., Disp: 10 patch, Rfl: 0   Allergies  Allergen Reactions  . Hydrocodone -Acetaminophen  Nausea And Vomiting      The patient states she uses {contraceptive methods:5051} for birth control. No LMP recorded. (Menstrual status: IUD).. {Dysmenorrhea-menorrhagia:21918}. Negative for: breast discharge, breast lump(s), breast pain and breast self exam. Associated symptoms include abnormal vaginal bleeding. Pertinent negatives include abnormal bleeding (hematology), anxiety, decreased libido, depression, difficulty falling sleep, dyspareunia, history of infertility, nocturia, sexual dysfunction, sleep disturbances, urinary incontinence, urinary urgency, vaginal discharge and vaginal itching. Diet regular.The patient states her exercise level is    . The patient's tobacco use is:  Social History   Tobacco Use  Smoking Status Never  Smokeless Tobacco Never  . She has been exposed to passive smoke. The patient's alcohol use is:  Social History   Substance and Sexual Activity  Alcohol Use No  . Additional information: Last pap ***, next one  scheduled for ***.    Review of Systems  Constitutional: Negative.   HENT: Negative.  Negative for congestion.   Eyes: Negative.   Respiratory:  Negative for cough.   Cardiovascular: Negative.   Gastrointestinal: Negative.   Endocrine: Negative.   Genitourinary: Negative.   Musculoskeletal: Negative.   Skin: Negative.   Allergic/Immunologic: Negative.   Neurological: Negative.    Hematological: Negative.   Psychiatric/Behavioral: Negative.       Today's Vitals   05/31/24 1536  BP: 130/70  Pulse: 86  Temp: 98.7 F (37.1 C)  TempSrc: Oral  Weight: 255 lb 9.6 oz (115.9 kg)  Height: 5' 3 (1.6 m)  PainSc: 10-Worst pain ever  PainLoc: Foot   Body mass index is 45.28 kg/m.  Wt Readings from Last 3 Encounters:  05/31/24 255 lb 9.6 oz (115.9 kg)  11/27/23 245 lb 3.2 oz (111.2 kg)  10/08/23 235 lb 6.4 oz (106.8 kg)     Objective:  Physical Exam Vitals and nursing note reviewed.  Constitutional:      General: She is not in acute distress.    Appearance: Normal appearance. She is well-developed. She is obese.  HENT:     Head: Normocephalic and atraumatic.     Right Ear: Hearing, tympanic membrane, ear canal and external ear normal. There is no impacted cerumen.     Left Ear: Hearing, tympanic membrane, ear canal and external ear normal. There is no impacted cerumen.     Nose: Nose normal.     Mouth/Throat:     Mouth: Mucous membranes are moist.  Eyes:     General: Lids are normal.     Extraocular Movements: Extraocular movements intact.     Conjunctiva/sclera: Conjunctivae normal.     Pupils: Pupils are equal, round, and reactive to light.     Funduscopic exam:    Right eye: No papilledema.        Left eye: No papilledema.  Neck:     Thyroid: No thyroid mass.     Vascular: No carotid bruit.  Cardiovascular:     Rate and Rhythm: Normal rate and regular rhythm.     Pulses: Normal pulses.     Heart sounds: Normal heart sounds. No murmur heard. Pulmonary:     Effort: Pulmonary effort is normal. No respiratory distress.     Breath sounds: Normal breath sounds. No wheezing.  Abdominal:     General: Abdomen is flat. Bowel sounds are normal. There is no distension.     Palpations: Abdomen is soft.     Tenderness: There is no abdominal tenderness.  Genitourinary:    Comments: Followed by GYN Musculoskeletal:        General: No swelling or  tenderness. Normal range of motion.     Cervical back: Full passive range of motion without pain, normal range of motion and neck supple.     Right lower leg: No edema.     Left lower leg: No edema.  Skin:    General: Skin is warm and dry.     Capillary Refill: Capillary refill takes less than 2 seconds.  Neurological:     General: No focal deficit present.     Mental Status: She is alert and oriented to person, place, and time.     Cranial Nerves: No cranial nerve deficit.     Sensory: No sensory deficit.  Psychiatric:        Mood and Affect: Mood normal.        Behavior:  Behavior normal.        Thought Content: Thought content normal.        Judgment: Judgment normal.        Assessment And Plan:     Encounter for annual health examination  Essential hypertension -     EKG 12-Lead -     POCT URINALYSIS DIP (CLINITEK) -     Microalbumin / creatinine urine ratio -     CMP14+EGFR  Prediabetes -     Hemoglobin A1c  Vitamin D  deficiency  Pain in both feet -     Ibuprofen ; Take 1 tablet (800 mg total) by mouth every 8 (eight) hours as needed.  Dispense: 30 tablet; Refill: 0  Class 3 severe obesity due to excess calories with body mass index (BMI) of 45.0 to 49.9 in adult, unspecified whether serious comorbidity present (HCC)  Influenza vaccination declined  Other long term (current) drug therapy -     CBC with Differential/Platelet  Encounter for screening  Encounter for lipid screening for cardiovascular disease -     Lipid panel  Chronic idiopathic constipation    Return for 1 year physical, 6 month bp check. Patient was given opportunity to ask questions. Patient verbalized understanding of the plan and was able to repeat key elements of the plan. All questions were answered to their satisfaction.   Gaines Ada, FNP  I, Gaines Ada, FNP, have reviewed all documentation for this visit. The documentation on 05/31/24 for the exam, diagnosis, procedures, and  orders are all accurate and complete.

## 2024-06-01 ENCOUNTER — Other Ambulatory Visit: Payer: Self-pay | Admitting: Nurse Practitioner

## 2024-06-01 DIAGNOSIS — Z1231 Encounter for screening mammogram for malignant neoplasm of breast: Secondary | ICD-10-CM

## 2024-06-01 LAB — CMP14+EGFR
ALT: 9 IU/L (ref 0–32)
AST: 9 IU/L (ref 0–40)
Albumin: 3.7 g/dL — ABNORMAL LOW (ref 3.9–4.9)
Alkaline Phosphatase: 75 IU/L (ref 41–116)
BUN/Creatinine Ratio: 13 (ref 9–23)
BUN: 10 mg/dL (ref 6–24)
Bilirubin Total: <0.2 mg/dL (ref 0.0–1.2)
CO2: 23 mmol/L (ref 20–29)
Calcium: 9.5 mg/dL (ref 8.7–10.2)
Chloride: 102 mmol/L (ref 96–106)
Creatinine, Ser: 0.77 mg/dL (ref 0.57–1.00)
Globulin, Total: 2.9 g/dL (ref 1.5–4.5)
Glucose: 80 mg/dL (ref 70–99)
Potassium: 3.8 mmol/L (ref 3.5–5.2)
Sodium: 138 mmol/L (ref 134–144)
Total Protein: 6.6 g/dL (ref 6.0–8.5)
eGFR: 96 mL/min/1.73 (ref >59)

## 2024-06-01 LAB — CBC WITH DIFFERENTIAL/PLATELET
Basophils Absolute: 0 x10E3/uL (ref 0.0–0.2)
Basos: 0 %
EOS (ABSOLUTE): 0.1 x10E3/uL (ref 0.0–0.4)
Eos: 1 %
Hematocrit: 37.2 % (ref 34.0–46.6)
Hemoglobin: 12.2 g/dL (ref 11.1–15.9)
Immature Grans (Abs): 0 x10E3/uL (ref 0.0–0.1)
Immature Granulocytes: 0 %
Lymphocytes Absolute: 3.5 x10E3/uL — ABNORMAL HIGH (ref 0.7–3.1)
Lymphs: 42 %
MCH: 27.1 pg (ref 26.6–33.0)
MCHC: 32.8 g/dL (ref 31.5–35.7)
MCV: 83 fL (ref 79–97)
Monocytes Absolute: 0.6 x10E3/uL (ref 0.1–0.9)
Monocytes: 7 %
Neutrophils Absolute: 4 x10E3/uL (ref 1.4–7.0)
Neutrophils: 50 %
Platelets: 337 x10E3/uL (ref 150–450)
RBC: 4.5 x10E6/uL (ref 3.77–5.28)
RDW: 13.8 % (ref 11.7–15.4)
WBC: 8.2 x10E3/uL (ref 3.4–10.8)

## 2024-06-01 LAB — MICROALBUMIN / CREATININE URINE RATIO
Creatinine, Urine: 91.6 mg/dL
Microalb/Creat Ratio: 4 mg/g{creat} (ref 0–29)
Microalbumin, Urine: 3.9 ug/mL

## 2024-06-01 LAB — LIPID PANEL
Chol/HDL Ratio: 2.7 ratio (ref 0.0–4.4)
Cholesterol, Total: 209 mg/dL — ABNORMAL HIGH (ref 100–199)
HDL: 78 mg/dL (ref >39)
LDL Chol Calc (NIH): 119 mg/dL — ABNORMAL HIGH (ref 0–99)
Triglycerides: 65 mg/dL (ref 0–149)
VLDL Cholesterol Cal: 12 mg/dL (ref 5–40)

## 2024-06-01 LAB — HEMOGLOBIN A1C
Est. average glucose Bld gHb Est-mCnc: 134 mg/dL
Hgb A1c MFr Bld: 6.3 % — ABNORMAL HIGH (ref 4.8–5.6)

## 2024-06-02 ENCOUNTER — Ambulatory Visit: Payer: Self-pay | Admitting: Nurse Practitioner

## 2024-06-02 DIAGNOSIS — K5904 Chronic idiopathic constipation: Secondary | ICD-10-CM

## 2024-06-02 LAB — URINE CULTURE

## 2024-06-03 ENCOUNTER — Encounter: Payer: Self-pay | Admitting: Nurse Practitioner

## 2024-06-03 ENCOUNTER — Other Ambulatory Visit: Payer: Self-pay | Admitting: Nurse Practitioner

## 2024-06-03 DIAGNOSIS — M79671 Pain in right foot: Secondary | ICD-10-CM | POA: Insufficient documentation

## 2024-06-03 DIAGNOSIS — E66813 Obesity, class 3: Secondary | ICD-10-CM | POA: Insufficient documentation

## 2024-06-03 DIAGNOSIS — K5904 Chronic idiopathic constipation: Secondary | ICD-10-CM

## 2024-06-03 MED ORDER — METFORMIN HCL ER (MOD) 1000 MG PO TB24: 1000.0000 mg | ORAL_TABLET | Freq: Every day | ORAL | 1 refills | Status: DC

## 2024-06-03 NOTE — Assessment & Plan Note (Signed)

## 2024-06-03 NOTE — Assessment & Plan Note (Addendum)
 Blood pressure controlled at 130/70 mmHg. Considered separating medications to reduce cost. - Continue olmesartan -hydrochlorothiazide  40-25 MG oral daily. - Inquire with pharmacy about cost of separating olmesartan  and hydrochlorothiazide . - Pick up next 90-day supply in November. - EKG done with NSR HR 77

## 2024-06-03 NOTE — Assessment & Plan Note (Signed)
 Challenges with weight management. Considered perimenopausal factors. - Encourage regular physical activity, including HIIT and strength training. - Consider joining a new exercise group or finding a consistent workout routine. - Discuss potential benefits of probiotics for gut health and weight management.

## 2024-06-03 NOTE — Assessment & Plan Note (Signed)
 Will check vitamin D  level and supplement as needed.    Also encouraged to spend 15 minutes in the sun daily.

## 2024-06-03 NOTE — Assessment & Plan Note (Signed)
 Chronic constipation with infrequent bowel movements. Discussed gut health and probiotics. - Consider using a probiotic.

## 2024-06-03 NOTE — Assessment & Plan Note (Signed)
-   Continue metformin  750 MG oral daily with breakfast. - Send message on December 29th to request a refill.

## 2024-06-03 NOTE — Assessment & Plan Note (Signed)
 Intermittent pain likely due to plantar fasciitis, exacerbated by inappropriate footwear. - Consider over-the-counter ibuprofen  for pain management. - Use a water bottle to roll under the foot for relief.

## 2024-06-15 ENCOUNTER — Other Ambulatory Visit: Payer: Self-pay | Admitting: Nurse Practitioner

## 2024-06-15 MED ORDER — PHENTERMINE HCL 15 MG PO CAPS: 15.0000 mg | ORAL_CAPSULE | ORAL | 0 refills | Status: DC

## 2024-06-17 ENCOUNTER — Encounter (HOSPITAL_COMMUNITY): Payer: Self-pay | Admitting: Surgery

## 2024-06-17 ENCOUNTER — Other Ambulatory Visit: Payer: Self-pay

## 2024-06-17 NOTE — Progress Notes (Signed)
 SDW CALL  Patient was given pre-op instructions over the phone. The opportunity was given for the patient to ask questions. No further questions asked. Patient verbalized understanding of instructions given.  Date & arrival time- June 21, 2024 @ 5:30 am.  PCP - Georgina Speaks, FNP Cardiologist -   PPM/ICD - denies Device Orders - n/a Rep Notified - n/a  Chest x-ray - denies EKG - 05-31-24 Stress Test - denies ECHO - denies Cardiac Cath - denies  Sleep Study - within the last year CPAP - none needed per patient  Fasting Blood Sugar - Does not check blood sugar metFORMIN  (GLUCOPHAGE ) hold day of procedure  Last dose of GLP1 agonist-  denies GLP1 instructions: n/a  Blood Thinner Instructions:denies Aspirin Instructions:denies  ERAS Protcol -NPO  COVID TEST-   Anesthesia review: no  Patient denies shortness of breath, fever, cough and chest pain over the phone call   All instructions explained to the patient, with a verbal understanding of the material. Patient agrees to go over the instructions while at home for a better understanding.

## 2024-06-20 NOTE — H&P (Signed)
 PROVIDER: VICENTA DASIE POLI, MD  MRN: [id number] DOB: 19-Feb-1976  Subjective   Chief Complaint: New Problem (lipoma inner thigh)   History of Present Illness: [name] is a 48 y.o. female who is seen for a right inner thigh mass.  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 She reports that she has had the slowly growing mass on her right inner thigh for several years. It is now getting larger and causing discomfort especially when walking and rubbing against the of the leg. She denies trauma to the area. She is otherwise without complaints.  Since the last time I saw her, her mammograms have been unremarkable and she no longer has any breast pain    Review of Systems: A complete review of systems was obtained from the patient. I have reviewed this information and discussed as appropriate with the patient. See HPI as well for other ROS.  ROS   Medical History: Past Medical History:  Diagnosis Date  Anemia  Arthritis  Hypertension   Patient Active Problem List  Diagnosis  Chronic pain of right knee  Class 2 obesity due to excess calories without serious comorbidity with body mass index (BMI) of 37.0 to 37.9 in adult  Closed nondisplaced fracture of lesser toe of right foot with routine healing  Essential hypertension  Hearing loss of left ear  Lichenification  Other constipation  Pneumonia of both lower lobes due to infectious organism  Right upper quadrant abdominal pain  S/P myomectomy  Situational depression  Vertigo  Chronic knee pain after total replacement of right knee joint   Past Surgical History:  Procedure Laterality Date  CESAREAN SECTION 07/2003  MYOMECTOMY ABDOMINAL 07/2015  CHOLECYSTECTOMY 09/2019  MASTECTOMY  Lumpectomy - 09/08/20    Allergies  Allergen Reactions  Hydrocodone -Acetaminophen  Nausea and Nausea And Vomiting  Codeine Itching   Current Outpatient Medications on  File Prior to Visit  Medication Sig Dispense Refill  cetirizine  (ZYRTEC ) 10 MG tablet Take 10 mg by mouth once daily  etonogestreL-ethinyl estradioL (NUVARING) 0.12-0.015 mg/24 hr vaginal ring etonogestrel 0.12 mg-ethinyl estradiol 0.015 mg/24 hr vaginal ring  lubiprostone  (AMITIZA ) 8 MCG capsule Take 8 mcg by mouth daily with breakfast  metFORMIN  (GLUCOPHAGE ) 1000 MG tablet Take 1,000 mg by mouth daily with breakfast  olmesartan -hydrochlorothiazide  (BENICAR  HCT) 40-25 mg tablet  phentermine  (ADIPEX-P ) 37.5 mg tablet Take 37.5 mg by mouth every morning before breakfast  scopolamine  (TRANSDERM-SCOP) 1 mg over 3 days patch Place 1 patch onto the skin every third day  tirzepatide  (ZEPBOUND ) 2.5 mg/0.5 mL pen injector Inject 2.5 mg subcutaneously every 7 (seven) days  albuterol  90 mcg/actuation inhaler INHALE 1-2 PUFFS BY MOUTH EVERY 6 HOURS AS NEEDED FOR WHEEZE OR SHORTNESS OF BREATH  dextromethorphan-guaifenesin  (MUCINEX  DM) 30-600 mg ER tablet Take 1 tablet by mouth 2 (two) times daily  oxyCODONE  (ROXICODONE ) 5 MG immediate release tablet TAKE 1 TABLET BY MOUTH EVERY 4 TO 6 HOURS AS NEEDED FOR PAIN  traMADoL  (ULTRAM ) 50 mg tablet TAKE 2 TABLETS BY MOUTH EVERY 6 HOURS AS NEEDED   No current facility-administered medications on file prior to visit.   Family History  Problem Relation Age of Onset  High blood pressure (Hypertension) Mother  Hyperlipidemia (Elevated cholesterol) Mother  High blood pressure (Hypertension) Father  Hyperlipidemia (Elevated cholesterol) Father  Diabetes Father    Social History   Tobacco Use  Smoking Status Never  Smokeless Tobacco Never    Social  History   Socioeconomic History  Marital status: Unknown  Tobacco Use  Smoking status: Never  Smokeless tobacco: Never  Vaping Use  Vaping status: Never Used  Substance and Sexual Activity  Alcohol use: Never  Drug use: Never  Sexual activity: Defer   Social Drivers of Health   Financial Resource  Strain: Low Risk (12/11/2022)  Received from [facility]  Overall Financial Resource Strain (CARDIA)  Difficulty of Paying Living Expenses: Not hard at all  Food Insecurity: No Food Insecurity (12/11/2022)  Received from [facility]  Hunger Vital Sign  Within the past 12 months, you worried that your food would run out before you got the money to buy more.: Never true  Within the past 12 months, the food you bought just didn't last and you didn't have money to get more.: Never true  Transportation Needs: No Transportation Needs (12/11/2022)  Received from [facility] - Transportation  Lack of Transportation (Medical): No  Lack of Transportation (Non-Medical): No  Physical Activity: Insufficiently Active (12/11/2022)  Received from [facility]  Exercise Vital Sign  On average, how many days per week do you engage in moderate to strenuous exercise (like a brisk walk)?: 2 days  On average, how many minutes do you engage in exercise at this level?: 50 min  Stress: No Stress Concern Present (12/11/2022)  Received from [facility] - Occupational Stress Questionnaire  Feeling of Stress : Only a little  Social Connections: Moderately Integrated (12/11/2022)  Received from [facility]  Social Connection and Isolation Panel  In a typical week, how many times do you talk on the phone with family, friends, or neighbors?: More than three times a week  How often do you get together with friends or relatives?: More than three times a week  How often do you attend church or religious services?: More than 4 times per year  Do you belong to any clubs or organizations such as church groups, unions, fraternal or athletic groups, or school groups?: Yes  How often do you attend meetings of the clubs or organizations you belong to?: More than 4 times per year  Are you married, widowed, divorced, separated, never married, or living with a partner?: Never married    Objective:   Vitals:   BP: 136/89  Pulse: 76  Temp: 36.7 C (98 F)  TempSrc: Temporal  SpO2: 98%  Weight: (!) 112.8 kg (248 lb 9.6 oz)  Height: 163.8 cm (5' 4.5)  PainSc: 0-No pain   Body mass index is 42.01 kg/m.  Physical Exam   She appears well on exam  On her right inner thigh there is a 7 cm mobile mass. There are no current skin changes. There is no right inguinal lymphadenopathy. The mass is nonpulsatile  Labs, Imaging and Diagnostic Testing:  I have reviewed her notes in the electronic medical records  Assessment and Plan:   Diagnoses and all orders for this visit:  Mass of right lower leg    This is a right lower leg mass of uncertain etiology on the right medial thigh. This may represent a lipoma but I cannot rule out a desmoid tumor. Because it is symptomatic and enlarging, surgical excision is recommended. Again the mass measures 7 cm. I explained the surgical procedure in detail. We discussed the risk which includes but is not limited to bleeding, infection, injury to surrounding structures, recurrence of the mass, the need for further procedures of malignancy is  found, seroma formation, cardiopulmonary issues with anesthesia, postoperative recovery, etc. She understands and wished to proceed with surgery which will be scheduled

## 2024-06-20 NOTE — Progress Notes (Signed)
--------  [facility] PROCEDURAL EXPEDITER NOTE------------  Patient Name: MUNTAHA VERMETTE Patient DOB: Jan 16, 1976 Today's Date: 06/20/24   Chart reviewed:  Yes  Documentation gaps: N Orders in place:  Yes  Communication with surgical team if no orders: N/A Labs, test, and orders reviewed: Y Anesthesia Order Set placed. Pt had labs done on 05/31/24 Requires surgical clearance:  No Barriers noted:N/A  Intervention provided by [facility] team: N/A Barrier resolved: N/A   Devere Penner, RN  [facility] Expeditor

## 2024-06-21 ENCOUNTER — Other Ambulatory Visit: Payer: Self-pay

## 2024-06-21 ENCOUNTER — Ambulatory Visit (HOSPITAL_COMMUNITY): Admitting: Certified Registered Nurse Anesthetist

## 2024-06-21 ENCOUNTER — Encounter (HOSPITAL_COMMUNITY)
Admission: RE | Disposition: A | Discharge status: Home / Self Care | Payer: Self-pay | Source: Home / Self Care | Attending: Surgery

## 2024-06-21 ENCOUNTER — Encounter (HOSPITAL_COMMUNITY): Payer: Self-pay | Admitting: Surgery

## 2024-06-21 ENCOUNTER — Ambulatory Visit (HOSPITAL_COMMUNITY)
Admission: RE | Admit: 2024-06-21 | Discharge: 2024-06-21 | Disposition: A | Discharge status: Home / Self Care | Attending: Surgery | Admitting: Surgery

## 2024-06-21 DIAGNOSIS — D1723 Benign lipomatous neoplasm of skin and subcutaneous tissue of right leg: Secondary | ICD-10-CM | POA: Diagnosis not present

## 2024-06-21 DIAGNOSIS — Z01818 Encounter for other preprocedural examination: Secondary | ICD-10-CM

## 2024-06-21 DIAGNOSIS — R2241 Localized swelling, mass and lump, right lower limb: Secondary | ICD-10-CM | POA: Diagnosis present

## 2024-06-21 DIAGNOSIS — I10 Essential (primary) hypertension: Secondary | ICD-10-CM | POA: Insufficient documentation

## 2024-06-21 HISTORY — DX: Other specified postprocedural states: Z98.890

## 2024-06-21 HISTORY — PX: EXCISION MASS LOWER EXTREMETIES: SHX6705

## 2024-06-21 HISTORY — DX: Prediabetes: R73.03

## 2024-06-21 HISTORY — DX: Nausea with vomiting, unspecified: R11.2

## 2024-06-21 LAB — BASIC METABOLIC PANEL WITH GFR
Anion gap: 9 (ref 5–15)
BUN: 14 mg/dL (ref 6–20)
CO2: 23 mmol/L (ref 22–32)
Calcium: 8.8 mg/dL — ABNORMAL LOW (ref 8.9–10.3)
Chloride: 108 mmol/L (ref 98–111)
Creatinine, Ser: 0.75 mg/dL (ref 0.44–1.00)
GFR, Estimated: >60 mL/min (ref >60)
Glucose, Bld: 109 mg/dL — ABNORMAL HIGH (ref 70–99)
Potassium: 3.7 mmol/L (ref 3.5–5.1)
Sodium: 140 mmol/L (ref 135–145)

## 2024-06-21 LAB — CBC
HCT: 34.7 % — ABNORMAL LOW (ref 36.0–46.0)
Hemoglobin: 11.4 g/dL — ABNORMAL LOW (ref 12.0–15.0)
MCH: 27 pg (ref 26.0–34.0)
MCHC: 32.9 g/dL (ref 30.0–36.0)
MCV: 82.2 fL (ref 80.0–100.0)
Platelets: 282 K/uL (ref 150–400)
RBC: 4.22 MIL/uL (ref 3.87–5.11)
RDW: 13.7 % (ref 11.5–15.5)
WBC: 7.9 K/uL (ref 4.0–10.5)
nRBC: 0 % (ref 0.0–0.2)

## 2024-06-21 SURGERY — EXCISION MASS LOWER EXTREMITIES
Anesthesia: General | Site: Thigh | Laterality: Right

## 2024-06-21 MED ORDER — TRAMADOL HCL 50 MG PO TABS: 50.0000 mg | ORAL_TABLET | Freq: Four times a day (QID) | ORAL | 0 refills | Status: AC | PRN

## 2024-06-21 MED ORDER — LIDOCAINE 2% (20 MG/ML) 5 ML SYRINGE
INTRAMUSCULAR | Status: DC | PRN
  Administered 2024-06-21: 80 mg via INTRAVENOUS

## 2024-06-21 MED ORDER — PROPOFOL 10 MG/ML IV BOLUS
INTRAVENOUS | Status: AC
  Filled 2024-06-21: qty 20

## 2024-06-21 MED ORDER — BUPIVACAINE-EPINEPHRINE (PF) 0.25% -1:200000 IJ SOLN
INTRAMUSCULAR | Status: AC
Start: 2024-06-21 — End: 2024-06-21
  Filled 2024-06-21: qty 30

## 2024-06-21 MED ORDER — MIDAZOLAM HCL (PF) 2 MG/2ML IJ SOLN
INTRAMUSCULAR | Status: DC | PRN
  Administered 2024-06-21: 2 mg via INTRAVENOUS

## 2024-06-21 MED ORDER — PROPOFOL 10 MG/ML IV BOLUS
INTRAVENOUS | Status: DC | PRN
Start: 2024-06-21 — End: 2024-06-21
  Administered 2024-06-21: 180 mg via INTRAVENOUS

## 2024-06-21 MED ORDER — ONDANSETRON HCL 4 MG/2ML IJ SOLN
INTRAMUSCULAR | Status: DC | PRN
  Administered 2024-06-21: 4 mg via INTRAVENOUS

## 2024-06-21 MED ORDER — MIDAZOLAM HCL 2 MG/2ML IJ SOLN
INTRAMUSCULAR | Status: AC
Start: 2024-06-21 — End: 2024-06-21
  Filled 2024-06-21: qty 2

## 2024-06-21 MED ORDER — DROPERIDOL 2.5 MG/ML IJ SOLN: 0.6250 mg | Freq: Once | INTRAMUSCULAR | Status: DC | PRN

## 2024-06-21 MED ORDER — CHLORHEXIDINE GLUCONATE CLOTH 2 % EX PADS: 6.0000 | MEDICATED_PAD | Freq: Once | CUTANEOUS | Status: DC

## 2024-06-21 MED ORDER — ORAL CARE MOUTH RINSE: 15.0000 mL | Freq: Once | OROMUCOSAL | Status: AC

## 2024-06-21 MED ORDER — PROPOFOL 500 MG/50ML IV EMUL
INTRAVENOUS | Status: DC | PRN
  Administered 2024-06-21: 125 ug/kg/min via INTRAVENOUS

## 2024-06-21 MED ORDER — BUPIVACAINE-EPINEPHRINE 0.25% -1:200000 IJ SOLN
INTRAMUSCULAR | Status: DC | PRN
  Administered 2024-06-21: 30 mL

## 2024-06-21 MED ORDER — CHLORHEXIDINE GLUCONATE CLOTH 2 % EX PADS
6.0000 | MEDICATED_PAD | Freq: Once | CUTANEOUS | Status: AC
  Administered 2024-06-21: 6 via TOPICAL

## 2024-06-21 MED ORDER — TRAMADOL HCL 50 MG PO TABS
50.0000 mg | ORAL_TABLET | Freq: Once | ORAL | Status: AC
  Administered 2024-06-21: 50 mg via ORAL

## 2024-06-21 MED ORDER — TRAMADOL HCL 50 MG PO TABS
ORAL_TABLET | ORAL | Status: AC
  Filled 2024-06-21: qty 1

## 2024-06-21 MED ORDER — FENTANYL CITRATE (PF) 250 MCG/5ML IJ SOLN
INTRAMUSCULAR | Status: DC | PRN
Start: 2024-06-21 — End: 2024-06-21
  Administered 2024-06-21 (×2): 50 ug via INTRAVENOUS

## 2024-06-21 MED ORDER — CEFAZOLIN SODIUM-DEXTROSE 2-4 GM/100ML-% IV SOLN
2.0000 g | INTRAVENOUS | Status: AC
  Administered 2024-06-21: 2 g via INTRAVENOUS
  Filled 2024-06-21: qty 100

## 2024-06-21 MED ORDER — FENTANYL CITRATE (PF) 250 MCG/5ML IJ SOLN
INTRAMUSCULAR | Status: AC
  Filled 2024-06-21: qty 5

## 2024-06-21 MED ORDER — SCOPOLAMINE 1 MG/3DAYS TD PT72
1.0000 | MEDICATED_PATCH | TRANSDERMAL | Status: DC
  Administered 2024-06-21: 1 via TRANSDERMAL

## 2024-06-21 MED ORDER — ACETAMINOPHEN 500 MG PO TABS
1000.0000 mg | ORAL_TABLET | ORAL | Status: AC
  Administered 2024-06-21: 1000 mg via ORAL
  Filled 2024-06-21: qty 2

## 2024-06-21 MED ORDER — 0.9 % SODIUM CHLORIDE (POUR BTL) OPTIME
TOPICAL | Status: DC | PRN
  Administered 2024-06-21: 1000 mL

## 2024-06-21 MED ORDER — CHLORHEXIDINE GLUCONATE 0.12 % MT SOLN
15.0000 mL | Freq: Once | OROMUCOSAL | Status: AC
  Administered 2024-06-21: 15 mL via OROMUCOSAL
  Filled 2024-06-21: qty 15

## 2024-06-21 MED ORDER — DEXAMETHASONE SODIUM PHOSPHATE 4 MG/ML IJ SOLN
INTRAMUSCULAR | Status: DC | PRN
  Administered 2024-06-21: 5 mg via INTRAVENOUS

## 2024-06-21 MED ORDER — FENTANYL CITRATE (PF) 100 MCG/2ML IJ SOLN: 25.0000 ug | INTRAMUSCULAR | Status: DC | PRN

## 2024-06-21 MED ORDER — LACTATED RINGERS IV SOLN: INTRAVENOUS | Status: DC

## 2024-06-21 SURGICAL SUPPLY — 25 items
BAG COUNTER SPONGE SURGICOUNT (BAG) ×1 IMPLANT
CANISTER SUCTION 3000ML PPV (SUCTIONS) IMPLANT
COVER SURGICAL LIGHT HANDLE (MISCELLANEOUS) ×1 IMPLANT
DERMABOND ADVANCED .7 DNX12 (GAUZE/BANDAGES/DRESSINGS) ×1 IMPLANT
DRAPE LAPAROSCOPIC ABDOMINAL (DRAPES) IMPLANT
DRAPE LAPAROTOMY 100X72 PEDS (DRAPES) IMPLANT
DRSG TEGADERM 4X4.75 (GAUZE/BANDAGES/DRESSINGS) ×1 IMPLANT
ELECTRODE REM PT RTRN 9FT ADLT (ELECTROSURGICAL) ×1 IMPLANT
GAUZE SPONGE 4X4 12PLY STRL (GAUZE/BANDAGES/DRESSINGS) ×1 IMPLANT
GLOVE SURG SIGNA 7.5 PF LTX (GLOVE) ×1 IMPLANT
GOWN STRL REUS W/ TWL LRG LVL3 (GOWN DISPOSABLE) ×1 IMPLANT
GOWN STRL REUS W/ TWL XL LVL3 (GOWN DISPOSABLE) ×1 IMPLANT
KIT BASIN OR (CUSTOM PROCEDURE TRAY) ×1 IMPLANT
KIT TURNOVER KIT B (KITS) ×1 IMPLANT
NDL HYPO 25GX1X1/2 BEV (NEEDLE) ×1 IMPLANT
NEEDLE HYPO 25GX1X1/2 BEV (NEEDLE) ×1 IMPLANT
PACK GENERAL/GYN (CUSTOM PROCEDURE TRAY) ×1 IMPLANT
PAD ARMBOARD POSITIONER FOAM (MISCELLANEOUS) ×1 IMPLANT
PENCIL SMOKE EVACUATOR (MISCELLANEOUS) ×1 IMPLANT
SOLN 0.9% NACL POUR BTL 1000ML (IV SOLUTION) ×1 IMPLANT
SUT MNCRL AB 4-0 PS2 18 (SUTURE) ×1 IMPLANT
SUT VIC AB 3-0 SH 27XBRD (SUTURE) ×1 IMPLANT
SYR CONTROL 10ML LL (SYRINGE) ×1 IMPLANT
TOWEL GREEN STERILE (TOWEL DISPOSABLE) ×1 IMPLANT
TOWEL GREEN STERILE FF (TOWEL DISPOSABLE) ×1 IMPLANT

## 2024-06-21 NOTE — Anesthesia Preprocedure Evaluation (Signed)
 Anesthesia Evaluation  Patient identified by MRN, date of birth, ID band Patient awake    Reviewed: Allergy & Precautions, NPO status , Patient's Chart, lab work & pertinent test results  History of Anesthesia Complications (+) PONV and history of anesthetic complications  Airway Mallampati: III  TM Distance: >3 FB Neck ROM: Full    Dental no notable dental hx. (+) Teeth Intact   Pulmonary neg pulmonary ROS, neg sleep apnea, neg COPD, Patient abstained from smoking.Not current smoker   Pulmonary exam normal breath sounds clear to auscultation       Cardiovascular Exercise Tolerance: Good METShypertension, Pt. on medications (-) CAD and (-) Past MI (-) dysrhythmias  Rhythm:Regular Rate:Normal - Systolic murmurs    Neuro/Psych  PSYCHIATRIC DISORDERS  Depression    negative neurological ROS     GI/Hepatic ,neg GERD  ,,(+)     (-) substance abuse    Endo/Other  neg diabetes    Renal/GU negative Renal ROS     Musculoskeletal   Abdominal  (+) + obese  Peds  Hematology   Anesthesia Other Findings Past Medical History: 09/11/2018: Acute bronchitis 09/11/2018: Acute calculous cholecystitis 09/11/2018: Acute dermatitis 09/11/2018: Acute maxillary sinusitis 09/11/2018: Acute urinary tract infection No date: Anemia 10/12/2018: Closed nondisplaced fracture of lesser toe of right foot  with routine healing 12/11/2022: Frequent urination 09/11/2018: Herpes simplex of female genitalia No date: Hyperlipidemia     Comment:  patient denies No date: Hypertension No date: Mass     Comment:  on rt inner thigh 10/12/2018: Pneumonia of both lower lobes due to infectious organism No date: PONV (postoperative nausea and vomiting) No date: Pre-diabetes 09/18/2018: Right upper quadrant abdominal pain 07/20/2015: S/P myomectomy 02/15/2019: Situational depression 09/11/2018: Trichomonal vaginitis  Reproductive/Obstetrics                               Anesthesia Physical Anesthesia Plan  ASA: 2  Anesthesia Plan: General   Post-op Pain Management: Tylenol  PO (pre-op)* and Toradol  IV (intra-op)*   Induction: Intravenous  PONV Risk Score and Plan: 4 or greater and Ondansetron , Dexamethasone , Midazolam , TIVA, Propofol  infusion and Scopolamine  patch - Pre-op  Airway Management Planned: LMA  Additional Equipment: None  Intra-op Plan:   Post-operative Plan: Extubation in OR  Informed Consent: I have reviewed the patients History and Physical, chart, labs and discussed the procedure including the risks, benefits and alternatives for the proposed anesthesia with the patient or authorized representative who has indicated his/her understanding and acceptance.     Dental advisory given  Plan Discussed with: CRNA and Surgeon  Anesthesia Plan Comments: (Discussed risks of anesthesia with patient, including PONV, sore throat, lip/dental/eye damage. Rare risks discussed as well, such as cardiorespiratory and neurological sequelae, and allergic reactions. Discussed the role of CRNA in patient's perioperative care. Patient understands.)        Anesthesia Quick Evaluation

## 2024-06-21 NOTE — Interval H&P Note (Signed)
 History and Physical Interval Note:no change in H and P  06/21/2024 7:09 AM  [name]  has presented today for surgery, with the diagnosis of RIGHT LEG MASS 7CM.  The various methods of treatment have been discussed with the patient and family. After consideration of risks, benefits and other options for treatment, the patient has consented to  Procedure(s) with comments: EXCISION MASS LOWER EXTREMITIES (Right) - LATERALITY: RIGHT as a surgical intervention.  The patient's history has been reviewed, patient examined, no change in status, stable for surgery.  I have reviewed the patient's chart and labs.  Questions were answered to the patient's satisfaction.     [name]

## 2024-06-21 NOTE — Anesthesia Postprocedure Evaluation (Signed)
 Anesthesia Post Note  Patient: [name]  Procedure(s) Performed: EXCISION MASS LOWER EXTREMITIES (Right: Thigh)     Patient location during evaluation: PACU Anesthesia Type: General Level of consciousness: awake and alert Pain management: pain level controlled Vital Signs Assessment: post-procedure vital signs reviewed and stable Respiratory status: spontaneous breathing, nonlabored ventilation, respiratory function stable and patient connected to nasal cannula oxygen Cardiovascular status: blood pressure returned to baseline and stable Postop Assessment: no apparent nausea or vomiting Anesthetic complications: no   No notable events documented.  Last Vitals:  Vitals:   06/21/24 0830 06/21/24 0845  BP: 124/75 129/72  Pulse: 76 69  Resp: 19 18  Temp:  36.7 C  SpO2: 93% 95%    Last Pain:  Vitals:   06/21/24 0845  TempSrc:   PainSc: Asleep                 Rome Ade

## 2024-06-21 NOTE — Op Note (Signed)
   ANEA FODERA 06/21/2024   Pre-op Diagnosis: RIGHT THIGH MASS 7CM     Post-op Diagnosis: SAME  Procedure(s): EXCISION 7 CM RIGHT THIGH MASS  Surgeon(s): Vernetta Berg, MD Cooper Ochs, MD [facility] Resident  Anesthesia: Monitor Anesthesia Care  Staff:  Circulator: Veda Verla SAILOR, RN Relief Circulator: Bridgett Waddell PARAS, RN Scrub Person: Orvil Asberry POUR  Estimated Blood Loss: Minimal               Specimens: sent to path  Findings: The patient was found to have a 7 cm right medial thigh mass consistent with a lipoma.  It was sent to pathology for evaluation.  Procedure: The patient was brought to the operating room and identified as correct patient.  She was placed upon the operating table and general anesthesia was induced.  Her right leg was then frog-legged.  The medial thigh was then prepped and draped over the visible mass in the usual sterile fashion.  The skin was anesthetized with Marcaine .  An elliptical incision was then made around the mass with a scalpel.  The overlying skin was then excised.  We then dissected to the mass which appeared consistent with a complex lipoma.  It was excised piecemeal with the electrocautery and then sent to pathology for evaluation.  No other masses were identified.  Hemostasis was achieved with the cautery.  The wound was injected further with Marcaine .  The subcutaneous tissue was then closed with interrupted 3-0 Vicryl sutures and the skin was closed with a running 4-0 Monocryl.  Dermabond was then applied.  The patient tolerated the procedure well.  All the counts were correct at the end of the procedure.  The patient was then extubated in the operating room and taken in stable condition to the recovery room.          Berg Vernetta   Date: 06/21/2024  Time: 7:57 AM

## 2024-06-21 NOTE — Transfer of Care (Signed)
 Immediate Anesthesia Transfer of Care Note  Patient: [name]  Procedure(s) Performed: EXCISION MASS LOWER EXTREMITIES (Right: Thigh)  Patient Location: PACU  Anesthesia Type:General  Level of Consciousness: awake  Airway & Oxygen Therapy: Patient Spontanous Breathing  Post-op Assessment: Report given to RN  Post vital signs: stable  Last Vitals:  Vitals Value Taken Time  BP 129/71 06/21/24 08:09  Temp    Pulse 93 06/21/24 08:14  Resp 22 06/21/24 08:14  SpO2 95 % 06/21/24 08:14  Vitals shown include unfiled device data.  Last Pain:  Vitals:   06/21/24 0622  TempSrc:   PainSc: 0-No pain         Complications: No notable events documented.

## 2024-06-21 NOTE — Anesthesia Procedure Notes (Signed)
 Procedure Name: LMA Insertion Date/Time: 06/21/2024 7:38 AM  Performed by: Lamar Lucie DASEN, CRNAPatient Re-evaluated:Patient Re-evaluated prior to induction Oxygen Delivery Method: Circle system utilized Preoxygenation: Pre-oxygenation with 100% oxygen Induction Type: IV induction LMA: LMA inserted LMA Size: 5.0 Tube secured with: Tape

## 2024-06-21 NOTE — Discharge Instructions (Addendum)
 You may shower starting tomorrow  Ice pack, tylenol , and ibuprofen  also for pain  Ok to walk today and drive tomorrow  No vigorous activity for one week

## 2024-06-22 ENCOUNTER — Encounter (HOSPITAL_COMMUNITY): Payer: Self-pay | Admitting: Surgery

## 2024-06-24 LAB — SURGICAL PATHOLOGY

## 2024-07-08 ENCOUNTER — Encounter: Payer: Self-pay | Admitting: Family Medicine

## 2024-07-08 ENCOUNTER — Ambulatory Visit: Admitting: Family Medicine

## 2024-07-08 VITALS — BP 122/80 | HR 85 | Temp 98.2°F | Ht 64.5 in | Wt 251.0 lb

## 2024-07-08 DIAGNOSIS — R0981 Nasal congestion: Secondary | ICD-10-CM

## 2024-07-08 DIAGNOSIS — J029 Acute pharyngitis, unspecified: Secondary | ICD-10-CM

## 2024-07-08 DIAGNOSIS — R058 Other specified cough: Secondary | ICD-10-CM

## 2024-07-08 LAB — POC COVID19/FLU A&B COMBO
Covid Antigen, POC: NEGATIVE
Influenza A Antigen, POC: NEGATIVE
Influenza B Antigen, POC: NEGATIVE

## 2024-07-08 MED ORDER — CROMOLYN SODIUM 5.2 MG/ACT NA AERS: 1.0000 | INHALATION_SPRAY | Freq: Every day | NASAL | 1 refills | Status: AC

## 2024-07-08 MED ORDER — TRIAMCINOLONE ACETONIDE 40 MG/ML IJ SUSP
60.0000 mg | Freq: Once | INTRAMUSCULAR | Status: AC
  Administered 2024-07-08: 60 mg via INTRAMUSCULAR

## 2024-07-08 MED ORDER — BENZONATATE 100 MG PO CAPS: 100.0000 mg | ORAL_CAPSULE | Freq: Three times a day (TID) | ORAL | 0 refills | Status: AC | PRN

## 2024-07-08 NOTE — Progress Notes (Signed)
 I,Jameka J Llittleton, CMA,acting as a neurosurgeon for [employer], NP.,have documented all relevant documentation on the behalf of Bruna Creighton, NP,as directed by  Bruna Creighton, NP while in the presence of Bruna Creighton, NP.  Subjective:  Patient ID: [name] , female    DOB: June 03, 1976 , 48 y.o.   MRN: [id number]  Chief Complaint  Patient presents with   URI    Patient presents today for cold symptoms. She has been feeling sick since Monday. Patient reports she has sore throat,cough, fatigue, nasal and chest congestion. She reports her mucus is lime green. She reports as the day goes on she feels worse.     HPI Discussed the use of AI scribe software for clinical note transcription with the patient, who gave verbal consent to proceed.  History of Present Illness      [name] is a 48 year old female who presents with upper respiratory symptoms.  She has been experiencing upper respiratory symptoms since Monday evening, beginning with a sore throat. By Tuesday, she developed nasal congestion and began using Mucinex  and Alka-Seltzer. On Wednesday, she started coughing and experiencing chest congestion, with the cough producing green mucus.  No body aches, fever, or chills. Both her cough and nasal discharge are colored. No shortness of breath, but she feels pressure and fullness in her head. She wants to 'just lay around and not do anything.'  She did not sleep well the previous night due to coughing, although she had been sleeping fine on other nights. She has been using home remedies such as ginger and garlic to alleviate symptoms.  She is prediabetic and takes metformin .  She has a history of COVID-19 and flu, but her current tests for these infections are negative. Her cough is persistent, with a tickling sensation in her throat and postnasal drip.      Past Medical History:  Diagnosis Date   Acute bronchitis 09/11/2018   Acute calculous cholecystitis 09/11/2018   Acute  dermatitis 09/11/2018   Acute maxillary sinusitis 09/11/2018   Acute urinary tract infection 09/11/2018   Anemia    Closed nondisplaced fracture of lesser toe of right foot with routine healing 10/12/2018   Frequent urination 12/11/2022   Herpes simplex of female genitalia 09/11/2018   Hyperlipidemia    patient denies   Hypertension    Mass    on rt inner thigh   Pneumonia of both lower lobes due to infectious organism 10/12/2018   PONV (postoperative nausea and vomiting)    Pre-diabetes    Right upper quadrant abdominal pain 09/18/2018   S/P myomectomy 07/20/2015   Situational depression 02/15/2019   Trichomonal vaginitis 09/11/2018     Family History  Problem Relation Age of Onset   COPD Mother    Healthy Father    Breast cancer Neg Hx      Current Outpatient Medications:    benzonatate  (TESSALON ) 100 MG capsule, Take 1 capsule (100 mg total) by mouth 3 (three) times daily as needed for cough., Disp: 30 capsule, Rfl: 0   cetirizine  (ZYRTEC  ALLERGY) 10 MG tablet, Take 1 tablet (10 mg total) by mouth daily., Disp: 30 tablet, Rfl: 0   cromolyn  (NASALCROM ) 5.2 MG/ACT nasal spray, Place 1 spray into both nostrils daily., Disp: 26 mL, Rfl: 1   ibuprofen  (ADVIL ) 800 MG tablet, Take 1 tablet (800 mg total) by mouth every 8 (eight) hours as needed., Disp: 30 tablet, Rfl: 0   lubiprostone  (AMITIZA ) 8 MCG capsule, Take  1 capsule (8 mcg total) by mouth daily with breakfast., Disp: 30 capsule, Rfl: 2   metFORMIN  (GLUCOPHAGE ) 1000 MG tablet, Take 1 tablet (1,000 mg total) by mouth daily with breakfast., Disp: 90 tablet, Rfl: 1   NUVARING 0.12-0.015 MG/24HR vaginal ring, Place 1 application vaginally every 30 (thirty) days., Disp: , Rfl: 11   olmesartan -hydrochlorothiazide  (BENICAR  HCT) 40-25 MG tablet, TAKE 1 TABLET BY MOUTH EVERY DAY, Disp: 90 tablet, Rfl: 1   phentermine  15 MG capsule, Take 1 capsule (15 mg total) by mouth every morning., Disp: 30 capsule, Rfl: 0   scopolamine   (TRANSDERM-SCOP) 1 MG/3DAYS, Place 1 patch (1.5 mg total) onto the skin every 3 (three) days., Disp: 10 patch, Rfl: 0   traMADol  (ULTRAM ) 50 MG tablet, Take 1 tablet (50 mg total) by mouth every 6 (six) hours as needed for moderate pain (pain score 4-6) or severe pain (pain score 7-10)., Disp: 25 tablet, Rfl: 0   Allergies  Allergen Reactions   Hydrocodone -Acetaminophen  Nausea And Vomiting   Codeine Itching   Other Nausea And Vomiting    Anesthesia with egg cause vomiting     Review of Systems  Constitutional:  Positive for fatigue.  HENT:  Positive for congestion and postnasal drip.   Respiratory:  Positive for cough.      Today's Vitals   07/08/24 1545  BP: 122/80  Pulse: 85  Temp: 98.2 F (36.8 C)  TempSrc: Oral  Weight: 251 lb (113.9 kg)  Height: 5' 4.5 (1.638 m)  PainSc: 0-No pain   Body mass index is 42.42 kg/m.  Wt Readings from Last 3 Encounters:  07/08/24 251 lb (113.9 kg)  06/21/24 225 lb (102.1 kg)  05/31/24 255 lb 9.6 oz (115.9 kg)    The 10-year ASCVD risk score (Arnett DK, et al., 2019) is: 1.3%   Values used to calculate the score:     Age: 42 years     Clinically relevant sex: Female     Is Non-Hispanic African American: Yes     Diabetic: No     Tobacco smoker: No     Systolic Blood Pressure: 122 mmHg     Is BP treated: Yes     HDL Cholesterol: 78 mg/dL     Total Cholesterol: 209 mg/dL  Objective:  Physical Exam Constitutional:      Appearance: Normal appearance.  HENT:     Head: Normocephalic.  Cardiovascular:     Rate and Rhythm: Normal rate and regular rhythm.     Pulses: Normal pulses.     Heart sounds: Normal heart sounds.  Pulmonary:     Effort: Pulmonary effort is normal.     Breath sounds: Normal breath sounds.  Abdominal:     General: Bowel sounds are normal.  Neurological:     Mental Status: She is alert.         Assessment And Plan:   Assessment & Plan Sore throat COVID/Flu test negative Nasal congestion - Prescribe  nasal spray to aid breathing. Bronchitis  Negative COVID test. Differential includes viral versus bacterial infection, but evidence supports viral cause. - Administered steroid injection to alleviate cough and improve lung function. - Prescribed Tessalon  for cough management.      Return if symptoms worsen or fail to improve, for keep next appt.  Patient was given opportunity to ask questions. Patient verbalized understanding of the plan and was able to repeat key elements of the plan. All questions were answered to their satisfaction.    I,  Bruna Creighton, NP, have reviewed all documentation for this visit. The documentation on 07/18/2024 for the exam, diagnosis, procedures, and orders are all accurate and complete.    IF YOU HAVE BEEN REFERRED TO A SPECIALIST, IT MAY TAKE 1-2 WEEKS TO SCHEDULE/PROCESS THE REFERRAL. IF YOU HAVE NOT HEARD FROM US /SPECIALIST IN TWO WEEKS, PLEASE GIVE US  A CALL AT [phone].

## 2024-07-16 ENCOUNTER — Inpatient Hospital Stay
Admission: RE | Admit: 2024-07-16 | Discharge: 2024-07-16 | Attending: Nurse Practitioner | Admitting: Nurse Practitioner

## 2024-07-16 DIAGNOSIS — Z1231 Encounter for screening mammogram for malignant neoplasm of breast: Secondary | ICD-10-CM

## 2024-07-19 ENCOUNTER — Ambulatory Visit: Payer: Self-pay | Admitting: Family Medicine

## 2024-07-19 DIAGNOSIS — J029 Acute pharyngitis, unspecified: Secondary | ICD-10-CM | POA: Insufficient documentation

## 2024-07-19 DIAGNOSIS — R0981 Nasal congestion: Secondary | ICD-10-CM | POA: Insufficient documentation

## 2024-07-19 DIAGNOSIS — J40 Bronchitis, not specified as acute or chronic: Secondary | ICD-10-CM | POA: Insufficient documentation

## 2024-07-19 NOTE — Assessment & Plan Note (Signed)
 Negative COVID test. Differential includes viral versus bacterial infection, but evidence supports viral cause. - Administered steroid injection to alleviate cough and improve lung function. - Prescribed Tessalon  for cough management.

## 2024-07-19 NOTE — Assessment & Plan Note (Signed)
 COVID/Flu test negative

## 2024-07-19 NOTE — Assessment & Plan Note (Signed)
-   Prescribe nasal spray to aid breathing.

## 2024-07-22 ENCOUNTER — Other Ambulatory Visit: Payer: Self-pay | Admitting: Nurse Practitioner

## 2024-07-22 ENCOUNTER — Other Ambulatory Visit: Payer: Self-pay | Admitting: Family Medicine

## 2024-07-22 MED ORDER — AZITHROMYCIN 250 MG PO TABS: ORAL_TABLET | ORAL | 0 refills | Status: DC

## 2024-07-22 MED ORDER — AZITHROMYCIN 250 MG PO TABS: ORAL_TABLET | ORAL | 0 refills | Status: AC

## 2024-07-22 MED ORDER — CETIRIZINE HCL 10 MG PO TABS: 10.0000 mg | ORAL_TABLET | Freq: Every day | ORAL | 0 refills | Status: AC

## 2024-07-22 MED ORDER — PHENTERMINE HCL 37.5 MG PO CAPS: 37.5000 mg | ORAL_CAPSULE | ORAL | 1 refills | Status: AC

## 2024-07-22 NOTE — Addendum Note (Signed)
 Addended byBETHA CREIGHTON, Jolie Strohecker E on: 07/22/2024 02:31 PM   Modules accepted: Orders

## 2024-07-23 ENCOUNTER — Other Ambulatory Visit: Payer: Self-pay | Admitting: Nurse Practitioner

## 2024-07-23 DIAGNOSIS — R928 Other abnormal and inconclusive findings on diagnostic imaging of breast: Secondary | ICD-10-CM

## 2024-07-26 ENCOUNTER — Other Ambulatory Visit: Payer: Self-pay

## 2024-07-26 MED ORDER — FLUCONAZOLE 100 MG PO TABS: 100.0000 mg | ORAL_TABLET | Freq: Every day | ORAL | 0 refills | Status: AC

## 2024-08-09 ENCOUNTER — Telehealth: Admitting: Nurse Practitioner

## 2024-08-09 ENCOUNTER — Encounter: Payer: Self-pay | Admitting: Nurse Practitioner

## 2024-08-09 VITALS — BP 128/80 | HR 101

## 2024-08-09 DIAGNOSIS — Z79899 Other long term (current) drug therapy: Secondary | ICD-10-CM | POA: Diagnosis not present

## 2024-08-09 DIAGNOSIS — E785 Hyperlipidemia, unspecified: Secondary | ICD-10-CM | POA: Diagnosis not present

## 2024-08-09 DIAGNOSIS — R053 Chronic cough: Secondary | ICD-10-CM

## 2024-08-09 DIAGNOSIS — E66813 Obesity, class 3: Secondary | ICD-10-CM | POA: Diagnosis not present

## 2024-08-09 DIAGNOSIS — R7303 Prediabetes: Secondary | ICD-10-CM

## 2024-08-09 DIAGNOSIS — I10 Essential (primary) hypertension: Secondary | ICD-10-CM | POA: Diagnosis not present

## 2024-08-09 DIAGNOSIS — E559 Vitamin D deficiency, unspecified: Secondary | ICD-10-CM

## 2024-08-09 DIAGNOSIS — Z6841 Body Mass Index (BMI) 40.0 and over, adult: Secondary | ICD-10-CM

## 2024-08-09 MED ORDER — ALBUTEROL SULFATE HFA 108 (90 BASE) MCG/ACT IN AERS: 2.0000 | INHALATION_SPRAY | Freq: Four times a day (QID) | RESPIRATORY_TRACT | 2 refills | Status: AC | PRN

## 2024-08-09 MED ORDER — METFORMIN HCL 1000 MG PO TABS: 1000.0000 mg | ORAL_TABLET | Freq: Every day | ORAL | 1 refills | Status: AC

## 2024-08-09 MED ORDER — OLMESARTAN MEDOXOMIL-HCTZ 40-25 MG PO TABS: 1.0000 | ORAL_TABLET | Freq: Every day | ORAL | 1 refills | Status: AC

## 2024-08-09 NOTE — Patient Instructions (Signed)
 Hypertension, Adult Hypertension is another name for high blood pressure. High blood pressure forces your heart to work harder to pump blood. This can cause problems over time. There are two numbers in a blood pressure reading. There is a top number (systolic) over a bottom number (diastolic). It is best to have a blood pressure that is below 120/80. What are the causes? The cause of this condition is not known. Some other conditions can lead to high blood pressure. What increases the risk? Some lifestyle factors can make you more likely to develop high blood pressure: Smoking. Not getting enough exercise or physical activity. Being overweight. Having too much fat, sugar, calories, or salt (sodium) in your diet. Drinking too much alcohol. Other risk factors include: Having any of these conditions: Heart disease. Diabetes. High cholesterol. Kidney disease. Obstructive sleep apnea. Having a family history of high blood pressure and high cholesterol. Age. The risk increases with age. Stress. What are the signs or symptoms? High blood pressure may not cause symptoms. Very high blood pressure (hypertensive crisis) may cause: Headache. Fast or uneven heartbeats (palpitations). Shortness of breath. Nosebleed. Vomiting or feeling like you may vomit (nauseous). Changes in how you see. Very bad chest pain. Feeling dizzy. Seizures. How is this treated? This condition is treated by making healthy lifestyle changes, such as: Eating healthy foods. Exercising more. Drinking less alcohol. Your doctor may prescribe medicine if lifestyle changes do not help enough and if: Your top number is above 130. Your bottom number is above 80. Your personal target blood pressure may vary. Follow these instructions at home: Eating and drinking  If told, follow the DASH eating plan. To follow this plan: Fill one half of your plate at each meal with fruits and vegetables. Fill one fourth of your plate  at each meal with whole grains. Whole grains include whole-wheat pasta, brown rice, and whole-grain bread. Eat or drink low-fat dairy products, such as skim milk or low-fat yogurt. Fill one fourth of your plate at each meal with low-fat (lean) proteins. Low-fat proteins include fish, chicken without skin, eggs, beans, and tofu. Avoid fatty meat, cured and processed meat, or chicken with skin. Avoid pre-made or processed food. Limit the amount of salt in your diet to less than 1,500 mg each day. Do not drink alcohol if: Your doctor tells you not to drink. You are pregnant, may be pregnant, or are planning to become pregnant. If you drink alcohol: Limit how much you have to: 0-1 drink a day for women. 0-2 drinks a day for men. Know how much alcohol is in your drink. In the [place] one drink equals one 12 oz bottle of beer (355 mL), one 5 oz glass of wine (148 mL), or one 1 oz glass of hard liquor (44 mL). Lifestyle  Work with your doctor to stay at a healthy weight or to lose weight. Ask your doctor what the best weight is for you. Get at least 30 minutes of exercise that causes your heart to beat faster (aerobic exercise) most days of the week. This may include walking, swimming, or biking. Get at least 30 minutes of exercise that strengthens your muscles (resistance exercise) at least 3 days a week. This may include lifting weights or doing Pilates. Do not smoke or use any products that contain nicotine or tobacco. If you need help quitting, ask your doctor. Check your blood pressure at home as told by your doctor. Keep all follow-up visits. Medicines Take over-the-counter and prescription medicines  only as told by your doctor. Follow directions carefully. Do not skip doses of blood pressure medicine. The medicine does not work as well if you skip doses. Skipping doses also puts you at risk for problems. Ask your doctor about side effects or reactions to medicines that you should watch  for. Contact a doctor if: You think you are having a reaction to the medicine you are taking. You have headaches that keep coming back. You feel dizzy. You have swelling in your ankles. You have trouble with your vision. Get help right away if: You get a very bad headache. You start to feel mixed up (confused). You feel weak or numb. You feel faint. You have very bad pain in your: Chest. Belly (abdomen). You vomit more than once. You have trouble breathing. These symptoms may be an emergency. Get help right away. Call 911. Do not wait to see if the symptoms will go away. Do not drive yourself to the hospital. Summary Hypertension is another name for high blood pressure. High blood pressure forces your heart to work harder to pump blood. For most people, a normal blood pressure is less than 120/80. Making healthy choices can help lower blood pressure. If your blood pressure does not get lower with healthy choices, you may need to take medicine. This information is not intended to replace advice given to you by your health care provider. Make sure you discuss any questions you have with your health care provider. Document Revised: 05/10/2021 Document Reviewed: 05/10/2021 Elsevier Patient Education  2024 [employer].

## 2024-08-09 NOTE — Assessment & Plan Note (Signed)
Will check lipid panel. No current medications.

## 2024-08-09 NOTE — Assessment & Plan Note (Signed)
 Will check vitamin D  level and supplement as needed.    Also encouraged to spend 15 minutes in the sun daily.

## 2024-08-09 NOTE — Assessment & Plan Note (Signed)
 She is to continue phentermine  37.5 mg no longer than April 1 she may follow-up she is in between insurances.  I have advised her to increase her physical activity by simply walking.  Also encouraged to use this time to regroup.

## 2024-08-09 NOTE — Progress Notes (Signed)
 "  Virtual Visit via Video Note  I,Jameka J Llittleton, CMA,acting as a scribe for Gaines Ada, FNP.,have documented all relevant documentation on the behalf of Gaines Ada, FNP,as directed by  Gaines Ada, FNP while in the presence of Gaines Ada, FNP.  I connected with [name] on 08/09/2024 at 11:00 AM EST by a video enabled telemedicine application and verified that I am speaking with the correct person using two identifiers.  Patient Location: Home Provider Location: Home Office  I discussed the limitations, risks, security, and privacy concerns of performing an evaluation and management service by video and the availability of in person appointments. I also discussed with the patient that there may be a patient responsible charge related to this service. The patient expressed understanding and agreed to proceed.  Subjective: PCP: Ada Gaines, FNP  Chief Complaint  Patient presents with   Hypertension    Patient presents today for a bpc. Patient reports compliance with her meds. Patient denies having any chest pain, sob or headaches at this time. Patient feels she may have had an anxiety attack last night.   Virtual visit to follow-up on blood pressure and weight check.  She will check her blood pressure when she gets home and settle.  She feels like it has been increased due to realizing that she is not going to work today after being laid off.  She is currently searching for a job and plans to hopefully start around March 1.  She continues to take phentermine  37.5 mg.  She has had a persistent cough since she was seen earlier part of October it is dry and hacking in nature.  With a small amount of clear to secretions at times.  Wt Readings from Last 3 Encounters: 07/08/24 : 251 lb (113.9 kg) 06/21/24 : 225 lb (102.1 kg) 05/31/24 : 255 lb 9.6 oz (115.9 kg)      ROS: Per HPI Current Medications[1]  Observations/Objective: Today's Vitals   Physical Exam Vitals  and nursing note reviewed.  Constitutional:      General: She is not in acute distress.    Appearance: Normal appearance. She is obese.  Pulmonary:     Effort: Pulmonary effort is normal. No respiratory distress.  Neurological:     General: No focal deficit present.     Mental Status: She is alert and oriented to person, place, and time.     Cranial Nerves: No cranial nerve deficit.  Psychiatric:        Mood and Affect: Mood normal.        Behavior: Behavior normal.        Thought Content: Thought content normal.        Judgment: Judgment normal.     Assessment and Plan: Essential hypertension Assessment & Plan: She will check her blood pressure when she gets home and send us  a message on MyChart to ensure that her blood pressure is not significantly elevated.  Refill of blood pressure medication sent to pharmacy.  Orders: -     CMP14+EGFR -     Olmesartan  Medoxomil-HCTZ; Take 1 tablet by mouth daily.  Dispense: 90 tablet; Refill: 1  Prediabetes Assessment & Plan: Hemoglobin A1c has increased slightly at last visit.  She is to continue metformin  750 MG oral daily with breakfast. Refill for metformin  sent to pharmacy.  Orders: -     metFORMIN  HCl; Take 1 tablet (1,000 mg total) by mouth daily with breakfast.  Dispense: 90 tablet; Refill: 1  Vitamin  D deficiency Assessment & Plan: Will check vitamin D  level and supplement as needed.    Also encouraged to spend 15 minutes in the sun daily.     Other long term (current) drug therapy -     CBC  Persistent cough -     Albuterol  Sulfate HFA; Inhale 2 puffs into the lungs every 6 (six) hours as needed for wheezing or shortness of breath.  Dispense: 8 g; Refill: 2  Elevated lipids Assessment & Plan: Will check lipid panel. No current medications.   Orders: -     Lipid panel  Class 3 severe obesity due to excess calories with body mass index (BMI) of 45.0 to 49.9 in adult, unspecified whether serious comorbidity present  [facility]) Assessment & Plan: She is to continue phentermine  37.5 mg no longer than April 1 she may follow-up she is in between insurances.  I have advised her to increase her physical activity by simply walking.  Also encouraged to use this time to regroup.    Follow Up Instructions: Return in about 4 months (around 12/07/2024) for bpc.   I discussed the assessment and treatment plan with the patient. The patient was provided an opportunity to ask questions, and all were answered. The patient agreed with the plan and demonstrated an understanding of the instructions.   The patient was advised to call back or seek an in-person evaluation if the symptoms worsen or if the condition fails to improve as anticipated.  The above assessment and management plan was discussed with the patient. The patient verbalized understanding of and has agreed to the management plan.   LILLETTE Gaines Ada, FNP, have reviewed all documentation for this visit. The documentation on 08/09/2024 for the exam, diagnosis, procedures, and orders are all accurate and complete.      [1]  Current Outpatient Medications:    albuterol  (VENTOLIN  HFA) 108 (90 Base) MCG/ACT inhaler, Inhale 2 puffs into the lungs every 6 (six) hours as needed for wheezing or shortness of breath., Disp: 8 g, Rfl: 2   benzonatate  (TESSALON ) 100 MG capsule, Take 1 capsule (100 mg total) by mouth 3 (three) times daily as needed for cough., Disp: 30 capsule, Rfl: 0   cetirizine  (ZYRTEC  ALLERGY) 10 MG tablet, Take 1 tablet (10 mg total) by mouth daily., Disp: 90 tablet, Rfl: 0   cromolyn  (NASALCROM ) 5.2 MG/ACT nasal spray, Place 1 spray into both nostrils daily., Disp: 26 mL, Rfl: 1   fluconazole  (DIFLUCAN ) 100 MG tablet, Take 1 tablet (100 mg total) by mouth daily. Take 1 tablet by mouth now repeat in 5 days, Disp: 2 tablet, Rfl: 0   ibuprofen  (ADVIL ) 800 MG tablet, Take 1 tablet (800 mg total) by mouth every 8 (eight) hours as needed., Disp: 30 tablet, Rfl: 0    lubiprostone  (AMITIZA ) 8 MCG capsule, Take 1 capsule (8 mcg total) by mouth daily with breakfast., Disp: 30 capsule, Rfl: 2   NUVARING 0.12-0.015 MG/24HR vaginal ring, Place 1 application vaginally every 30 (thirty) days., Disp: , Rfl: 11   phentermine  37.5 MG capsule, Take 1 capsule (37.5 mg total) by mouth every morning., Disp: 30 capsule, Rfl: 1   promethazine -dextromethorphan (PROMETHAZINE -DM) 6.25-15 MG/5ML syrup, Take 5 mLs by mouth 4 (four) times daily as needed for cough., Disp: 118 mL, Rfl: 0   traMADol  (ULTRAM ) 50 MG tablet, Take 1 tablet (50 mg total) by mouth every 6 (six) hours as needed for moderate pain (pain score 4-6) or severe pain (pain score 7-10)., Disp:  25 tablet, Rfl: 0   metFORMIN  (GLUCOPHAGE ) 1000 MG tablet, Take 1 tablet (1,000 mg total) by mouth daily with breakfast., Disp: 90 tablet, Rfl: 1   olmesartan -hydrochlorothiazide  (BENICAR  HCT) 40-25 MG tablet, Take 1 tablet by mouth daily., Disp: 90 tablet, Rfl: 1  "

## 2024-08-09 NOTE — Assessment & Plan Note (Signed)
 Hemoglobin A1c has increased slightly at last visit.  She is to continue metformin  750 MG oral daily with breakfast. Refill for metformin  sent to pharmacy.

## 2024-08-09 NOTE — Assessment & Plan Note (Signed)
 She will check her blood pressure when she gets home and send us  a message on MyChart to ensure that her blood pressure is not significantly elevated.  Refill of blood pressure medication sent to pharmacy.

## 2024-08-10 ENCOUNTER — Ambulatory Visit
Admission: RE | Admit: 2024-08-10 | Discharge: 2024-08-10 | Disposition: A | Source: Ambulatory Visit | Attending: Nurse Practitioner | Admitting: Nurse Practitioner

## 2024-08-10 ENCOUNTER — Ambulatory Visit: Admitting: Nurse Practitioner

## 2024-08-10 ENCOUNTER — Other Ambulatory Visit

## 2024-08-10 ENCOUNTER — Ambulatory Visit

## 2024-08-10 DIAGNOSIS — R928 Other abnormal and inconclusive findings on diagnostic imaging of breast: Secondary | ICD-10-CM

## 2024-08-11 LAB — LIPID PANEL
Chol/HDL Ratio: 2.8 ratio (ref 0.0–4.4)
Cholesterol, Total: 207 mg/dL — ABNORMAL HIGH (ref 100–199)
HDL: 73 mg/dL
LDL Chol Calc (NIH): 121 mg/dL — ABNORMAL HIGH (ref 0–99)
Triglycerides: 74 mg/dL (ref 0–149)
VLDL Cholesterol Cal: 13 mg/dL (ref 5–40)

## 2024-08-11 LAB — CMP14+EGFR
ALT: 10 IU/L (ref 0–32)
AST: 9 IU/L (ref 0–40)
Albumin: 4 g/dL (ref 3.9–4.9)
Alkaline Phosphatase: 89 IU/L (ref 41–116)
BUN/Creatinine Ratio: 10 (ref 9–23)
BUN: 8 mg/dL (ref 6–24)
Bilirubin Total: <0.2 mg/dL (ref 0.0–1.2)
CO2: 24 mmol/L (ref 20–29)
Calcium: 9.3 mg/dL (ref 8.7–10.2)
Chloride: 99 mmol/L (ref 96–106)
Creatinine, Ser: 0.78 mg/dL (ref 0.57–1.00)
Globulin, Total: 2.9 g/dL (ref 1.5–4.5)
Glucose: 74 mg/dL (ref 70–99)
Potassium: 3.8 mmol/L (ref 3.5–5.2)
Sodium: 136 mmol/L (ref 134–144)
Total Protein: 6.9 g/dL (ref 6.0–8.5)
eGFR: 94 mL/min/1.73

## 2024-08-11 LAB — CBC
Hematocrit: 38.5 % (ref 34.0–46.6)
Hemoglobin: 12.6 g/dL (ref 11.1–15.9)
MCH: 27.1 pg (ref 26.6–33.0)
MCHC: 32.7 g/dL (ref 31.5–35.7)
MCV: 83 fL (ref 79–97)
Platelets: 341 x10E3/uL (ref 150–450)
RBC: 4.65 x10E6/uL (ref 3.77–5.28)
RDW: 14 % (ref 11.7–15.4)
WBC: 7.9 x10E3/uL (ref 3.4–10.8)

## 2024-08-15 ENCOUNTER — Ambulatory Visit: Payer: Self-pay | Admitting: Nurse Practitioner

## 2024-11-29 ENCOUNTER — Ambulatory Visit: Payer: Self-pay | Admitting: Nurse Practitioner

## 2025-06-02 ENCOUNTER — Encounter: Payer: Self-pay | Admitting: Nurse Practitioner
